# Patient Record
Sex: Female | Born: 1990 | Race: White | Hispanic: No | Marital: Single | Smoking: Never smoker
Health system: Southern US, Community
[De-identification: ages and names within clinical notes are randomized; demographics above are authoritative.]

## PROBLEM LIST (undated history)

## (undated) DIAGNOSIS — T7840XA Allergy, unspecified, initial encounter: Secondary | ICD-10-CM

## (undated) DIAGNOSIS — K219 Gastro-esophageal reflux disease without esophagitis: Secondary | ICD-10-CM

## (undated) DIAGNOSIS — N390 Urinary tract infection, site not specified: Secondary | ICD-10-CM

## (undated) DIAGNOSIS — B019 Varicella without complication: Secondary | ICD-10-CM

## (undated) DIAGNOSIS — F32A Depression, unspecified: Secondary | ICD-10-CM

## (undated) DIAGNOSIS — G43109 Migraine with aura, not intractable, without status migrainosus: Secondary | ICD-10-CM

## (undated) DIAGNOSIS — J45909 Unspecified asthma, uncomplicated: Secondary | ICD-10-CM

## (undated) DIAGNOSIS — D509 Iron deficiency anemia, unspecified: Secondary | ICD-10-CM

## (undated) DIAGNOSIS — G610 Guillain-Barre syndrome: Secondary | ICD-10-CM

## (undated) DIAGNOSIS — R519 Headache, unspecified: Secondary | ICD-10-CM

## (undated) DIAGNOSIS — F419 Anxiety disorder, unspecified: Secondary | ICD-10-CM

## (undated) DIAGNOSIS — F5089 Other specified eating disorder: Secondary | ICD-10-CM

## (undated) HISTORY — DX: Headache, unspecified: R51.9

## (undated) HISTORY — DX: Gastro-esophageal reflux disease without esophagitis: K21.9

## (undated) HISTORY — PX: CHOLECYSTECTOMY: SHX55

## (undated) HISTORY — DX: Allergy, unspecified, initial encounter: T78.40XA

## (undated) HISTORY — DX: Migraine with aura, not intractable, without status migrainosus: G43.109

## (undated) HISTORY — DX: Depression, unspecified: F32.A

## (undated) HISTORY — PX: APPENDECTOMY: SHX54

## (undated) HISTORY — DX: Anxiety disorder, unspecified: F41.9

## (undated) HISTORY — PX: TONSILLECTOMY: SUR1361

## (undated) HISTORY — PX: WISDOM TOOTH EXTRACTION: SHX21

## (undated) HISTORY — DX: Urinary tract infection, site not specified: N39.0

## (undated) HISTORY — PX: OTHER SURGICAL HISTORY: SHX169

## (undated) HISTORY — DX: Varicella without complication: B01.9

---

## 2013-02-04 DIAGNOSIS — L309 Dermatitis, unspecified: Secondary | ICD-10-CM | POA: Insufficient documentation

## 2017-09-28 DIAGNOSIS — G610 Guillain-Barre syndrome: Secondary | ICD-10-CM | POA: Insufficient documentation

## 2017-10-15 DIAGNOSIS — J45901 Unspecified asthma with (acute) exacerbation: Secondary | ICD-10-CM

## 2017-10-15 DIAGNOSIS — J45909 Unspecified asthma, uncomplicated: Secondary | ICD-10-CM | POA: Insufficient documentation

## 2017-10-15 HISTORY — DX: Unspecified asthma with (acute) exacerbation: J45.901

## 2017-12-27 DIAGNOSIS — Z87798 Personal history of other (corrected) congenital malformations: Secondary | ICD-10-CM | POA: Insufficient documentation

## 2017-12-27 HISTORY — DX: Personal history of other (corrected) congenital malformations: Z87.798

## 2018-02-26 DIAGNOSIS — F988 Other specified behavioral and emotional disorders with onset usually occurring in childhood and adolescence: Secondary | ICD-10-CM | POA: Insufficient documentation

## 2018-03-17 DIAGNOSIS — F5083 Pica in adults: Secondary | ICD-10-CM

## 2018-03-17 DIAGNOSIS — F5089 Other specified eating disorder: Secondary | ICD-10-CM | POA: Insufficient documentation

## 2018-03-17 HISTORY — DX: Pica in adults: F50.83

## 2021-07-14 ENCOUNTER — Encounter (HOSPITAL_COMMUNITY): Payer: Self-pay

## 2021-07-14 ENCOUNTER — Emergency Department (HOSPITAL_COMMUNITY)
Admission: EM | Admit: 2021-07-14 | Discharge: 2021-07-15 | Disposition: A | Discharge status: Home / Self Care | Payer: Medicaid Other | Attending: Emergency Medicine | Admitting: Emergency Medicine

## 2021-07-14 ENCOUNTER — Other Ambulatory Visit: Payer: Self-pay

## 2021-07-14 DIAGNOSIS — J45909 Unspecified asthma, uncomplicated: Secondary | ICD-10-CM | POA: Diagnosis not present

## 2021-07-14 DIAGNOSIS — R531 Weakness: Secondary | ICD-10-CM | POA: Diagnosis not present

## 2021-07-14 DIAGNOSIS — R202 Paresthesia of skin: Secondary | ICD-10-CM | POA: Insufficient documentation

## 2021-07-14 DIAGNOSIS — R519 Headache, unspecified: Secondary | ICD-10-CM | POA: Insufficient documentation

## 2021-07-14 DIAGNOSIS — R42 Dizziness and giddiness: Secondary | ICD-10-CM | POA: Insufficient documentation

## 2021-07-14 HISTORY — DX: Guillain-Barre syndrome: G61.0

## 2021-07-14 HISTORY — DX: Unspecified asthma, uncomplicated: J45.909

## 2021-07-14 NOTE — ED Triage Notes (Signed)
Patient arrived with complaints of bilateral leg tingling, nausea, headache and dizziness since 5pm. States she started a new birth control today and is unsure if it is related.

## 2021-07-15 ENCOUNTER — Emergency Department (HOSPITAL_COMMUNITY): Payer: Medicaid Other

## 2021-07-15 LAB — COMPREHENSIVE METABOLIC PANEL
ALT: 18 U/L (ref 0–44)
AST: 16 U/L (ref 15–41)
Albumin: 4 g/dL (ref 3.5–5.0)
Alkaline Phosphatase: 51 U/L (ref 38–126)
Anion gap: 9 (ref 5–15)
BUN: 15 mg/dL (ref 6–20)
CO2: 25 mmol/L (ref 22–32)
Calcium: 9.5 mg/dL (ref 8.9–10.3)
Chloride: 102 mmol/L (ref 98–111)
Creatinine, Ser: 0.92 mg/dL (ref 0.44–1.00)
GFR, Estimated: >60 mL/min (ref >60)
Glucose, Bld: 102 mg/dL — ABNORMAL HIGH (ref 70–99)
Potassium: 3.6 mmol/L (ref 3.5–5.1)
Sodium: 136 mmol/L (ref 135–145)
Total Bilirubin: 0.4 mg/dL (ref 0.3–1.2)
Total Protein: 7.2 g/dL (ref 6.5–8.1)

## 2021-07-15 LAB — CBC WITH DIFFERENTIAL/PLATELET
Abs Immature Granulocytes: 0.03 10*3/uL (ref 0.00–0.07)
Basophils Absolute: 0 10*3/uL (ref 0.0–0.1)
Basophils Relative: 0 %
Eosinophils Absolute: 0.4 10*3/uL (ref 0.0–0.5)
Eosinophils Relative: 5 %
HCT: 42.2 % (ref 36.0–46.0)
Hemoglobin: 13.9 g/dL (ref 12.0–15.0)
Immature Granulocytes: 0 %
Lymphocytes Relative: 29 %
Lymphs Abs: 2.5 10*3/uL (ref 0.7–4.0)
MCH: 27.9 pg (ref 26.0–34.0)
MCHC: 32.9 g/dL (ref 30.0–36.0)
MCV: 84.6 fL (ref 80.0–100.0)
Monocytes Absolute: 0.6 10*3/uL (ref 0.1–1.0)
Monocytes Relative: 6 %
Neutro Abs: 5.2 10*3/uL (ref 1.7–7.7)
Neutrophils Relative %: 60 %
Platelets: 223 10*3/uL (ref 150–400)
RBC: 4.99 MIL/uL (ref 3.87–5.11)
RDW: 13.7 % (ref 11.5–15.5)
WBC: 8.8 10*3/uL (ref 4.0–10.5)
nRBC: 0 % (ref 0.0–0.2)

## 2021-07-15 LAB — I-STAT BETA HCG BLOOD, ED (MC, WL, AP ONLY): I-stat hCG, quantitative: <5 m[IU]/mL (ref <5)

## 2021-07-15 LAB — TROPONIN I (HIGH SENSITIVITY): Troponin I (High Sensitivity): <2 ng/L (ref <18)

## 2021-07-15 MED ORDER — DIPHENHYDRAMINE HCL 50 MG/ML IJ SOLN
25.0000 mg | Freq: Once | INTRAMUSCULAR | Status: AC
  Administered 2021-07-15: 25 mg via INTRAVENOUS
  Filled 2021-07-15: qty 1

## 2021-07-15 MED ORDER — METOCLOPRAMIDE HCL 5 MG/ML IJ SOLN
10.0000 mg | Freq: Once | INTRAMUSCULAR | Status: AC
  Administered 2021-07-15: 10 mg via INTRAVENOUS
  Filled 2021-07-15: qty 2

## 2021-07-15 MED ORDER — KETOROLAC TROMETHAMINE 30 MG/ML IJ SOLN
30.0000 mg | Freq: Once | INTRAMUSCULAR | Status: AC
  Administered 2021-07-15: 30 mg via INTRAVENOUS
  Filled 2021-07-15: qty 1

## 2021-07-15 NOTE — ED Provider Notes (Signed)
[facility]-EMERGENCY DEPT Provider Note   CSN: [id number] Arrival date & time: 07/14/21  2313     History Chief Complaint  Patient presents with   Dizziness    Holly Nielsen is a 30 y.o. female.  Patient with history of asthma, iron deficiency anemia, Guillian-Barre syndrome here with multiple complaints.  Reports he was well until 5 PM today when she developed a gradual onset posterior headache associate with nausea, room spinning dizziness and bilateral numbness and tingling in her legs.  States everything happened at once.  Denies any fall or injury.  Denies thunderclap onset.  Denies any fevers, chills, nausea or vomiting.  States her legs feel heavy and they both are tingling and numb all the way from her hip to her ankle.  No weakness in her arms.  No difficulty speaking or difficulty swallowing.  No chest pain or shortness of breath.  Had some blurry vision earlier but that has since improved.  She started a new birth control patch today and is not sure if this is related.  This does not feel like the last time she had Guillain-Barr syndrome. She denies any bowel or bladder incontinence.  No neck or back pain.  No fevers or chills.  No recent illnesses.  States everything was fine prior to 5 PM tonight. Denies any history of cancer or IV drug abuse.  The history is provided by the patient.  Dizziness Associated symptoms: headaches and weakness   Associated symptoms: no chest pain, no nausea, no shortness of breath and no vomiting       Past Medical History:  Diagnosis Date   Asthma    Guillain-Barre (HCC)     There are no problems to display for this patient.   History reviewed. No pertinent surgical history.   OB History   No obstetric history on file.     History reviewed. No pertinent family history.     Home Medications Prior to Admission medications   Not on File    Allergies    Patient has no known allergies.  Review of  Systems   Review of Systems  Constitutional:  Negative for activity change, appetite change, fatigue and fever.  HENT:  Negative for congestion and rhinorrhea.   Eyes:  Negative for photophobia and visual disturbance.  Respiratory:  Negative for cough, chest tightness and shortness of breath.   Cardiovascular:  Negative for chest pain.  Gastrointestinal:  Negative for abdominal pain, nausea and vomiting.  Genitourinary:  Negative for dysuria, flank pain and hematuria.  Musculoskeletal:  Positive for arthralgias and myalgias.  Skin:  Negative for wound.  Neurological:  Positive for dizziness, weakness, light-headedness, numbness and headaches.   all other systems are negative except as noted in the HPI and PMH.   Physical Exam Updated Vital Signs BP (!) 142/109 (BP Location: Left Arm)   Pulse 84   Temp 98.5 F (36.9 C) (Oral)   Resp 19   Ht 5\' 5"  (1.651 m)   Wt 108.9 kg   SpO2 96%   BMI 39.94 kg/m   Physical Exam Vitals and nursing note reviewed.  Constitutional:      General: She is not in acute distress.    Appearance: Normal appearance. She is well-developed and normal weight. She is not ill-appearing.  HENT:     Head: Normocephalic and atraumatic.     Mouth/Throat:     Mouth: Mucous membranes are moist.     Pharynx: No oropharyngeal exudate.  Eyes:     Conjunctiva/sclera: Conjunctivae normal.     Pupils: Pupils are equal, round, and reactive to light.     Comments: No nystagmus.  No ataxia on finger-to-nose  Neck:     Comments: No meningismus. Cardiovascular:     Rate and Rhythm: Normal rate and regular rhythm.     Heart sounds: Normal heart sounds. No murmur heard. Pulmonary:     Effort: Pulmonary effort is normal. No respiratory distress.     Breath sounds: Normal breath sounds.  Chest:     Chest wall: No tenderness.  Abdominal:     Palpations: Abdomen is soft.     Tenderness: There is no abdominal tenderness. There is no guarding or rebound.   Musculoskeletal:        General: No tenderness. Normal range of motion.     Cervical back: Normal range of motion and neck supple.     Comments: 5/5 strength in bilateral lower extremities. Ankle plantar and dorsiflexion intact. Great toe extension intact bilaterally. +2 DP and PT pulses. +2 patellar reflexes bilaterally. Normal gait. Subjective decreased sensation to the legs bilaterally  Skin:    General: Skin is warm.  Neurological:     Mental Status: She is alert and oriented to person, place, and time.     Cranial Nerves: No cranial nerve deficit.     Motor: No abnormal muscle tone.     Coordination: Coordination normal.     Comments:  5/5 strength throughout. CN 2-12 intact.Equal grip strength.   Psychiatric:        Behavior: Behavior normal.    ED Results / Procedures / Treatments   Labs (all labs ordered are listed, but only abnormal results are displayed) Labs Reviewed  COMPREHENSIVE METABOLIC PANEL - Abnormal; Notable for the following components:      Result Value   Glucose, Bld 102 (*)    All other components within normal limits  CBC WITH DIFFERENTIAL/PLATELET  I-STAT BETA HCG BLOOD, ED ([facility], [facility], [facility] ONLY)  TROPONIN I (HIGH SENSITIVITY)  TROPONIN I (HIGH SENSITIVITY)    EKG EKG Interpretation  Date/Time:  Monday July 15 2021 00:50:14 EDT Ventricular Rate:  90 PR Interval:  134 QRS Duration: 86 QT Interval:  398 QTC Calculation: 486 R Axis:   66 Text Interpretation: Normal sinus rhythm Nonspecific ST and T wave abnormality Prolonged QT Abnormal ECG No previous ECGs available Confirmed by Glynn Octave [phone]) on 07/15/2021 12:59:02 AM  Radiology CT Head Wo Contrast  Result Date: 07/15/2021 CLINICAL DATA:  Acute neurologic deficit, bilateral lower extremity paresthesia, headache EXAM: CT HEAD WITHOUT CONTRAST TECHNIQUE: Contiguous axial images were obtained from the base of the skull through the vertex without intravenous contrast. COMPARISON:  None.  FINDINGS: Brain: Normal anatomic configuration. No abnormal intra or extra-axial mass lesion or fluid collection. No abnormal mass effect or midline shift. No evidence of acute intracranial hemorrhage or infarct. Ventricular size is normal. Cerebellum unremarkable. Vascular: Unremarkable Skull: Intact Sinuses/Orbits: Paranasal sinuses are clear. Orbits are unremarkable. Other: Mastoid air cells and middle ear cavities are clear. IMPRESSION: No acute intracranial abnormality.  Normal exam. Electronically Signed   By: Helyn Numbers MD   On: 07/15/2021 00:34    Procedures Procedures   Medications Ordered in ED Medications  metoCLOPramide (REGLAN) injection 10 mg (has no administration in time range)  diphenhydrAMINE (BENADRYL) injection 25 mg (has no administration in time range)    ED Course  I have reviewed the triage vital signs and  the nursing notes.  Pertinent labs & imaging results that were available during my care of the patient were reviewed by me and considered in my medical decision making (see chart for details).    MDM Rules/Calculators/A&P                         Gradual onset headache associated with nausea, dizziness, bilateral numbness and tingling in her legs since 5 PM.  She has equal strength throughout without appreciable neurodeficits.  Equal strength, sensation, reflexes and pulses.  CT head is negative.  Headache is improved with migraine cocktail.  Low suspicion for subarachnoid hemorrhage, meningitis, temporal arteritis.  Bilateral tingling in legs discussed with neurology.  Discussed with Dr. Otelia Limes of neurology.  He states MS is a possibility.  Guillain Barre syndrome seems unlikely with good strength, reflexes and pulses.  He recommends MRI of her thoracic spine.  Does not need cervical spine without arm involvement. Does not need lumbar spine without perineal symptoms.   Patient declines transfer to [facility] for MRI.  She also declines waiting in the ED  until 7 AM for MRI dislocation.  States she feels ready to go home.  She understands she will be leaving against medical advice because acute neurological pathology such as Guillain-Barr syndrome or multiple sclerosis or other spinal cord pathology has not been ruled out. She is visiting from [place] and has a neurologist there.  States that she just wants to go home and is feeling better.  She declines waiting for MRI and states she will return if worse.  She has appears to have capacity to make this decision. Final Clinical Impression(s) / ED Diagnoses Final diagnoses:  Acute nonintractable headache, unspecified headache type  Bilateral leg paresthesia    Rx / DC Orders ED Discharge Orders     None        Appollonia Klee, Jeannett Senior, MD 07/15/21 [phone]

## 2021-07-15 NOTE — Discharge Instructions (Addendum)
You are leaving AGAINST MEDICAL ADVICE.  The neurologist recommended MRI today which you decided against.  Return to the ED if you wish to be reevaluated.  Follow-up with your neurologist.

## 2021-07-15 NOTE — ED Notes (Signed)
Family at bedside. 

## 2021-09-16 ENCOUNTER — Emergency Department (HOSPITAL_COMMUNITY)
Admission: EM | Admit: 2021-09-16 | Discharge: 2021-09-16 | Disposition: A | Discharge status: Home / Self Care | Payer: Medicaid Other | Attending: Emergency Medicine | Admitting: Emergency Medicine

## 2021-09-16 ENCOUNTER — Encounter (HOSPITAL_COMMUNITY): Payer: Self-pay | Admitting: Emergency Medicine

## 2021-09-16 ENCOUNTER — Other Ambulatory Visit: Payer: Self-pay

## 2021-09-16 ENCOUNTER — Emergency Department (HOSPITAL_COMMUNITY): Payer: Medicaid Other

## 2021-09-16 DIAGNOSIS — E041 Nontoxic single thyroid nodule: Secondary | ICD-10-CM

## 2021-09-16 DIAGNOSIS — N912 Amenorrhea, unspecified: Secondary | ICD-10-CM

## 2021-09-16 DIAGNOSIS — Z7951 Long term (current) use of inhaled steroids: Secondary | ICD-10-CM | POA: Insufficient documentation

## 2021-09-16 DIAGNOSIS — Z28310 Unvaccinated for covid-19: Secondary | ICD-10-CM | POA: Insufficient documentation

## 2021-09-16 DIAGNOSIS — F419 Anxiety disorder, unspecified: Secondary | ICD-10-CM | POA: Insufficient documentation

## 2021-09-16 DIAGNOSIS — J4541 Moderate persistent asthma with (acute) exacerbation: Secondary | ICD-10-CM | POA: Diagnosis not present

## 2021-09-16 DIAGNOSIS — F32A Depression, unspecified: Secondary | ICD-10-CM | POA: Insufficient documentation

## 2021-09-16 DIAGNOSIS — R0602 Shortness of breath: Secondary | ICD-10-CM | POA: Diagnosis present

## 2021-09-16 DIAGNOSIS — Z862 Personal history of diseases of the blood and blood-forming organs and certain disorders involving the immune mechanism: Secondary | ICD-10-CM | POA: Insufficient documentation

## 2021-09-16 HISTORY — DX: Nontoxic single thyroid nodule: E04.1

## 2021-09-16 HISTORY — DX: Amenorrhea, unspecified: N91.2

## 2021-09-16 MED ORDER — ALBUTEROL SULFATE HFA 108 (90 BASE) MCG/ACT IN AERS
2.0000 | INHALATION_SPRAY | Freq: Once | RESPIRATORY_TRACT | Status: AC
  Administered 2021-09-16: 2 via RESPIRATORY_TRACT

## 2021-09-16 MED ORDER — ALBUTEROL SULFATE HFA 108 (90 BASE) MCG/ACT IN AERS
2.0000 | INHALATION_SPRAY | Freq: Once | RESPIRATORY_TRACT | Status: DC
  Filled 2021-09-16: qty 6.7

## 2021-09-16 MED ORDER — METHYLPREDNISOLONE SODIUM SUCC 125 MG IJ SOLR
125.0000 mg | Freq: Once | INTRAMUSCULAR | Status: AC
  Administered 2021-09-16: 125 mg via INTRAVENOUS
  Filled 2021-09-16: qty 2

## 2021-09-16 MED ORDER — PREDNISONE 20 MG PO TABS: 20.0000 mg | ORAL_TABLET | Freq: Two times a day (BID) | ORAL | 0 refills | Status: DC

## 2021-09-16 NOTE — ED Notes (Signed)
D/c paperwork reviewed with pt, including prescriptions. Pt requesting work note, which was provided to her. No further needs expressed, ambulatory to ED exit.

## 2021-09-16 NOTE — Progress Notes (Signed)
[id number]/following for any medication assistance needed.

## 2021-09-16 NOTE — Discharge Instructions (Addendum)
Follow-up with the doctor of your choice as needed for problems.  Use the albuterol inhaler 2 puffs every 3-4 hours as needed for cough or trouble breathing.

## 2021-09-16 NOTE — ED Triage Notes (Signed)
Pt BIBA from home-  Pt c/o ShOB starting last night. Pt used home inhalers and breathing tx with no improvement.   EMS gave duoneb x2 PTA.  100% RA on arrival to ED.

## 2021-09-16 NOTE — ED Notes (Signed)
Pt provided [place] sandwich and soda, per her request. Updated to plan of care. No further needs expressed at this time.

## 2021-09-16 NOTE — ED Provider Notes (Signed)
[facility]-EMERGENCY DEPT Provider Note   CSN: [id number] Arrival date & time: 09/16/21  2500     History Chief Complaint  Patient presents with   Shortness of Breath    Holly Nielsen is a 30 y.o. female.  HPI She presents for evaluation of shortness of breath despite using usual inhalers, and nebulizer, at home.  She states she used her last dose of nebulizer and inhaler, prior to coming here.  She has recently moved to [place], from [place], and does not have insurance that she can use in [place].  She plans on going to [place] for a court date, in 3 days.  She denies fever, chills, chest pain, weakness or dizziness.  She has not had COVID vaccines because of her history of Guillain-Barr syndrome.  She states that she has had COVID, 2 different times.  There are no other known active modifying factors.     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   OB History   No obstetric history on file.     No family history on file.  Social History   Tobacco Use   Smoking status: Never   Smokeless tobacco: Never    Home Medications Prior to Admission medications   Medication Sig Start Date End Date Taking? Authorizing Provider  predniSONE (DELTASONE) 20 MG tablet Take 1 tablet (20 mg total) by mouth 2 (two) times daily. 09/16/21  Yes Mancel Bale, MD  albuterol (PROVENTIL) (2.5 MG/3ML) 0.083%  nebulizer solution Take 3 mLs by nebulization every 6 (six) hours. 07/08/21   [provider]  albuterol (VENTOLIN HFA) 108 (90 Base) MCG/ACT inhaler Inhale 2 puffs into the lungs every 6 (six) hours as needed for wheezing. 02/07/19   [provider]  EPINEPHrine 0.3 mg/0.3 mL IJ SOAJ injection Inject into the muscle daily. 07/08/21   [provider]  fluticasone-salmeterol (ADVAIR) 250-50 MCG/ACT AEPB Inhale 1 puff into the lungs in the morning and at bedtime.    [provider]  naproxen sodium (ALEVE) 220 MG tablet Take 220 mg by mouth daily as needed.    [provider]  PARoxetine (PAXIL-CR) 12.5 MG 24 hr tablet Take 12.5 mg by mouth every morning. 07/08/21   [provider]  Burr Medico 150-35 MCG/24HR transdermal patch Place 1 patch onto the skin once a week. Sunday 07/08/21   [provider]    Allergies    Celery oil, Corn oil, Iron sucrose, Lac bovis, Nutricap [actical], Onion, Other, Pedi-pre tape spray [wound dressing adhesive], and Vanilla  Review of Systems   Review of Systems  All other systems reviewed and are negative.  Physical Exam Updated Vital Signs BP 122/77   Pulse 87   Temp 98.3 F (36.8 C) (Oral)   Resp 17   Ht 5' (1.524 m)   Wt 113.4 kg   LMP 09/02/2021 (Exact Date)   SpO2 96%  BMI 48.82 kg/m   Physical Exam Vitals and nursing note reviewed.  Constitutional:      General: She is not in acute distress.    Appearance: She is well-developed. She is not ill-appearing, toxic-appearing or diaphoretic.  HENT:     Head: Normocephalic and atraumatic.     Nose: No congestion or rhinorrhea.     Mouth/Throat:     Pharynx: No oropharyngeal exudate or posterior oropharyngeal erythema.  Eyes:     Conjunctiva/sclera: Conjunctivae normal.     Pupils: Pupils are equal, round, and reactive to light.  Neck:     Trachea: Phonation normal.  Cardiovascular:     Rate and Rhythm: Normal rate and regular rhythm.   Pulmonary:     Effort: Pulmonary effort is normal. No respiratory distress.     Breath sounds: No stridor. Wheezing present.  Chest:     Chest wall: No tenderness.  Abdominal:     General: There is no distension.     Palpations: Abdomen is soft.     Tenderness: There is no abdominal tenderness. There is no guarding.  Musculoskeletal:        General: Normal range of motion.     Cervical back: Normal range of motion and neck supple.  Skin:    General: Skin is warm and dry.  Neurological:     Mental Status: She is alert and oriented to person, place, and time.     Motor: No abnormal muscle tone.  Psychiatric:        Mood and Affect: Mood normal.        Behavior: Behavior normal.        Thought Content: Thought content normal.        Judgment: Judgment normal.    ED Results / Procedures / Treatments   Labs (all labs ordered are listed, but only abnormal results are displayed) Labs Reviewed - No data to display  EKG EKG Interpretation  Date/Time:  Monday September 16 2021 07:44:45 EDT Ventricular Rate:  79 PR Interval:  139 QRS Duration: 107 QT Interval:  433 QTC Calculation: 497 R Axis:   13 Text Interpretation: Sinus rhythm Low voltage, precordial leads Borderline T abnormalities, diffuse leads Prolonged QT interval since last tracing no significant change Confirmed by Mancel Bale [phone]) on 09/16/2021 9:47:01 AM  Radiology DG Chest 2 View  Result Date: 09/16/2021 CLINICAL DATA:  Shortness of breath EXAM: CHEST - 2 VIEW COMPARISON:  None. FINDINGS: The cardiomediastinal silhouette is normal. There is mild central peribronchial thickening. There is no focal consolidation or pulmonary edema. There is no pleural effusion or pneumothorax. There is no acute osseous abnormality. IMPRESSION: Central peribronchial thickening can be seen with viral infection or reactive airway disease. No focal consolidation or pleural effusion. Electronically Signed   By: Lesia Hausen M.D.   On:  09/16/2021 08:17    Procedures Procedures   Medications Ordered in ED Medications  albuterol (VENTOLIN HFA) 108 (90 Base) MCG/ACT inhaler 2 puff (has no administration in time range)  methylPREDNISolone sodium succinate (SOLU-MEDROL) 125 mg/2 mL injection 125 mg (125 mg Intravenous Given 09/16/21 1027)  albuterol (VENTOLIN HFA) 108 (90 Base) MCG/ACT inhaler 2 puff (2 puffs Inhalation Given 09/16/21 1546)    ED Course  I have reviewed the triage vital signs and the nursing notes.  Pertinent labs & imaging results that were available during my care of the patient were reviewed by me and considered in my medical decision making (see chart for details).  MDM Rules/Calculators/A&P                            Patient Vitals for the past 24 hrs:  BP Temp Temp src Pulse Resp SpO2 Height Weight  09/16/21 1400 122/77 -- -- 87 17 96 % -- --  09/16/21 1300 124/72 -- -- 83 17 95 % -- --  09/16/21 1200 (!) 132/91 -- -- 76 16 99 % -- --  09/16/21 1100 125/87 -- -- 88 (!) 21 96 % -- --  09/16/21 0900 128/87 -- -- 77 14 99 % -- --  09/16/21 0745 128/82 98.3 F (36.8 C) Oral 76 13 97 % -- --  09/16/21 0739 -- -- -- -- -- -- 5' (1.524 m) 113.4 kg  09/16/21 0734 -- -- -- -- -- 100 % -- --    3:45 PM Reevaluation with update and discussion. After initial assessment and treatment, an updated evaluation reveals no further complaints she is comfortable.  Vital signs normal.  Patient was evaluated by social work for assistance with medication.  Unfortunately she does not meet criteria for medication assistance because she has support in [place].  Patient is encouraged to go back there for assistance with her medications.  Findings discussed and questions answered. Mancel Bale   Medical Decision Making:  This patient is presenting for evaluation of shortness of breath, which does require a range of treatment options, and is a complaint that involves a moderate risk of morbidity and mortality. The  differential diagnoses include asthma, bronchitis, pneumonia, viral. I decided to review old records, and in summary millage female with ongoing respiratory distress, and history of anemia.  She is challenged by lack of resources to buy medications and no insurance that she can use in the state of [place].  I did not require additional historical information from anyone.  Radiologic Tests Ordered, included chest x-ray.  I independently Visualized: Radiograph images, which show no infiltrate or edema  Cardiac Monitor Tracing which shows normal sinus rhythm     Critical Interventions-clinical evaluation, medication treatment, radiography, observation and reassessment.  TOC consultation.  After These Interventions, the Patient was reevaluated and was found stable for discharge.  No evidence for acute unstable respiratory disorder.  Chronic stable angina, with challenges getting medications due to living in a new state.  She has not required hospitalization.  Chest x-ray does not show pneumonia.  CRITICAL CARE-no Performed by: Mancel Bale  Nursing Notes Reviewed/ Care Coordinated Applicable Imaging Reviewed Interpretation of Laboratory Data incorporated into ED treatment  The patient appears reasonably screened and/or stabilized for discharge and I doubt any other medical condition or other [facility] requiring further screening, evaluation, or treatment in the ED at this time prior to discharge.  Plan: Home Medications-continue usual; Home Treatments-rest, fluids; return here if the recommended treatment, does not improve the symptoms; Recommended follow up-PCP, as needed     Final Clinical Impression(s) / ED Diagnoses Final diagnoses:  Moderate persistent asthma with exacerbation    Rx / DC Orders ED Discharge Orders          Ordered    predniSONE (DELTASONE) 20 MG tablet  2 times daily        09/16/21 1541             Mancel Bale, MD 09/16/21 1547

## 2021-11-11 ENCOUNTER — Emergency Department (HOSPITAL_COMMUNITY): Payer: Medicaid Other

## 2021-11-11 ENCOUNTER — Emergency Department (HOSPITAL_COMMUNITY)
Admission: EM | Admit: 2021-11-11 | Discharge: 2021-11-11 | Disposition: A | Discharge status: Home / Self Care | Payer: Medicaid Other | Attending: Emergency Medicine | Admitting: Emergency Medicine

## 2021-11-11 ENCOUNTER — Other Ambulatory Visit: Payer: Self-pay

## 2021-11-11 ENCOUNTER — Encounter (HOSPITAL_COMMUNITY): Payer: Self-pay | Admitting: Emergency Medicine

## 2021-11-11 DIAGNOSIS — R0789 Other chest pain: Secondary | ICD-10-CM | POA: Diagnosis not present

## 2021-11-11 DIAGNOSIS — J45909 Unspecified asthma, uncomplicated: Secondary | ICD-10-CM | POA: Insufficient documentation

## 2021-11-11 DIAGNOSIS — Z20822 Contact with and (suspected) exposure to covid-19: Secondary | ICD-10-CM | POA: Insufficient documentation

## 2021-11-11 DIAGNOSIS — Z7951 Long term (current) use of inhaled steroids: Secondary | ICD-10-CM | POA: Diagnosis not present

## 2021-11-11 DIAGNOSIS — R059 Cough, unspecified: Secondary | ICD-10-CM | POA: Insufficient documentation

## 2021-11-11 DIAGNOSIS — M791 Myalgia, unspecified site: Secondary | ICD-10-CM | POA: Diagnosis not present

## 2021-11-11 DIAGNOSIS — J069 Acute upper respiratory infection, unspecified: Secondary | ICD-10-CM

## 2021-11-11 DIAGNOSIS — R509 Fever, unspecified: Secondary | ICD-10-CM | POA: Diagnosis present

## 2021-11-11 LAB — RESP PANEL BY RT-PCR (FLU A&B, COVID) ARPGX2
Influenza A by PCR: NEGATIVE
Influenza B by PCR: NEGATIVE
SARS Coronavirus 2 by RT PCR: NEGATIVE

## 2021-11-11 MED ORDER — ALBUTEROL SULFATE HFA 108 (90 BASE) MCG/ACT IN AERS: 2.0000 | INHALATION_SPRAY | Freq: Four times a day (QID) | RESPIRATORY_TRACT | 0 refills | Status: DC | PRN

## 2021-11-11 NOTE — ED Provider Notes (Signed)
Emergency Medicine Provider Triage Evaluation Note  Holly Nielsen , a 30 y.o. female  was evaluated in triage.  Pt complains of muscle aches, cough, fever, chest congestion of 1 day duration.  Review of Systems  Positive: Muscles, cough, fever, chest congestion Negative: Chest pain, shortness of breath  Physical Exam  BP 129/90   Pulse 93   Temp 98.3 F (36.8 C) (Oral)   Resp 18   SpO2 94%  Gen:   Awake, no distress   Resp:  Normal effort  MSK:   Moves extremities without difficulty  Other:    Medical Decision Making  Medically screening exam initiated at 4:44 PM.  Appropriate orders placed.  Holly Nielsen was informed that the remainder of the evaluation will be completed by another provider, this initial triage assessment does not replace that evaluation, and the importance of remaining in the ED until their evaluation is complete.     Marita Kansas, PA-C 11/11/21 1645    Cathren Laine, MD 11/11/21 1659

## 2021-11-11 NOTE — ED Triage Notes (Signed)
Patient presents with shortness of breath, sore throat, cough, body aches, headache, dizziness and chills. Symptoms began last night.

## 2021-11-11 NOTE — Discharge Instructions (Addendum)
Your chest x-ray was without pneumonia.  Your respiratory panel was negative COVID, and flu.  Patient developed shortness of breath or worsening of your symptoms please return to the emergency room.

## 2021-11-11 NOTE — ED Provider Notes (Signed)
[facility]-EMERGENCY DEPT Provider Note   CSN: [id number] Arrival date & time: 11/11/21  1614     History No chief complaint on file.   Holly Nielsen is a 30 y.o. female.  30 year old female presents today for evaluation of 1 day duration of fever, cough, muscle aches, and chest congestion.  She has been taking Tylenol with good relief of her fever.  She is not nauseous or throwing up.  Has been tolerating p.o. intake without difficulty.  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  She is without other complaints at this time.  She does have asthma has not been wheezing.  However she does not have her asthma medications with her as they are in [place] and her grandma is mailing those to her.  The history is provided by the patient. No language interpreter was used.      Past Medical History:  Diagnosis Date  . Asthma   . Guillain-Barre [facility])     Patient Active Problem List   Diagnosis Date Noted  . Morbid obesity with body mass index of 40.0-49.9 (HCC) 09/16/2021  . Amenorrhea 09/16/2021  . Anxiety 09/16/2021  . Cyst of thyroid 09/16/2021  . History of iron deficiency anemia 09/16/2021  . 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  . ADD (attention deficit disorder) 02/26/2018  . H/O removal of thyroglossal duct cyst 12/27/2017  . Unspecified asthma with (acute) exacerbation 10/15/2017  . Guillain-Barre syndrome (HCC) 09/28/2017    History reviewed. No pertinent surgical history.   OB History   No obstetric history on file.     History reviewed. No pertinent family history.  Social History   Tobacco Use  . Smoking status: Never  . Smokeless tobacco: Never    Home Medications Prior to Admission medications   Medication Sig Start Date End Date Taking? Authorizing Provider  albuterol (PROVENTIL) (2.5 MG/3ML) 0.083% nebulizer solution Take 3 mLs by nebulization every 6 (six) hours. 07/08/21   [provider]   albuterol (VENTOLIN HFA) 108 (90 Base) MCG/ACT inhaler Inhale 2 puffs into the lungs every 6 (six) hours as needed for wheezing. 02/07/19   [provider]  EPINEPHrine 0.3 mg/0.3 mL IJ SOAJ injection Inject into the muscle daily. 07/08/21   [provider]  fluticasone-salmeterol (ADVAIR) 250-50 MCG/ACT AEPB Inhale 1 puff into the lungs in the morning and at bedtime.    [provider]  naproxen sodium (ALEVE) 220 MG tablet Take 220 mg by mouth daily as needed.    [provider]  PARoxetine (PAXIL-CR) 12.5 MG 24 hr tablet Take 12.5 mg by mouth every morning. 07/08/21   [provider]  predniSONE (DELTASONE) 20 MG tablet Take 1 tablet (20 mg total) by mouth 2 (two) times daily. 09/16/21   Mancel Bale, MD  Burr Medico 150-35 MCG/24HR transdermal patch Place 1 patch onto the skin once a week. Sunday 07/08/21   [provider]    Allergies    Celery oil, Corn oil, Iron sucrose, Lac bovis, Nutricap [actical], Onion, Other, Pedi-pre tape spray [wound dressing adhesive], and Vanilla  Review of Systems   Review of Systems  Constitutional:  Negative for activity change, chills and fever.  HENT:  Positive for congestion and sinus pain.   Respiratory:  Positive for cough. Negative for chest tightness and shortness of breath.   Cardiovascular:  Negative for chest pain.  Gastrointestinal:  Negative for abdominal pain, nausea and vomiting.  Genitourinary:  Negative for  decreased urine volume.  Musculoskeletal:  Positive for myalgias.  Neurological:  Negative for weakness and light-headedness.  All other systems reviewed and are negative.  Physical Exam Updated Vital Signs BP 106/82   Pulse 94   Temp 98.3 F (36.8 C) (Oral)   Resp 18   SpO2 94%   Physical Exam Vitals and nursing note reviewed.  Constitutional:      General: She is not in acute distress.    Appearance: Normal appearance. She is not ill-appearing.  HENT:     Head:  Normocephalic and atraumatic.     Nose: Nose normal.  Eyes:     Conjunctiva/sclera: Conjunctivae normal.  Cardiovascular:     Rate and Rhythm: Normal rate and regular rhythm.  Pulmonary:     Effort: Pulmonary effort is normal. No respiratory distress.     Breath sounds: Normal breath sounds. No wheezing or rales.  Abdominal:     General: There is no distension.     Tenderness: There is no abdominal tenderness. There is no guarding.  Musculoskeletal:        General: No deformity.  Skin:    Findings: No rash.  Neurological:     Mental Status: She is alert.    ED Results / Procedures / Treatments   Labs (all labs ordered are listed, but only abnormal results are displayed) Labs Reviewed  RESP PANEL BY RT-PCR (FLU A&B, COVID) ARPGX2    EKG None  Radiology DG Chest Portable 1 View  Result Date: 11/11/2021 CLINICAL DATA:  Productive cough and fever. EXAM: PORTABLE CHEST 1 VIEW COMPARISON:  Chest x-ray 09/16/2021. FINDINGS: The heart size and mediastinal contours are within normal limits. Both lungs are clear. The visualized skeletal structures are unremarkable. IMPRESSION: No active disease. Electronically Signed   By: Darliss Cheney M.D.   On: 11/11/2021 17:00    Procedures Procedures   Medications Ordered in ED Medications - No data to display  ED Course  I have reviewed the triage vital signs and the nursing notes.  Pertinent labs & imaging results that were available during my care of the patient were reviewed by me and considered in my medical decision making (see chart for details).    MDM Rules/Calculators/A&P                           30 year old female presents today for evaluation of flulike symptoms.  Patient's flu, COVID test was negative.  Patient sounds are clear to auscultation bilaterally.  Patient is appropriate for discharge.  We will send in albuterol inhaler for patient.  Return precautions discussed.  Patient voices understanding and is in agreement  with plan.  Final Clinical Impression(s) / ED Diagnoses Final diagnoses:  None    Rx / DC Orders ED Discharge Orders     None        Marita Kansas, PA-C 11/11/21 1807    Bethann Berkshire, MD 11/15/21 [phone]

## 2021-12-04 ENCOUNTER — Other Ambulatory Visit: Payer: Self-pay

## 2021-12-04 ENCOUNTER — Emergency Department (HOSPITAL_COMMUNITY): Payer: Medicaid Other

## 2021-12-04 ENCOUNTER — Emergency Department (HOSPITAL_COMMUNITY)
Admission: EM | Admit: 2021-12-04 | Discharge: 2021-12-04 | Disposition: A | Discharge status: Home / Self Care | Payer: Medicaid Other | Attending: Emergency Medicine | Admitting: Emergency Medicine

## 2021-12-04 ENCOUNTER — Encounter (HOSPITAL_COMMUNITY): Payer: Self-pay | Admitting: Oncology

## 2021-12-04 DIAGNOSIS — B349 Viral infection, unspecified: Secondary | ICD-10-CM | POA: Diagnosis not present

## 2021-12-04 DIAGNOSIS — J029 Acute pharyngitis, unspecified: Secondary | ICD-10-CM | POA: Diagnosis present

## 2021-12-04 DIAGNOSIS — J45909 Unspecified asthma, uncomplicated: Secondary | ICD-10-CM | POA: Diagnosis not present

## 2021-12-04 DIAGNOSIS — Z7951 Long term (current) use of inhaled steroids: Secondary | ICD-10-CM | POA: Diagnosis not present

## 2021-12-04 DIAGNOSIS — Z20822 Contact with and (suspected) exposure to covid-19: Secondary | ICD-10-CM | POA: Diagnosis not present

## 2021-12-04 LAB — RESP PANEL BY RT-PCR (FLU A&B, COVID) ARPGX2
Influenza A by PCR: NEGATIVE
Influenza B by PCR: NEGATIVE
SARS Coronavirus 2 by RT PCR: NEGATIVE

## 2021-12-04 NOTE — ED Provider Notes (Signed)
[facility]-EMERGENCY DEPT Provider Note   CSN: [id number] Arrival date & time: 12/04/21  5093     History Chief Complaint  Patient presents with   Shortness of Breath    Holly Nielsen is a 30 y.o. female.  The history is provided by the patient and medical records. No language interpreter was used.  Shortness of Breath  30 year old female significant history of asthma, obesity, Guillian-Barr syndrome who presents complaining of cold symptoms.  For the last 3 days patient has had chills, congestion, sore throat, productive cough, nausea, having some loose stools, and overall not feeling well.  She has been taking over-the-counter medication for her symptoms with some improvement.  She was at work today but her provider asked for her to go home.  She does use inhaler as needed but not more than usual.  She denies worsening shortness of breath.  Past Medical History:  Diagnosis Date   Asthma    Guillain-Barre [facility])     Patient Active Problem List   Diagnosis Date Noted   Morbid obesity with body mass index of 40.0-49.9 (HCC) 09/16/2021   Amenorrhea 09/16/2021   Anxiety 09/16/2021   Cyst of thyroid 09/16/2021   History of iron deficiency anemia 09/16/2021   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   ADD (attention deficit disorder) 02/26/2018   H/O removal of thyroglossal duct cyst 12/27/2017   Unspecified asthma with (acute) exacerbation 10/15/2017   Guillain-Barre syndrome (HCC) 09/28/2017    History reviewed. No pertinent surgical history.   OB History   No obstetric history on file.     No family history on file.  Social History   Tobacco Use   Smoking status: Never   Smokeless tobacco: Never  Vaping Use   Vaping Use: Never used  Substance Use Topics   Alcohol use: Not Currently   Drug use: Not Currently    Home Medications Prior to Admission medications   Medication Sig Start Date End Date Taking? Authorizing Provider  albuterol (PROVENTIL)  (2.5 MG/3ML) 0.083% nebulizer solution Take 3 mLs by nebulization every 6 (six) hours. 07/08/21   [provider]  albuterol (VENTOLIN HFA) 108 (90 Base) MCG/ACT inhaler Inhale 2 puffs into the lungs every 6 (six) hours as needed for wheezing. 11/11/21   Marita Kansas, PA-C  EPINEPHrine 0.3 mg/0.3 mL IJ SOAJ injection Inject into the muscle daily. 07/08/21   [provider]  fluticasone-salmeterol (ADVAIR) 250-50 MCG/ACT AEPB Inhale 1 puff into the lungs in the morning and at bedtime.    [provider]  naproxen sodium (ALEVE) 220 MG tablet Take 220 mg by mouth daily as needed.    [provider]  PARoxetine (PAXIL-CR) 12.5 MG 24 hr tablet Take 12.5 mg by mouth every morning. 07/08/21   [provider]  predniSONE (DELTASONE) 20 MG tablet Take 1 tablet (20 mg total) by mouth 2 (two) times daily. 09/16/21   Mancel Bale, MD  Burr Medico 150-35 MCG/24HR transdermal patch Place 1 patch onto the skin once a week. Sunday 07/08/21   [provider]    Allergies    Celery oil, Corn oil, Iron sucrose, Lac bovis, Nutricap [actical], Onion, Other, Pedi-pre tape spray [wound dressing adhesive], and Vanilla  Review of Systems   Review of Systems  Respiratory:  Positive for shortness of breath.   All other systems reviewed and are negative.  Physical Exam Updated Vital Signs BP (!) 157/97 (BP Location: Left Arm)   Pulse 94   Temp 97.8 F (  36.6 C) (Oral)   Resp 18   Ht 5' (1.524 m)   Wt 108.9 kg   LMP 11/28/2021 (Exact Date)   SpO2 94%   BMI 46.87 kg/m   Physical Exam Vitals and nursing note reviewed.  Constitutional:      General: She is not in acute distress.    Appearance: She is well-developed. She is obese.  HENT:     Head: Atraumatic.     Mouth/Throat:     Mouth: Mucous membranes are moist.  Eyes:     Conjunctiva/sclera: Conjunctivae normal.  Cardiovascular:     Rate and Rhythm: Normal rate and regular rhythm.  Pulmonary:     Effort:  Pulmonary effort is normal.  Abdominal:     General: Abdomen is flat.     Palpations: Abdomen is soft.     Tenderness: There is no abdominal tenderness.  Musculoskeletal:     Cervical back: Normal range of motion and neck supple.  Skin:    Findings: No rash.  Neurological:     Mental Status: She is alert.  Psychiatric:        Mood and Affect: Mood normal.    ED Results / Procedures / Treatments   Labs (all labs ordered are listed, but only abnormal results are displayed) Labs Reviewed  RESP PANEL BY RT-PCR (FLU A&B, COVID) ARPGX2    EKG None  Radiology DG Chest 2 View  Result Date: 12/04/2021 CLINICAL DATA:  Cough, body aches, fever, chills EXAM: CHEST - 2 VIEW COMPARISON:  Chest radiograph 11/11/2021 FINDINGS: The cardiomediastinal silhouette is stable. There is no focal consolidation or pulmonary edema. There is no pleural effusion or pneumothorax There is no acute osseous abnormality. IMPRESSION: No radiographic evidence of acute cardiopulmonary process. Electronically Signed   By: Lesia Hausen M.D.   On: 12/04/2021 10:13    Procedures Procedures   Medications Ordered in ED Medications - No data to display  ED Course  I have reviewed the triage vital signs and the nursing notes.  Pertinent labs & imaging results that were available during my care of the patient were reviewed by me and considered in my medical decision making (see chart for details).    MDM Rules/Calculators/A&P                           BP (!) 157/97 (BP Location: Left Arm)   Pulse 94   Temp 97.8 F (36.6 C) (Oral)   Resp 18   Ht 5' (1.524 m)   Wt 108.9 kg   LMP 11/28/2021 (Exact Date)   SpO2 94%   BMI 46.87 kg/m   Final Clinical Impression(s) / ED Diagnoses Final diagnoses:  Viral illness    Rx / DC Orders ED Discharge Orders     None      11:27 AM Patient here with cold symptoms.  Patient test negative for COVID and flu chest x-ray without any acute abnormality.  Recommend  symptomatic managements of her flulike symptoms.  But otherwise patient stable for discharge.  Holly Nielsen was evaluated in Emergency Department on 12/04/2021 for the symptoms described in the history of present illness. She was evaluated in the context of the global COVID-19 pandemic, which necessitated consideration that the patient might be at risk for infection with the SARS-CoV-2 virus that causes COVID-19. Institutional protocols and algorithms that pertain to the evaluation of patients at risk for COVID-19 are in a state of rapid change based  on information released by regulatory bodies including the CDC and federal and state organizations. These policies and algorithms were followed during the patient's care in the ED.    Fayrene Helper, PA-C 12/04/21 1129    Vanetta Mulders, MD 12/05/21 1124

## 2021-12-04 NOTE — ED Triage Notes (Signed)
Pt reports shob, cough, generalized body aches, fever, chills x several days.  Pt states she had similar sx last month got better and, "It came back with a vengeance."  Pt speaking in full sentences.

## 2021-12-04 NOTE — ED Notes (Signed)
An After Visit Summary was printed and given to the patient. Discharge instructions given and no further questions at this time.  

## 2021-12-04 NOTE — Discharge Instructions (Addendum)
Your symptoms are likely due to a viral illness.  Continue taking over-the-counter medication as needed for management of your symptoms.  Return if you develop significant shortness of breath or if you have other concern.

## 2022-01-05 ENCOUNTER — Encounter (HOSPITAL_COMMUNITY): Payer: Self-pay

## 2022-01-05 ENCOUNTER — Emergency Department (HOSPITAL_COMMUNITY): Payer: Medicaid Other

## 2022-01-05 ENCOUNTER — Emergency Department (HOSPITAL_COMMUNITY)
Admission: EM | Admit: 2022-01-05 | Discharge: 2022-01-06 | Disposition: A | Discharge status: Home / Self Care | Payer: Medicaid Other | Attending: Emergency Medicine | Admitting: Emergency Medicine

## 2022-01-05 ENCOUNTER — Other Ambulatory Visit: Payer: Self-pay

## 2022-01-05 DIAGNOSIS — J45901 Unspecified asthma with (acute) exacerbation: Secondary | ICD-10-CM

## 2022-01-05 DIAGNOSIS — U071 COVID-19: Secondary | ICD-10-CM | POA: Insufficient documentation

## 2022-01-05 DIAGNOSIS — J4521 Mild intermittent asthma with (acute) exacerbation: Secondary | ICD-10-CM | POA: Diagnosis not present

## 2022-01-05 DIAGNOSIS — R519 Headache, unspecified: Secondary | ICD-10-CM | POA: Diagnosis present

## 2022-01-05 LAB — RESP PANEL BY RT-PCR (FLU A&B, COVID) ARPGX2
Influenza A by PCR: NEGATIVE
Influenza B by PCR: NEGATIVE
SARS Coronavirus 2 by RT PCR: POSITIVE — AB

## 2022-01-05 MED ORDER — IPRATROPIUM-ALBUTEROL 0.5-2.5 (3) MG/3ML IN SOLN
3.0000 mL | Freq: Once | RESPIRATORY_TRACT | Status: AC
  Administered 2022-01-05: 3 mL via RESPIRATORY_TRACT
  Filled 2022-01-05: qty 3

## 2022-01-05 MED ORDER — LACTATED RINGERS IV BOLUS
1000.0000 mL | Freq: Once | INTRAVENOUS | Status: AC
  Administered 2022-01-05: 1000 mL via INTRAVENOUS

## 2022-01-05 MED ORDER — METOCLOPRAMIDE HCL 5 MG/ML IJ SOLN
10.0000 mg | Freq: Once | INTRAMUSCULAR | Status: AC
  Administered 2022-01-05: 10 mg via INTRAVENOUS
  Filled 2022-01-05: qty 2

## 2022-01-05 MED ORDER — DIPHENHYDRAMINE HCL 50 MG/ML IJ SOLN
25.0000 mg | Freq: Once | INTRAMUSCULAR | Status: AC
  Administered 2022-01-05: 25 mg via INTRAVENOUS
  Filled 2022-01-05: qty 1

## 2022-01-05 MED ORDER — ACETAMINOPHEN 500 MG PO TABS
1000.0000 mg | ORAL_TABLET | Freq: Once | ORAL | Status: AC
  Administered 2022-01-05: 1000 mg via ORAL
  Filled 2022-01-05: qty 2

## 2022-01-05 NOTE — ED Provider Notes (Signed)
[facility]   CSN: [id number] Arrival date & time: 01/05/22  2206     History  Chief Complaint  Patient presents with   Headache   Chills   Nausea   Cough   Wheezing    Holly Nielsen is a 31 y.o. female.   Headache Associated symptoms: cough   Cough Associated symptoms: headaches and wheezing   Wheezing Associated symptoms: cough and headaches    31 year old female with a history of asthma who presents to the emergency department with a cough, headache, chills, nausea and wheezing that has been present for 1 day.  The patient states that her significant other was also sick with URI symptoms.  She does not have an inhaler at home currently.  She states that symptoms have been present for the past 1 day.  She denies any chest pain at this time.  She is tolerating oral intake.    Home Medications Prior to Admission medications   Medication Sig Start Date End Date Taking? Authorizing Provider  albuterol (PROVENTIL) (2.5 MG/3ML) 0.083% nebulizer solution Take 3 mLs by nebulization every 6 (six) hours as needed for shortness of breath. 07/08/21  Yes [provider]  albuterol (VENTOLIN HFA) 108 (90 Base) MCG/ACT inhaler Inhale 2 puffs into the lungs every 6 (six) hours as needed for wheezing. 11/11/21  Yes Karie Mainland, Amjad, PA-C  methylPREDNISolone (MEDROL DOSEPAK) 4 MG TBPK tablet Take as instructed on the package 01/06/22  Yes Ernie Avena, MD  naproxen sodium (ALEVE) 220 MG tablet Take 220 mg by mouth daily as needed (for pain).   Yes [provider]  PARoxetine (PAXIL-CR) 12.5 MG 24 hr tablet Take 12.5 mg by mouth every morning. 07/08/21  Yes [provider]  EPINEPHrine 0.3 mg/0.3 mL IJ SOAJ injection Inject into the muscle daily. Patient not taking: Reported on 01/06/2022 07/08/21   [provider]      Allergies    Celery oil, Corn oil, Iron sucrose, Lac bovis, Nutricap [actical], Onion, Other,  Pedi-pre tape spray [wound dressing adhesive], and Vanilla    Review of Systems   Review of Systems  Respiratory:  Positive for cough and wheezing.   Neurological:  Positive for headaches.   Physical Exam Updated Vital Signs BP (!) 125/99    Pulse 94    Temp 100 F (37.8 C) (Oral)    Resp (!) 22    Ht 5' (1.524 m)    Wt 108.9 kg    LMP 01/02/2022 (Approximate)    SpO2 95%    BMI 46.87 kg/m  Physical Exam Vitals and nursing note reviewed.  Constitutional:      General: She is not in acute distress.    Appearance: She is well-developed.  HENT:     Head: Normocephalic and atraumatic.  Eyes:     Conjunctiva/sclera: Conjunctivae normal.     Pupils: Pupils are equal, round, and reactive to light.  Cardiovascular:     Rate and Rhythm: Normal rate and regular rhythm.     Heart sounds: No murmur heard. Pulmonary:     Effort: Pulmonary effort is normal. No respiratory distress.     Breath sounds: Stridor present. Wheezing present.  Abdominal:     General: There is no distension.     Palpations: Abdomen is soft.     Tenderness: There is no abdominal tenderness. There is no guarding.  Musculoskeletal:        General: No swelling, deformity or signs of injury.  Cervical back: Neck supple.  Skin:    General: Skin is warm and dry.     Capillary Refill: Capillary refill takes less than 2 seconds.     Findings: No lesion or rash.  Neurological:     General: No focal deficit present.     Mental Status: She is alert. Mental status is at baseline.  Psychiatric:        Mood and Affect: Mood normal.    ED Results / Procedures / Treatments   Labs (all labs ordered are listed, but only abnormal results are displayed) Labs Reviewed  RESP PANEL BY RT-PCR (FLU A&B, COVID) ARPGX2 - Abnormal; Notable for the following components:      Result Value   SARS Coronavirus 2 by RT PCR POSITIVE (*)    All other components within normal limits  BLOOD GAS, VENOUS  COOXEMETRY PANEL    EKG EKG  Interpretation  Date/Time:  Sunday January 05 2022 22:28:00 EST Ventricular Rate:  112 PR Interval:  129 QRS Duration: 105 QT Interval:  358 QTC Calculation: 489 R Axis:   58 Text Interpretation: Sinus tachycardia Low voltage, precordial leads Borderline repolarization abnormality Borderline prolonged QT interval Confirmed by Ernie AvenaLawsing, Noelle Hoogland ([id number]) on 01/05/2022 11:00:26 PM  Radiology DG Chest Portable 1 View  Result Date: 01/05/2022 CLINICAL DATA:  Cough.  Headache, chills, nausea.  Wheezing. EXAM: PORTABLE CHEST 1 VIEW COMPARISON:  12/04/2021 FINDINGS: Patient's chin obscures the apices.The cardiomediastinal contours are normal. Mild peribronchial thickening. Pulmonary vasculature is normal. No consolidation, pleural effusion, or pneumothorax. No acute osseous abnormalities are seen. IMPRESSION: Mild peribronchial thickening suggesting bronchitis or asthma. Electronically Signed   By: Narda RutherfordMelanie  Sanford M.D.   On: 01/05/2022 22:31    Procedures Procedures    Medications Ordered in ED Medications  ipratropium-albuterol (DUONEB) 0.5-2.5 (3) MG/3ML nebulizer solution 3 mL (3 mLs Nebulization Given 01/05/22 2334)  metoCLOPramide (REGLAN) injection 10 mg (10 mg Intravenous Given 01/05/22 2341)  diphenhydrAMINE (BENADRYL) injection 25 mg (25 mg Intravenous Given 01/05/22 2342)  acetaminophen (TYLENOL) tablet 1,000 mg (1,000 mg Oral Given 01/05/22 2332)  lactated ringers bolus 1,000 mL (0 mLs Intravenous Stopped 01/06/22 0123)  albuterol (VENTOLIN HFA) 108 (90 Base) MCG/ACT inhaler 2 puff (2 puffs Inhalation Given 01/06/22 0036)    ED Course/ Medical Decision Making/ A&P Clinical Course as of 01/06/22 0225  Sun Jan 05, 2022  2259 EMS staff notified us upon patient's arrival that the car monoxide detector in their truck was going off.  There is concern that patient might have been exposed to carbon monoxide. [LJ]  2358 SARS Coronavirus 2 by RT PCR(!): POSITIVE [JL]    Clinical Course User Index [JL]  Ernie AvenaLawsing, Paige Vanderwoude, MD [LJ] Placido SouJoldersma, Logan, PA-C                           Medical Decision Making  31 year old female with a history of asthma who presents to the emergency department with a cough, headache, chills, nausea and wheezing that has been present for 1 day.  The patient states that her significant other was also sick with URI symptoms.  She does not have an inhaler at home currently.  She states that symptoms have been present for the past 1 day.  She denies any chest pain at this time.  She is tolerating oral intake.    On arrival, the patient was afebrile, temperature 100, mildly tachycardic P1 16, not tachypneic, saturating 98% on room air, hemodynamically  stable.  Cardiac telemetry revealed sinus tachycardia with subsequent improvement to normal sinus rhythm after volume resuscitation.  Patient had mild expiratory wheezing on exam. Suspect likely viral upper respiratory infection, influenza or COVID-19 triggering a mild asthma exacerbation.  The patient was treated with a DuoNeb inhaler in the emergency department.  A chest x-ray was performed revealed mild peribronchial thickening suggesting bronchitis or asthma per the radiology read.  I reviewed the chest x-ray and agree.  No focal consolidation to suggest pneumonia.  EMS provide additional history and stated that upon patient's arrival their truck car monoxide detector went off.  There was some concern that the patient had carbon oxide exposure.  Due to this, cooximetry and VBG was performed which ultimately resulted normal.  The patient did undergo PCR testing for COVID-19 which resulted positive which likely explains the patient's symptoms.  She was ambulated in the emergency department with no desaturations on pulse oximetry noted.  Given the patient's wheezing and history of asthma, she was reassessed and found to be with lungs that were clear to auscultation.  She was administered IV fluid bolus and had an improvement in her sinus  tachycardia.  She is tolerating oral intake.  Overall feel that the patient is stable for discharge at this time.  The patient was provided with an inhaler for discharge.  Her headache improved with a migraine cocktail.  Symptoms of her headache were described as tension type in the setting of COVID-19.  She has no neurologic deficits.  I do not think further testing is warranted for further evaluation at this time.  Overall feel the patient is stable for continued outpatient management of her COVID symptoms.  Will prescribe a short course of steroids given her history of asthma and concern for mild asthma exacerbation today.  Advised that the patient isolate at home for the next 5 days.    Final Clinical Impression(s) / ED Diagnoses Final diagnoses:  COVID-19  Mild asthma with exacerbation, unspecified whether persistent    Rx / DC Orders ED Discharge Orders          Ordered    methylPREDNISolone (MEDROL DOSEPAK) 4 MG TBPK tablet        01/06/22 0028              Ernie Avena, MD 01/06/22 [id number]

## 2022-01-05 NOTE — ED Notes (Signed)
Pt ambulatory from triage rm to bathroom w/o assistance and with steady gait

## 2022-01-05 NOTE — ED Triage Notes (Signed)
Pt BIB EMS. Pt reports with headache, chills, nausea, cough, and wheezing since this morning.

## 2022-01-06 LAB — BLOOD GAS, VENOUS
Acid-Base Excess: 2 mmol/L (ref 0.0–2.0)
Bicarbonate: 27.4 mmol/L (ref 20.0–28.0)
O2 Saturation: 62.8 %
Patient temperature: 98.6
pCO2, Ven: 48.1 mmHg (ref 44.0–60.0)
pH, Ven: 7.374 (ref 7.250–7.430)
pO2, Ven: 34.9 mmHg (ref 32.0–45.0)

## 2022-01-06 LAB — COOXEMETRY PANEL
Carboxyhemoglobin: 1.3 % (ref 0.5–1.5)
Methemoglobin: 1.1 % (ref 0.0–1.5)
O2 Saturation: 79.7 %
Total hemoglobin: 14.4 g/dL (ref 12.0–16.0)

## 2022-01-06 MED ORDER — METHYLPREDNISOLONE 4 MG PO TBPK: ORAL_TABLET | ORAL | 0 refills | Status: DC

## 2022-01-06 MED ORDER — ALBUTEROL SULFATE HFA 108 (90 BASE) MCG/ACT IN AERS
2.0000 | INHALATION_SPRAY | Freq: Once | RESPIRATORY_TRACT | Status: AC
Start: 2022-01-06 — End: 2022-01-06
  Administered 2022-01-06: 2 via RESPIRATORY_TRACT
  Filled 2022-01-06: qty 6.7

## 2022-01-06 NOTE — ED Notes (Signed)
Pt ambulated roughly 110ft with pulse ox. Pt maintained O2 saturation of 97%. Pt did complain of SOB but not any worse than when being sedentary.

## 2022-01-06 NOTE — Discharge Instructions (Addendum)
You were evaluated in the Emergency Department and after careful evaluation, we did not find any emergent condition requiring admission or further testing in the hospital.  Your exam/testing today was overall reassuring.  You tested positive which is also likely triggering a mild asthma exacerbation.  Recommend you isolate at home for the next 5 days.  Your boyfriend should also be tested as he likely also has COVID.  Please return to the Emergency Department if you experience any worsening of your condition.  Thank you for allowing [place] to be a part of your care.

## 2022-03-13 ENCOUNTER — Emergency Department (HOSPITAL_COMMUNITY): Payer: Medicaid Other

## 2022-03-13 ENCOUNTER — Other Ambulatory Visit: Payer: Self-pay

## 2022-03-13 ENCOUNTER — Observation Stay (HOSPITAL_COMMUNITY)
Admission: EM | Admit: 2022-03-13 | Discharge: 2022-03-14 | Disposition: A | Discharge status: Home / Self Care | Payer: Medicaid Other | Attending: Student | Admitting: Student

## 2022-03-13 ENCOUNTER — Encounter (HOSPITAL_COMMUNITY): Payer: Self-pay | Admitting: Emergency Medicine

## 2022-03-13 DIAGNOSIS — R0602 Shortness of breath: Secondary | ICD-10-CM | POA: Diagnosis present

## 2022-03-13 DIAGNOSIS — J4551 Severe persistent asthma with (acute) exacerbation: Secondary | ICD-10-CM | POA: Diagnosis present

## 2022-03-13 DIAGNOSIS — J45901 Unspecified asthma with (acute) exacerbation: Secondary | ICD-10-CM | POA: Diagnosis not present

## 2022-03-13 DIAGNOSIS — J4550 Severe persistent asthma, uncomplicated: Secondary | ICD-10-CM

## 2022-03-13 DIAGNOSIS — Z20822 Contact with and (suspected) exposure to covid-19: Secondary | ICD-10-CM | POA: Diagnosis not present

## 2022-03-13 DIAGNOSIS — J4541 Moderate persistent asthma with (acute) exacerbation: Secondary | ICD-10-CM | POA: Diagnosis not present

## 2022-03-13 DIAGNOSIS — F32A Depression, unspecified: Secondary | ICD-10-CM

## 2022-03-13 HISTORY — DX: Severe persistent asthma, uncomplicated: J45.50

## 2022-03-13 LAB — COMPREHENSIVE METABOLIC PANEL
ALT: 18 U/L (ref 0–44)
AST: 15 U/L (ref 15–41)
Albumin: 3.9 g/dL (ref 3.5–5.0)
Alkaline Phosphatase: 50 U/L (ref 38–126)
Anion gap: 10 (ref 5–15)
BUN: 14 mg/dL (ref 6–20)
CO2: 25 mmol/L (ref 22–32)
Calcium: 8.9 mg/dL (ref 8.9–10.3)
Chloride: 102 mmol/L (ref 98–111)
Creatinine, Ser: 0.85 mg/dL (ref 0.44–1.00)
GFR, Estimated: >60 mL/min (ref >60)
Glucose, Bld: 90 mg/dL (ref 70–99)
Potassium: 3.9 mmol/L (ref 3.5–5.1)
Sodium: 137 mmol/L (ref 135–145)
Total Bilirubin: 0.2 mg/dL — ABNORMAL LOW (ref 0.3–1.2)
Total Protein: 7.1 g/dL (ref 6.5–8.1)

## 2022-03-13 LAB — CBC
HCT: 41.2 % (ref 36.0–46.0)
Hemoglobin: 13.4 g/dL (ref 12.0–15.0)
MCH: 27.5 pg (ref 26.0–34.0)
MCHC: 32.5 g/dL (ref 30.0–36.0)
MCV: 84.6 fL (ref 80.0–100.0)
Platelets: 224 10*3/uL (ref 150–400)
RBC: 4.87 MIL/uL (ref 3.87–5.11)
RDW: 13.4 % (ref 11.5–15.5)
WBC: 8.3 10*3/uL (ref 4.0–10.5)
nRBC: 0 % (ref 0.0–0.2)

## 2022-03-13 LAB — BASIC METABOLIC PANEL
Anion gap: 9 (ref 5–15)
BUN: 13 mg/dL (ref 6–20)
CO2: 24 mmol/L (ref 22–32)
Calcium: 8.8 mg/dL — ABNORMAL LOW (ref 8.9–10.3)
Chloride: 104 mmol/L (ref 98–111)
Creatinine, Ser: 0.96 mg/dL (ref 0.44–1.00)
GFR, Estimated: >60 mL/min (ref >60)
Glucose, Bld: 141 mg/dL — ABNORMAL HIGH (ref 70–99)
Potassium: 3.5 mmol/L (ref 3.5–5.1)
Sodium: 137 mmol/L (ref 135–145)

## 2022-03-13 LAB — RESP PANEL BY RT-PCR (FLU A&B, COVID) ARPGX2
Influenza A by PCR: NEGATIVE
Influenza B by PCR: NEGATIVE
SARS Coronavirus 2 by RT PCR: NEGATIVE

## 2022-03-13 LAB — BLOOD GAS, VENOUS
Acid-Base Excess: 4.2 mmol/L — ABNORMAL HIGH (ref 0.0–2.0)
Bicarbonate: 30.3 mmol/L — ABNORMAL HIGH (ref 20.0–28.0)
O2 Saturation: 79.7 %
Patient temperature: 37
pCO2, Ven: 50 mmHg (ref 44–60)
pH, Ven: 7.39 (ref 7.25–7.43)
pO2, Ven: 45 mmHg (ref 32–45)

## 2022-03-13 LAB — CBC WITH DIFFERENTIAL/PLATELET
Abs Immature Granulocytes: 0.03 10*3/uL (ref 0.00–0.07)
Basophils Absolute: 0 10*3/uL (ref 0.0–0.1)
Basophils Relative: 1 %
Eosinophils Absolute: 0.4 10*3/uL (ref 0.0–0.5)
Eosinophils Relative: 5 %
HCT: 39.5 % (ref 36.0–46.0)
Hemoglobin: 12.8 g/dL (ref 12.0–15.0)
Immature Granulocytes: 0 %
Lymphocytes Relative: 27 %
Lymphs Abs: 2.4 10*3/uL (ref 0.7–4.0)
MCH: 27.2 pg (ref 26.0–34.0)
MCHC: 32.4 g/dL (ref 30.0–36.0)
MCV: 84 fL (ref 80.0–100.0)
Monocytes Absolute: 0.7 10*3/uL (ref 0.1–1.0)
Monocytes Relative: 8 %
Neutro Abs: 5.3 10*3/uL (ref 1.7–7.7)
Neutrophils Relative %: 59 %
Platelets: 231 10*3/uL (ref 150–400)
RBC: 4.7 MIL/uL (ref 3.87–5.11)
RDW: 13.4 % (ref 11.5–15.5)
WBC: 8.8 10*3/uL (ref 4.0–10.5)
nRBC: 0 % (ref 0.0–0.2)

## 2022-03-13 LAB — HCG, QUANTITATIVE, PREGNANCY: hCG, Beta Chain, Quant, S: <1 m[IU]/mL (ref <5)

## 2022-03-13 LAB — TROPONIN I (HIGH SENSITIVITY): Troponin I (High Sensitivity): <2 ng/L (ref <18)

## 2022-03-13 MED ORDER — IPRATROPIUM-ALBUTEROL 0.5-2.5 (3) MG/3ML IN SOLN
6.0000 mL | Freq: Once | RESPIRATORY_TRACT | Status: AC
  Administered 2022-03-13: 6 mL via RESPIRATORY_TRACT
  Filled 2022-03-13: qty 3

## 2022-03-13 MED ORDER — IPRATROPIUM BROMIDE 0.02 % IN SOLN
0.5000 mg | RESPIRATORY_TRACT | Status: DC
  Administered 2022-03-13 – 2022-03-14 (×2): 0.5 mg via RESPIRATORY_TRACT
  Filled 2022-03-13 (×2): qty 2.5

## 2022-03-13 MED ORDER — IPRATROPIUM BROMIDE 0.02 % IN SOLN
0.5000 mg | Freq: Once | RESPIRATORY_TRACT | Status: AC
Start: 2022-03-13 — End: 2022-03-13
  Administered 2022-03-13: 0.5 mg via RESPIRATORY_TRACT
  Filled 2022-03-13: qty 2.5

## 2022-03-13 MED ORDER — ACETAMINOPHEN 650 MG RE SUPP
650.0000 mg | Freq: Four times a day (QID) | RECTAL | Status: DC | PRN
Start: 2022-03-13 — End: 2022-03-14

## 2022-03-13 MED ORDER — ALBUTEROL SULFATE (2.5 MG/3ML) 0.083% IN NEBU
2.5000 mg | INHALATION_SOLUTION | RESPIRATORY_TRACT | Status: DC
  Administered 2022-03-13 – 2022-03-14 (×2): 2.5 mg via RESPIRATORY_TRACT
  Filled 2022-03-13 (×2): qty 3

## 2022-03-13 MED ORDER — PAROXETINE HCL ER 12.5 MG PO TB24
12.5000 mg | ORAL_TABLET | Freq: Every morning | ORAL | Status: DC
  Administered 2022-03-14: 12.5 mg via ORAL
  Filled 2022-03-13: qty 1

## 2022-03-13 MED ORDER — ALBUTEROL SULFATE (2.5 MG/3ML) 0.083% IN NEBU
7.5000 mg/h | INHALATION_SOLUTION | Freq: Once | RESPIRATORY_TRACT | Status: AC
  Administered 2022-03-13: 7.5 mg/h via RESPIRATORY_TRACT
  Filled 2022-03-13: qty 3

## 2022-03-13 MED ORDER — ENOXAPARIN SODIUM 60 MG/0.6ML IJ SOSY
55.0000 mg | PREFILLED_SYRINGE | INTRAMUSCULAR | Status: DC
  Administered 2022-03-13: 55 mg via SUBCUTANEOUS
  Filled 2022-03-13: qty 0.6
  Filled 2022-03-13: qty 0.55

## 2022-03-13 MED ORDER — LACTATED RINGERS IV BOLUS
1000.0000 mL | Freq: Once | INTRAVENOUS | Status: AC
  Administered 2022-03-13: 1000 mL via INTRAVENOUS

## 2022-03-13 MED ORDER — METHYLPREDNISOLONE SODIUM SUCC 125 MG IJ SOLR
125.0000 mg | Freq: Once | INTRAMUSCULAR | Status: AC
  Administered 2022-03-13: 125 mg via INTRAVENOUS
  Filled 2022-03-13: qty 2

## 2022-03-13 MED ORDER — BUDESONIDE 0.25 MG/2ML IN SUSP
0.2500 mg | Freq: Two times a day (BID) | RESPIRATORY_TRACT | Status: DC
  Administered 2022-03-14: 0.25 mg via RESPIRATORY_TRACT
  Filled 2022-03-13: qty 2

## 2022-03-13 MED ORDER — METHYLPREDNISOLONE SODIUM SUCC 40 MG IJ SOLR
40.0000 mg | Freq: Two times a day (BID) | INTRAMUSCULAR | Status: DC
  Administered 2022-03-14: 40 mg via INTRAVENOUS
  Filled 2022-03-13: qty 1

## 2022-03-13 MED ORDER — MAGNESIUM SULFATE IN D5W 1-5 GM/100ML-% IV SOLN
1.0000 g | Freq: Once | INTRAVENOUS | Status: AC
  Administered 2022-03-13: 1 g via INTRAVENOUS
  Filled 2022-03-13: qty 100

## 2022-03-13 MED ORDER — ACETAMINOPHEN 325 MG PO TABS: 650.0000 mg | ORAL_TABLET | Freq: Four times a day (QID) | ORAL | Status: DC | PRN

## 2022-03-13 MED ORDER — ALBUTEROL SULFATE (2.5 MG/3ML) 0.083% IN NEBU: 2.5000 mg | INHALATION_SOLUTION | RESPIRATORY_TRACT | Status: DC | PRN

## 2022-03-13 NOTE — ED Notes (Signed)
ED TO INPATIENT HANDOFF REPORT  ED Nurse Name and Phone #: Louie Casa Name/Age/Gender Holly Nielsen 31 y.o. female Room/Bed: WA19/WA19  Code Status   Code Status: Not on file  Home/SNF/Other Home Patient oriented to: self, place, time, and situation Is this baseline? Yes   Triage Complete: Triage complete  Chief Complaint Asthma exacerbation [J45.901]  Triage Note Reports increased SOB since last night, has tried breathing treatments at home w/o relief. Had a fever of 100.4 last night, cough, increased WOB, was unable to lay flat last night. Dyspnea at rest, worse w/ exertion.    Allergies Allergies  Allergen Reactions   Celery Oil Diarrhea   Corn Oil Diarrhea    rash   Iron Sucrose     Other reaction(s): Dizziness, Other (See Comments), Other - See Comments Pt became flushed, hot and diaphoretic after completion of infusion.  Pt became flushed, hot and diaphoretic after completion of infusion.     Lac Bovis Diarrhea   Nutricap [Actical] Diarrhea   Onion Diarrhea   Other Nausea And Vomiting    Green bell peppers   Pedi-Pre Tape Spray [Wound Dressing Adhesive] Hives   Vanilla Diarrhea    Level of Care/Admitting Diagnosis ED Disposition     ED Disposition  Admit   Condition  --   Comment  Hospital Area: [facility] [[id number]]  Level of Care: Telemetry [5]  Admit to tele based on following criteria: Monitor for Ischemic changes  May place patient in observation at [facility] or [facility] if equivalent level of care is available:: No  Covid Evaluation: Confirmed COVID Negative  Diagnosis: Asthma exacerbation [161096]  Admitting Physician: Eduard Clos [id number]  Attending Physician: Eduard Clos [phone]          B Medical/Surgery History Past Medical History:  Diagnosis Date   Asthma    Guillain-Barre (HCC)    History reviewed. No pertinent surgical history.   A IV Location/Drains/Wounds Patient  Lines/Drains/Airways Status     Active Line/Drains/Airways     Name Placement date Placement time Site Days   Peripheral IV 03/13/22 20 G Right Antecubital 03/13/22  1838  Antecubital  less than 1            Intake/Output Last 24 hours No intake or output data in the 24 hours ending 03/13/22 2112  Labs/Imaging Results for orders placed or performed during the hospital encounter of 03/13/22 (from the past 48 hour(s))  Comprehensive metabolic panel     Status: Abnormal   Collection Time: 03/13/22  6:40 PM  Result Value Ref Range   Sodium 137 135 - 145 mmol/L   Potassium 3.9 3.5 - 5.1 mmol/L   Chloride 102 98 - 111 mmol/L   CO2 25 22 - 32 mmol/L   Glucose, Bld 90 70 - 99 mg/dL    Comment: Glucose reference range applies only to samples taken after fasting for at least 8 hours.   BUN 14 6 - 20 mg/dL   Creatinine, Ser 1.91 0.44 - 1.00 mg/dL   Calcium 8.9 8.9 - 47.8 mg/dL   Total Protein 7.1 6.5 - 8.1 g/dL   Albumin 3.9 3.5 - 5.0 g/dL   AST 15 15 - 41 U/L   ALT 18 0 - 44 U/L   Alkaline Phosphatase 50 38 - 126 U/L   Total Bilirubin 0.2 (L) 0.3 - 1.2 mg/dL   GFR, Estimated >29 >56 mL/min    Comment: (NOTE) Calculated using the CKD-EPI Creatinine  Equation (2021)    Anion gap 10 5 - 15    Comment: Performed at [facility], [address] [place], [place] [address]  CBC with Differential     Status: None   Collection Time: 03/13/22  6:40 PM  Result Value Ref Range   WBC 8.8 4.0 - 10.5 K/uL   RBC 4.70 3.87 - 5.11 MIL/uL   Hemoglobin 12.8 12.0 - 15.0 g/dL   HCT 60.4 54.0 - 98.1 %   MCV 84.0 80.0 - 100.0 fL   MCH 27.2 26.0 - 34.0 pg   MCHC 32.4 30.0 - 36.0 g/dL   RDW 19.1 47.8 - 29.5 %   Platelets 231 150 - 400 K/uL   nRBC 0.0 0.0 - 0.2 %   Neutrophils Relative % 59 %   Neutro Abs 5.3 1.7 - 7.7 K/uL   Lymphocytes Relative 27 %   Lymphs Abs 2.4 0.7 - 4.0 K/uL   Monocytes Relative 8 %   Monocytes Absolute 0.7 0.1 - 1.0 K/uL   Eosinophils Relative 5 %    Eosinophils Absolute 0.4 0.0 - 0.5 K/uL   Basophils Relative 1 %   Basophils Absolute 0.0 0.0 - 0.1 K/uL   Immature Granulocytes 0 %   Abs Immature Granulocytes 0.03 0.00 - 0.07 K/uL    Comment: Performed at [facility], [address], [place], [place] [address]  Troponin I (High Sensitivity)     Status: None   Collection Time: 03/13/22  6:40 PM  Result Value Ref Range   Troponin I (High Sensitivity) <2 <18 ng/L    Comment: (NOTE) Elevated high sensitivity troponin I (hsTnI) values and significant  changes across serial measurements may suggest ACS but many other  chronic and acute conditions are known to elevate hsTnI results.  Refer to the "Links" section for chest pain algorithms and additional  guidance. Performed at [facility], [address] [place], [place] [address]   Resp Panel by RT-PCR (Flu A&B, Covid) Nasopharyngeal Swab     Status: None   Collection Time: 03/13/22  6:40 PM   Specimen: Nasopharyngeal Swab; Nasopharyngeal(NP) swabs in vial transport medium  Result Value Ref Range   SARS Coronavirus 2 by RT PCR NEGATIVE NEGATIVE    Comment: (NOTE) SARS-CoV-2 target nucleic acids are NOT DETECTED.  The SARS-CoV-2 RNA is generally detectable in upper respiratory specimens during the acute phase of infection. The lowest concentration of SARS-CoV-2 viral copies this assay can detect is 138 copies/mL. A negative result does not preclude SARS-Cov-2 infection and should not be used as the sole basis for treatment or other patient management decisions. A negative result may occur with  improper specimen collection/handling, submission of specimen other than nasopharyngeal swab, presence of viral mutation(s) within the areas targeted by this assay, and inadequate number of viral copies(<138 copies/mL). A negative result must be combined with clinical observations, patient history, and epidemiological information. The expected result  is Negative.  Fact Sheet for Patients:  BloggerCourse.com  Fact Sheet for Healthcare Providers:  SeriousBroker.it  This test is no t yet approved or cleared by the [place] FDA and  has been authorized for detection and/or diagnosis of SARS-CoV-2 by FDA under an Emergency Use Authorization (EUA). This EUA will remain  in effect (meaning this test can be used) for the duration of the COVID-19 declaration under Section 564(b)(1) of the Act, 21 U.S.C.section 360bbb-3(b)(1), unless the authorization is terminated  or revoked sooner.  Influenza A by PCR NEGATIVE NEGATIVE   Influenza B by PCR NEGATIVE NEGATIVE    Comment: (NOTE) The Xpert Xpress SARS-CoV-2/FLU/RSV plus assay is intended as an aid in the diagnosis of influenza from Nasopharyngeal swab specimens and should not be used as a sole basis for treatment. Nasal washings and aspirates are unacceptable for Xpert Xpress SARS-CoV-2/FLU/RSV testing.  Fact Sheet for Patients: BloggerCourse.com  Fact Sheet for Healthcare Providers: SeriousBroker.it  This test is not yet approved or cleared by the [place] FDA and has been authorized for detection and/or diagnosis of SARS-CoV-2 by FDA under an Emergency Use Authorization (EUA). This EUA will remain in effect (meaning this test can be used) for the duration of the COVID-19 declaration under Section 564(b)(1) of the Act, 21 U.S.C. section 360bbb-3(b)(1), unless the authorization is terminated or revoked.  Performed at [facility], [address], [place], [place] [address]   Blood gas, venous (at [facility] and [facility], not at [facility])     Status: Abnormal   Collection Time: 03/13/22  6:40 PM  Result Value Ref Range   pH, Ven 7.39 7.25 - 7.43   pCO2, Ven 50 44 - 60 mmHg   pO2, Ven 45 32 - 45 mmHg   Bicarbonate 30.3 (H) 20.0 - 28.0 mmol/L   Acid-Base Excess  4.2 (H) 0.0 - 2.0 mmol/L   O2 Saturation 79.7 %   Patient temperature 37.0     Comment: Performed at [facility], [address] [place], [place] [address]   DG Chest 2 View  Result Date: 03/13/2022 CLINICAL DATA:  Shortness of breath. EXAM: CHEST - 2 VIEW COMPARISON:  Chest x-ray 01/05/2022 FINDINGS: The heart size and mediastinal contours are within normal limits. Both lungs are clear. The visualized skeletal structures are unremarkable. IMPRESSION: No active cardiopulmonary disease. Electronically Signed   By: Darliss Cheney M.D.   On: 03/13/2022 18:05    Pending Labs Unresulted Labs (From admission, onward)    None       Vitals/Pain Today's Vitals   03/13/22 1930 03/13/22 2006 03/13/22 2034 03/13/22 2035  BP: (!) 138/91  (!) 149/76   Pulse: 71 85 92   Resp: 19 18 (!) 21   Temp:      TempSrc:      SpO2: 96% 98% 100%   Weight:      Height:      PainSc:    0-No pain    Isolation Precautions No active isolations  Medications Medications  ipratropium-albuterol (DUONEB) 0.5-2.5 (3) MG/3ML nebulizer solution 6 mL (6 mLs Nebulization Given 03/13/22 1839)  methylPREDNISolone sodium succinate (SOLU-MEDROL) 125 mg/2 mL injection 125 mg (125 mg Intravenous Given 03/13/22 1840)  magnesium sulfate IVPB 1 g 100 mL (0 g Intravenous Stopped 03/13/22 1945)  lactated ringers bolus 1,000 mL (1,000 mLs Intravenous New Bag/Given 03/13/22 1945)  albuterol (PROVENTIL) (2.5 MG/3ML) 0.083% nebulizer solution (7.5 mg/hr Nebulization Given 03/13/22 2004)  ipratropium (ATROVENT) nebulizer solution 0.5 mg (0.5 mg Nebulization Given 03/13/22 2004)    Mobility walks Low fall risk   Focused Assessments Pulmonary Assessment Handoff:  Lung sounds: Bilateral Breath Sounds: Inspiratory wheezes, Expiratory wheezes L Breath Sounds: Inspiratory wheezes, Expiratory wheezes R Breath Sounds: Inspiratory wheezes, Expiratory wheezes O2 Device: Room Air      R Recommendations: See  Admitting Provider Note  Report given to:   Additional Notes: -

## 2022-03-13 NOTE — ED Provider Notes (Signed)
[facility]-EMERGENCY DEPT Provider Note  CSN: [id number] Arrival date & time: 03/13/22 1658  Chief Complaint(s) Shortness of Breath  HPI Holly Nielsen is a 31 y.o. female with PMH asthma, GBS who presents the emergency department for evaluation of shortness of breath.  Patient states that she has had some mild worsening shortness of breath over the last 72 hours but over the last 24 hours it is acutely worsened.  She states that she used her nebulizer treatments at home but is having persistent shortness of breath bringing her to the emergency department.  She also endorses associated chest pain primarily with coughing but denies diaphoresis, nausea, vomiting, headache, fever or other systemic symptoms.   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 Authorizing Provider  albuterol (PROVENTIL) (2.5 MG/3ML) 0.083% nebulizer solution Take 3 mLs by nebulization every 6 (six) hours as needed for shortness of breath. 07/08/21   [provider]  albuterol (VENTOLIN HFA) 108 (90 Base) MCG/ACT inhaler Inhale 2 puffs into the lungs every 6 (six) hours as needed for wheezing. 11/11/21   Marita Kansas, PA-C  EPINEPHrine 0.3 mg/0.3 mL IJ SOAJ injection Inject into the muscle  daily. Patient not taking: Reported on 01/06/2022 07/08/21   [provider]  methylPREDNISolone (MEDROL DOSEPAK) 4 MG TBPK tablet Take as instructed on the package 01/06/22   Ernie Avena, MD  naproxen sodium (ALEVE) 220 MG tablet Take 220 mg by mouth daily as needed (for pain).    [provider]  PARoxetine (PAXIL-CR) 12.5 MG 24 hr tablet Take 12.5 mg by mouth every morning. 07/08/21   [provider]                                                                                                                                    Past Surgical History History reviewed. No pertinent surgical history. Family History No family history on file.  Social History Social History   Tobacco Use   Smoking status: Never   Smokeless tobacco: Never  Vaping Use   Vaping Use: Never used  Substance Use Topics   Alcohol use: Not Currently   Drug use: Not Currently   Allergies Celery oil, Corn oil, Iron sucrose,  Lac bovis, Nutricap [actical], Onion, Other, Pedi-pre tape spray [wound dressing adhesive], and Vanilla  Review of Systems Review of Systems  Respiratory:  Positive for cough, chest tightness, shortness of breath and wheezing.   Cardiovascular:  Positive for chest pain.   Physical Exam Vital Signs  I have reviewed the triage vital signs BP (!) 134/104 (BP Location: Left Arm)   Pulse 83   Temp 98 F (36.7 C) (Oral)   Resp 20   Ht 5' (1.524 m)   Wt 109 kg   SpO2 100%   BMI 46.93 kg/m   Physical Exam Vitals and nursing note reviewed.  Constitutional:      General: She is not in acute distress.    Appearance: She is well-developed.  HENT:     Head: Normocephalic and atraumatic.  Eyes:     Conjunctiva/sclera: Conjunctivae normal.  Cardiovascular:     Rate and Rhythm: Normal rate and regular rhythm.     Heart sounds: No murmur heard. Pulmonary:     Effort: Tachypnea, accessory muscle usage and respiratory distress present.     Breath sounds:  Wheezing present.  Abdominal:     Palpations: Abdomen is soft.     Tenderness: There is no abdominal tenderness.  Musculoskeletal:        General: No swelling.     Cervical back: Neck supple.  Skin:    General: Skin is warm and dry.     Capillary Refill: Capillary refill takes less than 2 seconds.  Neurological:     Mental Status: She is alert.  Psychiatric:        Mood and Affect: Mood normal.    ED Results and Treatments Labs (all labs ordered are listed, but only abnormal results are displayed) Labs Reviewed  COMPREHENSIVE METABOLIC PANEL - Abnormal; Notable for the following components:      Result Value   Total Bilirubin 0.2 (*)    All other components within normal limits  BLOOD GAS, VENOUS - Abnormal; Notable for the following components:   Bicarbonate 30.3 (*)    Acid-Base Excess 4.2 (*)    All other components within normal limits  RESP PANEL BY RT-PCR (FLU A&B, COVID) ARPGX2  CBC WITH DIFFERENTIAL/PLATELET  TROPONIN I (HIGH SENSITIVITY)                                                                                                                          Radiology DG Chest 2 View  Result Date: 03/13/2022 CLINICAL DATA:  Shortness of breath. EXAM: CHEST - 2 VIEW COMPARISON:  Chest x-ray 01/05/2022 FINDINGS: The heart size and mediastinal contours are within normal limits. Both lungs are clear. The visualized skeletal structures are unremarkable. IMPRESSION: No active cardiopulmonary disease. Electronically Signed   By: Darliss Cheney M.D.   On: 03/13/2022 18:05    Pertinent labs & imaging results that were available during my care of the patient were reviewed by me and considered in my medical decision  making (see MDM for details).  Medications Ordered in ED Medications  magnesium sulfate IVPB 1 g 100 mL (1 g Intravenous New Bag/Given 03/13/22 1840)  lactated ringers bolus 1,000 mL (has no administration in time range)  albuterol (PROVENTIL,VENTOLIN) solution  continuous neb (has no administration in time range)  ipratropium (ATROVENT) nebulizer solution 0.5 mg (has no administration in time range)  ipratropium-albuterol (DUONEB) 0.5-2.5 (3) MG/3ML nebulizer solution 6 mL (6 mLs Nebulization Given 03/13/22 1839)  methylPREDNISolone sodium succinate (SOLU-MEDROL) 125 mg/2 mL injection 125 mg (125 mg Intravenous Given 03/13/22 1840)                                                                                                                                     Procedures .Critical Care Performed by: Glendora Score, MD Authorized by: Glendora Score, MD   Critical care provider statement:    Critical care time (minutes):  30   Critical care was necessary to treat or prevent imminent or life-threatening deterioration of the following conditions:  Respiratory failure   Critical care was time spent personally by me on the following activities:  Development of treatment plan with patient or surrogate, discussions with consultants, evaluation of patient's response to treatment, examination of patient, ordering and review of laboratory studies, ordering and review of radiographic studies, ordering and performing treatments and interventions, pulse oximetry, re-evaluation of patient's condition and review of old charts  (including critical care time)  Medical Decision Making / ED Course   This patient presents to the ED for concern of dyspnea, chest pain, this involves an extensive number of treatment options, and is a complaint that carries with it a high risk of complications and morbidity.  The differential diagnosis includes asthma exacerbation, pneumonia, COVID-19, influenza, PE, ACS  MDM: Patient seen emergency department for evaluation of shortness of breath and chest pain.  Physical exam reveals tachypnea, accessory muscle use, significant end expiratory wheezing.  Chest x-ray unremarkable.  Laboratory evaluation unremarkable including negative  high-sensitivity troponin.  COVID and flu negative.  Patient satting 98% on room air on arrival but there is certainly concern for asthma exacerbation here.  Patient given 2 DuoNebs back-to-back and had mild improvement of her wheezing but she did have persistent wheezing on reevaluation and that she was given continuous hour-long albuterol treatment and 125 methylprednisolone as well as 1 mg of magnesium and on second reevaluation her wheezing had improved, but on walk of life she still had persistent tachypnea and increased work of breathing.  We have shared decision making and due to patient's persistent work of breathing on exertion, she will be admitted for observation and q 4 albuterol treatments.  Patient then admitted.   Additional history obtained:  -External records from outside source obtained and reviewed including: Chart review including previous notes, labs, imaging, consultation notes   Lab Tests: -I ordered, reviewed, and interpreted labs.   The pertinent results include:  Labs Reviewed  COMPREHENSIVE METABOLIC PANEL - Abnormal; Notable for the following components:      Result Value   Total Bilirubin 0.2 (*)    All other components within normal limits  BLOOD GAS, VENOUS - Abnormal; Notable for the following components:   Bicarbonate 30.3 (*)    Acid-Base Excess 4.2 (*)    All other components within normal limits  RESP PANEL BY RT-PCR (FLU A&B, COVID) ARPGX2  CBC WITH DIFFERENTIAL/PLATELET  TROPONIN I (HIGH SENSITIVITY)       Imaging Studies ordered: I ordered imaging studies including CXR I independently visualized and interpreted imaging. I agree with the radiologist interpretation   Medicines ordered and prescription drug management: Meds ordered this encounter  Medications   ipratropium-albuterol (DUONEB) 0.5-2.5 (3) MG/3ML nebulizer solution 6 mL   methylPREDNISolone sodium succinate (SOLU-MEDROL) 125 mg/2 mL injection 125 mg    IV methylprednisolone will  be converted to either a q12h or q24h frequency with the same total daily dose (TDD).  Ordered Dose: 1 to 125 mg TDD; convert to: TDD q24h.  Ordered Dose: 126 to 250 mg TDD; convert to: TDD div q12h.  Ordered Dose: >250 mg TDD; DAW.   magnesium sulfate IVPB 1 g 100 mL   lactated ringers bolus 1,000 mL   albuterol (PROVENTIL,VENTOLIN) solution continuous neb   ipratropium (ATROVENT) nebulizer solution 0.5 mg    -I have reviewed the patients home medicines and have made adjustments as needed  Critical interventions Multiple DuoNebs, steroids  Cardiac Monitoring: The patient was maintained on a cardiac monitor.  I personally viewed and interpreted the cardiac monitored which showed an underlying rhythm of: NSR  Social Determinants of Health:  Factors impacting patients care include: none   Reevaluation: After the interventions noted above, I reevaluated the patient and found that they have :improved  Co morbidities that complicate the patient evaluation  Past Medical History:  Diagnosis Date   Asthma    Guillain-Barre (HCC)       Dispostion: I considered admission for this patient, due to persistent work of breathing she will require admission     Final Clinical Impression(s) / ED Diagnoses Final diagnoses:  None     @PCDICTATION @    Rashell Shambaugh, Wyn Forster, MD 03/13/22 2249

## 2022-03-13 NOTE — H&P (Signed)
?History and Physical  ? ? ?Holly Nielsen [id number] DOB: Jul 17, 1991 DOA: 03/13/2022 ? ?PCP: Pcp, No  ?Patient coming from: Home. ? ?Chief Complaint: Shortness of breath. ? ?HPI: Holly Nielsen is a 31 y.o. 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  Patient states she usually uses Advair Diskus and DuoNebs.  She did run out of her DuoNebs after her last treatment prior to coming to the ER.  Also has been in some left anterior chest wall pain nonradiating.  Chest wall pain which patient states is typical of asthma exacerbation. ? ?Patient just recently moved from [place] to [place]. ? ?ED Course: In the ER patient was diffusely wheezing chest x-ray does not show anything acute.  Troponins were negative.  COVID test negative.  Since patient is still wheezing despite steroids and nebulizer admitted for further observation. ? ?Review of Systems: As per HPI, rest all negative. ? ? ?Past Medical History:  ?Diagnosis Date  ? Asthma   ? Guillain-Barre (HCC)   ? ? ?Past Surgical History:  ?Procedure Laterality Date  ? APPENDECTOMY    ? CHOLECYSTECTOMY    ? ? ? reports that she has never smoked. She has never used smokeless tobacco. She reports that she does not drink alcohol and does not use drugs. ? ?Allergies  ?Allergen Reactions  ? Celery Oil Diarrhea  ? Corn Oil Diarrhea  ?  rash  ? Iron Sucrose   ?  Other reaction(s): Dizziness, Other (See Comments), Other - See Comments ?Pt became flushed, hot and diaphoretic after completion of infusion.  ?Pt became flushed, hot and diaphoretic after completion of infusion.  ?  ? Lac Bovis Diarrhea  ? Nutricap [Actical] Diarrhea  ? Onion Diarrhea  ? Other Nausea And Vomiting  ?  Green bell peppers  ? Pedi-Pre Tape Spray [Wound Dressing Adhesive] Hives  ? Vanilla Diarrhea  ? ? ?Family History  ?Problem Relation Age of Onset  ? Asthma Father   ? Stroke Paternal  Grandfather   ? Diabetes Mellitus I Paternal Grandmother   ? ? ?Prior to Admission medications   ?Medication Sig Start Date End Date Taking? Authorizing Provider  ?albuterol (PROVENTIL) (2.5 MG/3ML) 0.083% nebulizer solution Take 3 mLs by nebulization every 6 (six) hours as needed for shortness of breath. 07/08/21   [provider]  ?albuterol (VENTOLIN HFA) 108 (90 Base) MCG/ACT inhaler Inhale 2 puffs into the lungs every 6 (six) hours as needed for wheezing. 11/11/21   Marita Kansas, PA-C  ?EPINEPHrine 0.3 mg/0.3 mL IJ SOAJ injection Inject into the muscle daily. ?Patient not taking: Reported on 01/06/2022 07/08/21   [provider]  ?methylPREDNISolone (MEDROL DOSEPAK) 4 MG TBPK tablet Take as instructed on the package 01/06/22   Ernie Avena, MD  ?naproxen sodium (ALEVE) 220 MG tablet Take 220 mg by mouth daily as needed (for pain).    [provider]  ?PARoxetine (PAXIL-CR) 12.5 MG 24 hr tablet Take 12.5 mg by mouth every morning. 07/08/21   [provider]  ? ? ?Physical Exam: ?Constitutional: Moderately built and nourished. ?Vitals:  ? 03/13/22 1858 03/13/22 1930 03/13/22 2006 03/13/22 2034  ?BP: (!) 134/104 (!) 138/91  (!) 149/76  ?Pulse: 83 71 85 92  ?Resp: 20 19 18  (!) 21  ?Temp:      ?TempSrc:      ?SpO2: 100% 96% 98% 100%  ?Weight:      ?  Height:      ? ?Eyes: Anicteric no pallor. ?ENMT: No discharge from the ears eyes nose and mouth. ?Neck: No mass felt.  No neck rigidity. ?Respiratory: Bilateral expiratory wheeze and no crepitations. ?Cardiovascular: S1-S2 heard. ?Abdomen: Soft nontender bowel sound present. ?Musculoskeletal: No edema. ?Skin: No rash. ?Neurologic: Alert awake oriented time place and person.  Moves all extremities. ?Psychiatric: Appears normal.  Normal affect. ? ? ?Labs on Admission: I have personally reviewed following labs and imaging studies ? ?CBC: ?Recent Labs  ?Lab 03/13/22 ?1840  ?WBC 8.8  ?NEUTROABS 5.3  ?HGB 12.8  ?HCT 39.5  ?MCV 84.0  ?PLT 231   ? ?Basic Metabolic Panel: ?Recent Labs  ?Lab 03/13/22 ?1840  ?NA 137  ?K 3.9  ?CL 102  ?CO2 25  ?GLUCOSE 90  ?BUN 14  ?CREATININE 0.85  ?CALCIUM 8.9  ? ?GFR: ?Estimated Creatinine Clearance: 109.3 mL/min (by C-G formula based on SCr of 0.85 mg/dL). ?Liver Function Tests: ?Recent Labs  ?Lab 03/13/22 ?1840  ?AST 15  ?ALT 18  ?ALKPHOS 50  ?BILITOT 0.2*  ?PROT 7.1  ?ALBUMIN 3.9  ? ?No results for input(s): LIPASE, AMYLASE in the last 168 hours. ?No results for input(s): AMMONIA in the last 168 hours. ?Coagulation Profile: ?No results for input(s): INR, PROTIME in the last 168 hours. ?Cardiac Enzymes: ?No results for input(s): CKTOTAL, CKMB, CKMBINDEX, TROPONINI in the last 168 hours. ?BNP (last 3 results) ?No results for input(s): PROBNP in the last 8760 hours. ?HbA1C: ?No results for input(s): HGBA1C in the last 72 hours. ?CBG: ?No results for input(s): GLUCAP in the last 168 hours. ?Lipid Profile: ?No results for input(s): CHOL, HDL, LDLCALC, TRIG, CHOLHDL, LDLDIRECT in the last 72 hours. ?Thyroid Function Tests: ?No results for input(s): TSH, T4TOTAL, FREET4, T3FREE, THYROIDAB in the last 72 hours. ?Anemia Panel: ?No results for input(s): VITAMINB12, FOLATE, FERRITIN, TIBC, IRON, RETICCTPCT in the last 72 hours. ?Urine analysis: ?No results found for: COLORURINE, APPEARANCEUR, LABSPEC, PHURINE, GLUCOSEU, HGBUR, BILIRUBINUR, KETONESUR, PROTEINUR, UROBILINOGEN, NITRITE, LEUKOCYTESUR ?Sepsis Labs: ?@[email or web address] (procalcitonin:4,lacticidven:4) ?) ?Recent Results (from the past 240 hour(s))  ?Resp Panel by RT-PCR (Flu A&B, Covid) Nasopharyngeal Swab     Status: None  ? Collection Time: 03/13/22  6:40 PM  ? Specimen: Nasopharyngeal Swab; Nasopharyngeal(NP) swabs in vial transport medium  ?Result Value Ref Range Status  ? SARS Coronavirus 2 by RT PCR NEGATIVE NEGATIVE Final  ?  Comment: (NOTE) ?SARS-CoV-2 target nucleic acids are NOT DETECTED. ? ?The SARS-CoV-2 RNA is generally detectable in upper respiratory ?specimens  during the acute phase of infection. The lowest ?concentration of SARS-CoV-2 viral copies this assay can detect is ?138 copies/mL. A negative result does not preclude SARS-Cov-2 ?infection and should not be used as the sole basis for treatment or ?other patient management decisions. A negative result may occur with  ?improper specimen collection/handling, submission of specimen other ?than nasopharyngeal swab, presence of viral mutation(s) within the ?areas targeted by this assay, and inadequate number of viral ?copies(<138 copies/mL). A negative result must be combined with ?clinical observations, patient history, and epidemiological ?information. The expected result is Negative. ? ?Fact Sheet for Patients:  ?03/15/22 ? ?Fact Sheet for Healthcare Providers:  ?BloggerCourse.com ? ?This test is no t yet approved or cleared by the [place] FDA and  ?has been authorized for detection and/or diagnosis of SARS-CoV-2 by ?FDA under an Emergency Use Authorization (EUA). This EUA will remain  ?in effect (meaning this test can be used) for the duration of the ?COVID-19 declaration under  Section 564(b)(1) of the Act, 21 ?U.S.C.section 360bbb-3(b)(1), unless the authorization is terminated  ?or revoked sooner.  ? ? ?  ? Influenza A by PCR NEGATIVE NEGATIVE Final  ? Influenza B by PCR NEGATIVE NEGATIVE Final  ?  Comment: (NOTE) ?The Xpert Xpress SARS-CoV-2/FLU/RSV plus assay is intended as an aid ?in the diagnosis of influenza from Nasopharyngeal swab specimens and ?should not be used as a sole basis for treatment. Nasal washings and ?aspirates are unacceptable for Xpert Xpress SARS-CoV-2/FLU/RSV ?testing. ? ?Fact Sheet for Patients: ?BloggerCourse.comhttps://www.fda.gov/media/152166/download ? ?Fact Sheet for Healthcare Providers: ?SeriousBroker.ithttps://www.fda.gov/media/152162/download ? ?This test is not yet approved or cleared by the [place] FDA and ?has been authorized for detection  and/or diagnosis of SARS-CoV-2 by ?FDA under an Emergency Use Authorization (EUA). This EUA will remain ?in effect (meaning this test can be used) for the duration of the ?COVID-19 declaration under Section 564(b)(1)

## 2022-03-13 NOTE — ED Notes (Signed)
Pt ambulatory to restroom w/out assistance.  

## 2022-03-13 NOTE — ED Notes (Signed)
RT at bedside to given neb txs ?

## 2022-03-13 NOTE — ED Triage Notes (Signed)
Reports increased SOB since last night, has tried breathing treatments at home w/o relief. Had a fever of 100.4 last night, cough, increased WOB, was unable to lay flat last night. Dyspnea at rest, worse w/ exertion.  ?

## 2022-03-14 ENCOUNTER — Other Ambulatory Visit (HOSPITAL_COMMUNITY): Payer: Self-pay

## 2022-03-14 ENCOUNTER — Other Ambulatory Visit: Payer: Self-pay

## 2022-03-14 DIAGNOSIS — Z20822 Contact with and (suspected) exposure to covid-19: Secondary | ICD-10-CM | POA: Diagnosis not present

## 2022-03-14 DIAGNOSIS — F419 Anxiety disorder, unspecified: Secondary | ICD-10-CM

## 2022-03-14 DIAGNOSIS — J45901 Unspecified asthma with (acute) exacerbation: Secondary | ICD-10-CM | POA: Diagnosis not present

## 2022-03-14 DIAGNOSIS — F32A Depression, unspecified: Secondary | ICD-10-CM

## 2022-03-14 DIAGNOSIS — J4541 Moderate persistent asthma with (acute) exacerbation: Secondary | ICD-10-CM | POA: Diagnosis not present

## 2022-03-14 LAB — HIV ANTIBODY (ROUTINE TESTING W REFLEX): HIV Screen 4th Generation wRfx: NONREACTIVE

## 2022-03-14 LAB — D-DIMER, QUANTITATIVE: D-Dimer, Quant: 0.4 ug/mL-FEU (ref 0.00–0.50)

## 2022-03-14 MED ORDER — IPRATROPIUM-ALBUTEROL 0.5-2.5 (3) MG/3ML IN SOLN
3.0000 mL | RESPIRATORY_TRACT | Status: DC
  Administered 2022-03-14 (×2): 3 mL via RESPIRATORY_TRACT
  Filled 2022-03-14: qty 3

## 2022-03-14 MED ORDER — PREDNISONE 50 MG PO TABS
50.0000 mg | ORAL_TABLET | Freq: Every day | ORAL | 0 refills | Status: DC
  Filled 2022-03-14 (×2): qty 4, 4d supply, fill #0

## 2022-03-14 MED ORDER — ALBUTEROL SULFATE (2.5 MG/3ML) 0.083% IN NEBU
3.0000 mL | INHALATION_SOLUTION | Freq: Four times a day (QID) | RESPIRATORY_TRACT | 1 refills | Status: DC | PRN
  Filled 2022-03-14 (×2): qty 90, 8d supply, fill #0
  Filled 2022-03-24: qty 90, 8d supply, fill #1

## 2022-03-14 NOTE — Discharge Summary (Signed)
? ?Physician Discharge Summary  ?Holly Nielsen [id number] DOB: 1991-05-15 DOA: 03/13/2022 ? ?PCP: Pcp, No ? ?Admit date: 03/13/2022 ?Discharge date: 03/14/2022 ?Admitted From: Home ?Disposition: Home ?Recommendations for Outpatient Follow-up:  ?Follow ups as below. ?Please obtain CBC/BMP/Mag at follow up ?Please follow up on the following pending results: None ? ?Home Health: Not indicated ?Equipment/Devices: Not indicated ? ?Discharge Condition: Stable ?CODE STATUS: Full code ? Follow-up Information   ? ? primary care physician. Schedule an appointment as soon as possible for a visit.   ? ?  ?  ? ?  ?  ? ?  ? ? 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  She reportedly ran out of her albuterol nebulizer.  She also reports mold exposure at work.  She was not hypoxic.  CXR, CMP, CBC with differential, troponin, EKG, VBG and D-dimer without significant finding.  She was started on IV Solu-Medrol and nebulizers with improvement in her symptoms. ? ?On the day of discharge, symptoms improved.  Maintained saturation greater than 94% with ambulation on room air.  Discharged on p.o. prednisone and home Advair.  Renewed her prescription for albuterol nebulizer.  Ambulatory referral to pulmonology ordered.  TOC assisted with PCP.   ? ?See individual problem list below for more on hospital course. ? ?Problems addressed during this hospitalization ?Problem  ?Asthma Exacerbation  ?Morbid Obesity With Body Mass Index of 40.0-49.9 (Hcc)  ?Anxiety and Depression  ?  ?Assessment and Plan: ?* Asthma exacerbation ?Presented with SOB, wheeze and chest tightness.  Improved. ?-Discharged on p.o. prednisone, home Advair and as needed albuterol ?-Ambulatory referral to pulmonology ?-TOC assisted with PCP. ? ?Anxiety and depression ?Stable. ?-Continue home Paxil ? ?Morbid  obesity with body mass index of 40.0-49.9 (HCC) ?Body mass index is 49.51 kg/m?. ?-Encourage lifestyle change to lose weight. ? ? ? ?Vital signs ?Vitals:  ? 03/14/22 0320 03/14/22 0401 03/14/22 0750 03/14/22 0802  ?BP:  128/63 118/65   ?Pulse:  (!) 105 96   ?Temp:  97.6 ?F (36.4 ?C) 98.1 ?F (36.7 ?C)   ?Resp:   19   ?Height:      ?Weight:      ?SpO2: 97% 95% 94% 94%  ?TempSrc:  Oral Oral   ?BMI (Calculated):      ?  ? ?Discharge exam ? ?GENERAL: No apparent distress.  Nontoxic. ?HEENT: MMM.  Vision and hearing grossly intact.  ?NECK: Supple.  No apparent JVD.  ?RESP:  No IWOB.  Fair aeration bilaterally. ?CVS:  RRR. Heart sounds normal.  ?ABD/GI/GU: BS+. Abd soft, NTND.  ?MSK/EXT:  Moves extremities. No apparent deformity. No edema.  ?SKIN: no apparent skin lesion or wound ?NEURO: Awake and alert. Oriented appropriately.  No apparent focal neuro deficit. ?PSYCH: Calm. Normal affect.  ? ?Discharge Instructions ?Discharge Instructions   ? ? Ambulatory referral to Pulmonology   Complete by: As directed ?  ? Reason for referral: Asthma/COPD  ? Call MD for:  difficulty breathing, headache or visual disturbances   Complete by: As directed ?  ? Diet general   Complete by: As directed ?  ? Discharge instructions   Complete by: As directed ?  ? It has been a pleasure taking care of you! ? ?You were hospitalized due to asthma exacerbation.  Your symptoms improved with treatment.  We are discharging you on more prednisone and breathing treatments.  We  sent referral to pulmonology.  Someone will get in touch with you over the next 1 to 2 weeks to set up this follow-up.  We also recommend you establish care with primary care doctor.  Please review your new medication list and the directions on your medications before you take them. ? ? ?Take care,  ? Increase activity slowly   Complete by: As directed ?  ? ?  ? ?Allergies as of 03/14/2022   ? ?   Reactions  ? Celery Oil Diarrhea  ? Corn Oil Diarrhea  ? rash  ? Iron Sucrose   ?  Other reaction(s): Dizziness, Other (See Comments), Other - See Comments ?Pt became flushed, hot and diaphoretic after completion of infusion.  ?Pt became flushed, hot and diaphoretic after completion of infusion.   ? Lac Bovis Diarrhea  ? Nutricap [actical] Diarrhea  ? Onion Diarrhea  ? Other Nausea And Vomiting  ? Green bell peppers  ? Pedi-pre Tape Spray [wound Dressing Adhesive] Hives  ? Vanilla Diarrhea  ? ?  ? ?  ?Medication List  ?  ? ?STOP taking these medications   ? ?methylPREDNISolone 4 MG Tbpk tablet ?Commonly known as: MEDROL DOSEPAK ?  ?naproxen sodium 220 MG tablet ?Commonly known as: ALEVE ?  ? ?  ? ?TAKE these medications   ? ?acetaminophen 500 MG tablet ?Commonly known as: TYLENOL ?Take 1,000 mg by mouth every 6 (six) hours as needed for moderate pain. ?  ?albuterol 108 (90 Base) MCG/ACT inhaler ?Commonly known as: VENTOLIN HFA ?Inhale 2 puffs into the lungs every 6 (six) hours as needed for wheezing. ?What changed: Another medication with the same name was changed. Make sure you understand how and when to take each. ?  ?albuterol (2.5 MG/3ML) 0.083% nebulizer solution ?Commonly known as: PROVENTIL ?Inhale 3 mLs by nebulization every 6 (six) hours as needed for shortness of breath or wheezing. ?What changed: reasons to take this ?  ?EPINEPHrine 0.3 mg/0.3 mL Soaj injection ?Commonly known as: EPI-PEN ?Inject into the muscle daily. ?  ?fluticasone-salmeterol 250-50 MCG/ACT Aepb ?Commonly known as: ADVAIR ?Inhale 1 puff into the lungs in the morning and at bedtime. ?  ?PARoxetine 12.5 MG 24 hr tablet ?Commonly known as: PAXIL-CR ?Take 12.5 mg by mouth every morning. ?  ?predniSONE 50 MG tablet ?Commonly known as: DELTASONE ?Take 1 tablet (50 mg total) by mouth daily with breakfast. ?Start taking on: March 15, 2022 ?  ? ?  ? ? ?Consultations: ?None ? ?Procedures/Studies: ?None ? ? ?DG Chest 2 View ? ?Result Date: 03/13/2022 ?CLINICAL DATA:  Shortness of breath. EXAM: CHEST - 2 VIEW COMPARISON:  Chest  x-ray 01/05/2022 FINDINGS: The heart size and mediastinal contours are within normal limits. Both lungs are clear. The visualized skeletal structures are unremarkable. IMPRESSION: No active cardiopulmonary disease. Electronically Signed   By: Ronney Asters M.D.   On: 03/13/2022 18:05   ? ? ? ? ?The results of significant diagnostics from this hospitalization (including imaging, microbiology, ancillary and laboratory) are listed below for reference.   ? ? ?Microbiology: ?Recent Results (from the past 240 hour(s))  ?Resp Panel by RT-PCR (Flu A&B, Covid) Nasopharyngeal Swab     Status: None  ? Collection Time: 03/13/22  6:40 PM  ? Specimen: Nasopharyngeal Swab; Nasopharyngeal(NP) swabs in vial transport medium  ?Result Value Ref Range Status  ? SARS Coronavirus 2 by RT PCR NEGATIVE NEGATIVE Final  ?  Comment: (NOTE) ?SARS-CoV-2 target nucleic acids are NOT DETECTED. ? ?The SARS-CoV-2  RNA is generally detectable in upper respiratory ?specimens during the acute phase of infection. The lowest ?concentration of SARS-CoV-2 viral copies this assay can detect is ?138 copies/mL. A negative result does not preclude SARS-Cov-2 ?infection and should not be used as the sole basis for treatment or ?other patient management decisions. A negative result may occur with  ?improper specimen collection/handling, submission of specimen other ?than nasopharyngeal swab, presence of viral mutation(s) within the ?areas targeted by this assay, and inadequate number of viral ?copies(<138 copies/mL). A negative result must be combined with ?clinical observations, patient history, and epidemiological ?information. The expected result is Negative. ? ?Fact Sheet for Patients:  ?EntrepreneurPulse.com.au ? ?Fact Sheet for Healthcare Providers:  ?IncredibleEmployment.be ? ?This test is no t yet approved or cleared by the [place] FDA and  ?has been authorized for detection and/or diagnosis of SARS-CoV-2 by ?FDA  under an Emergency Use Authorization (EUA). This EUA will remain  ?in effect (meaning this test can be used) for the duration of the ?COVID-19 declaration under Section 564(b)(1) of the Act, 21 ?U.S.C.section

## 2022-03-14 NOTE — Assessment & Plan Note (Signed)
Body mass index is 49.51 kg/m?. ?-Encourage lifestyle change to lose weight. ?

## 2022-03-14 NOTE — TOC Transition Note (Signed)
Transition of Care (TOC) - CM/SW Discharge Note ? ? ?Patient Details  ?Name: Holly Nielsen ?MRN: [id number] ?Date of Birth: Dec 15, 1991 ? ?Transition of Care [facility]) CM/SW Contact:  ?Dessa Phi, RN ?Phone Number: ?03/14/2022, 9:52 AM ? ? ?Clinical Narrative: Provided w/pcp list d/c home. No further needs.   ? ? ? ?Final next level of care: Home/Self Care ?Barriers to Discharge: No Barriers Identified ? ? ?Patient Goals and CMS Choice ?  ?  ?  ? ?Discharge Placement ?  ?           ?  ?  ?  ?  ? ?Discharge Plan and Services ?  ?  ?           ?  ?  ?  ?  ?  ?  ?  ?  ?  ?  ? ?Social Determinants of Health (SDOH) Interventions ?  ? ? ?Readmission Risk Interventions ?No flowsheet data found. ? ? ? ? ?

## 2022-03-14 NOTE — Plan of Care (Signed)
  Problem: Education: Goal: Knowledge of General Education information will improve Description: Including pain rating scale, medication(s)/side effects and non-pharmacologic comfort measures Outcome: Adequate for Discharge   Problem: Clinical Measurements: Goal: Respiratory complications will improve Outcome: Adequate for Discharge   Problem: Activity: Goal: Risk for activity intolerance will decrease Outcome: Adequate for Discharge   

## 2022-03-14 NOTE — Progress Notes (Signed)
SATURATION QUALIFICATIONS: (This note is used to comply with regulatory documentation for home oxygen) ? ?Patient Saturations on Room Air at Rest = 99% ? ?Patient Saturations on Room Air while Ambulating = 94% ? ?Patient Saturations on 0 Liters of oxygen while Ambulating = 94% ? ?Please briefly explain why patient needs home oxygen: Pt does not meet the requirement for home O2. Saturation maintained > 94% on room air.  ?

## 2022-03-14 NOTE — Assessment & Plan Note (Signed)
Presented with SOB, wheeze and chest tightness.  Improved. ?-Discharged on p.o. prednisone, home Advair and as needed albuterol ?-Ambulatory referral to pulmonology ?-TOC assisted with PCP. ?

## 2022-03-14 NOTE — Hospital Course (Signed)
31 year old F with PMH of asthma, Holly Nielsen? syndrome, anxiety and depression presenting with acute shortness of breath, wheeze and chest tightness and admitted for asthma exacerbation.  She reportedly ran out of her albuterol nebulizer.  She also reports mold exposure at work.  She was not hypoxic.  CXR, CMP, CBC with differential, troponin, EKG, VBG and D-dimer without significant finding.  She was started on IV Solu-Medrol and nebulizers with improvement in her symptoms. ? ?On the day of discharge, symptoms improved.  Maintained saturation greater than 94% with ambulation on room air.  Discharged on p.o. prednisone and home Advair.  Renewed her prescription for albuterol nebulizer.  Ambulatory referral to pulmonology ordered.  TOC assisted with PCP.  ?

## 2022-03-14 NOTE — Assessment & Plan Note (Signed)
Stable. Continue home Paxil  

## 2022-03-24 ENCOUNTER — Other Ambulatory Visit: Payer: Self-pay

## 2022-04-12 ENCOUNTER — Other Ambulatory Visit: Payer: Self-pay

## 2022-04-12 ENCOUNTER — Encounter (HOSPITAL_COMMUNITY): Payer: Self-pay | Admitting: Emergency Medicine

## 2022-04-12 ENCOUNTER — Emergency Department (HOSPITAL_COMMUNITY)
Admission: EM | Admit: 2022-04-12 | Discharge: 2022-04-12 | Disposition: A | Discharge status: Home / Self Care | Payer: Medicaid Other | Attending: Emergency Medicine | Admitting: Emergency Medicine

## 2022-04-12 ENCOUNTER — Emergency Department (HOSPITAL_COMMUNITY): Payer: Medicaid Other

## 2022-04-12 DIAGNOSIS — J4541 Moderate persistent asthma with (acute) exacerbation: Secondary | ICD-10-CM | POA: Insufficient documentation

## 2022-04-12 DIAGNOSIS — R0602 Shortness of breath: Secondary | ICD-10-CM | POA: Diagnosis present

## 2022-04-12 DIAGNOSIS — J45901 Unspecified asthma with (acute) exacerbation: Secondary | ICD-10-CM

## 2022-04-12 LAB — CBC
HCT: 40.6 % (ref 36.0–46.0)
Hemoglobin: 13.9 g/dL (ref 12.0–15.0)
MCH: 28.7 pg (ref 26.0–34.0)
MCHC: 34.2 g/dL (ref 30.0–36.0)
MCV: 83.7 fL (ref 80.0–100.0)
Platelets: 231 10*3/uL (ref 150–400)
RBC: 4.85 MIL/uL (ref 3.87–5.11)
RDW: 13.7 % (ref 11.5–15.5)
WBC: 9.5 10*3/uL (ref 4.0–10.5)
nRBC: 0 % (ref 0.0–0.2)

## 2022-04-12 LAB — BASIC METABOLIC PANEL
Anion gap: 6 (ref 5–15)
BUN: 12 mg/dL (ref 6–20)
CO2: 26 mmol/L (ref 22–32)
Calcium: 9.4 mg/dL (ref 8.9–10.3)
Chloride: 105 mmol/L (ref 98–111)
Creatinine, Ser: 0.86 mg/dL (ref 0.44–1.00)
GFR, Estimated: >60 mL/min (ref >60)
Glucose, Bld: 108 mg/dL — ABNORMAL HIGH (ref 70–99)
Potassium: 4.2 mmol/L (ref 3.5–5.1)
Sodium: 137 mmol/L (ref 135–145)

## 2022-04-12 LAB — I-STAT BETA HCG BLOOD, ED (MC, WL, AP ONLY): I-stat hCG, quantitative: <5 m[IU]/mL (ref <5)

## 2022-04-12 MED ORDER — METOCLOPRAMIDE HCL 5 MG/ML IJ SOLN
10.0000 mg | Freq: Once | INTRAMUSCULAR | Status: AC
  Administered 2022-04-12: 10 mg via INTRAVENOUS
  Filled 2022-04-12: qty 2

## 2022-04-12 MED ORDER — MAGNESIUM SULFATE 2 GM/50ML IV SOLN
2.0000 g | Freq: Once | INTRAVENOUS | Status: AC
  Administered 2022-04-12: 2 g via INTRAVENOUS
  Filled 2022-04-12: qty 50

## 2022-04-12 MED ORDER — FLUTICASONE PROPIONATE HFA 44 MCG/ACT IN AERO
2.0000 | INHALATION_SPRAY | Freq: Once | RESPIRATORY_TRACT | Status: AC
Start: 2022-04-12 — End: 2022-04-12
  Administered 2022-04-12: 2 via RESPIRATORY_TRACT
  Filled 2022-04-12: qty 10.6

## 2022-04-12 MED ORDER — IPRATROPIUM-ALBUTEROL 0.5-2.5 (3) MG/3ML IN SOLN
3.0000 mL | Freq: Once | RESPIRATORY_TRACT | Status: AC
Start: 2022-04-12 — End: 2022-04-12
  Administered 2022-04-12: 3 mL via RESPIRATORY_TRACT
  Filled 2022-04-12: qty 3

## 2022-04-12 MED ORDER — IPRATROPIUM BROMIDE 0.02 % IN SOLN
0.5000 mg | Freq: Once | RESPIRATORY_TRACT | Status: AC
Start: 2022-04-12 — End: 2022-04-12
  Administered 2022-04-12: 0.5 mg via RESPIRATORY_TRACT
  Filled 2022-04-12: qty 2.5

## 2022-04-12 MED ORDER — DIPHENHYDRAMINE HCL 25 MG PO CAPS
25.0000 mg | ORAL_CAPSULE | Freq: Once | ORAL | Status: DC
Start: 2022-04-12 — End: 2022-04-12

## 2022-04-12 MED ORDER — DIPHENHYDRAMINE HCL 12.5 MG/5ML PO ELIX
12.5000 mg | ORAL_SOLUTION | Freq: Once | ORAL | Status: AC
  Administered 2022-04-12: 12.5 mg via ORAL
  Filled 2022-04-12: qty 5

## 2022-04-12 NOTE — ED Triage Notes (Signed)
Per EMS pt complains of shob. Asthma hx.  Out of meds for a while. Diffuse wheezing noted by EMS.  Has had 10 of albuterol, 0.5 atrovent neb.  125 solu medrol.  160/100, HR 100, O2 95% on room air.   ?

## 2022-04-12 NOTE — ED Provider Notes (Addendum)
?[facility] ?Provider Note ? ? ?CSN: [id number] ?Arrival date & time: 04/12/22  1524 ? ?  ? ?History ? ?Chief Complaint  ?Patient presents with  ? Shortness of Breath  ? ? ?Holly Nielsen is a 31 y.o. female. ? ? ?Shortness of Breath ? ?Patient is a 31 year old female with past medical history significant for severe asthma.  She states she has been out of all her albuterol inhaler since yesterday evening.  She states she is also ran out of her nebulizer solution.  She states that she was hospitalized 3/16 for 1 day and states that she still felt short of breath and was wheezing when she was discharged home.  She denies any chest pain she states this feels exactly how her asthma normally feels.  She states that she does not have a steroid inhaler anymore she used to use Advair discus however this is run out as well. ? ? ?She received Solu-Medrol, 10 mg of albuterol, 0.5 of Atrovent nebulizer and had some improvement by EMS however continues to feel quite short of breath. ? ?No recent surgeries, long travel, hemoptysis, estrogen containing OCP, cancer history.  No unilateral leg swelling.  No history of PE or VTE.  ?  ? ?Home Medications ?Prior to Admission medications   ?Medication Sig Start Date End Date Taking? Authorizing Provider  ?acetaminophen (TYLENOL) 500 MG tablet Take 1,000 mg by mouth every 6 (six) hours as needed for moderate pain.    [provider]  ?albuterol (PROVENTIL) (2.5 MG/3ML) 0.083% nebulizer solution Inhale 3 mLs by nebulization every 6 (six) hours as needed for shortness of breath or wheezing. 03/14/22   Mercy Riding, MD  ?albuterol (VENTOLIN HFA) 108 (90 Base) MCG/ACT inhaler Inhale 2 puffs into the lungs every 6 (six) hours as needed for wheezing. 11/11/21   Evlyn Courier, PA-C  ?EPINEPHrine 0.3 mg/0.3 mL IJ SOAJ injection Inject into the muscle daily. 07/08/21   [provider]  ?fluticasone-salmeterol (ADVAIR) 250-50 MCG/ACT AEPB Inhale 1  puff into the lungs in the morning and at bedtime. 12/31/18   [provider]  ?PARoxetine (PAXIL-CR) 12.5 MG 24 hr tablet Take 12.5 mg by mouth every morning. 07/08/21   [provider]  ?predniSONE (DELTASONE) 50 MG tablet Take 1 tablet (50 mg total) by mouth daily with breakfast. 03/15/22   Mercy Riding, MD  ?   ? ?Allergies    ?Celery oil, Corn oil, Iron sucrose, Lac bovis, Nutricap [actical], Onion, Other, Pedi-pre tape spray [wound dressing adhesive], and Vanilla   ? ?Review of Systems   ?Review of Systems  ?Respiratory:  Positive for shortness of breath.   ? ?Physical Exam ?Updated Vital Signs ?BP (!) 158/95   Pulse (!) 108   Temp 98.2 ?F (36.8 ?C) (Oral)   Resp 18   LMP 03/26/2022 (Exact Date)   SpO2 97%  ?Physical Exam ?Vitals and nursing note reviewed.  ?Constitutional:   ?   General: She is in acute distress.  ?   Appearance: She is obese. She is not ill-appearing, toxic-appearing or diaphoretic.  ?HENT:  ?   Head: Normocephalic and atraumatic.  ?   Nose: Nose normal.  ?Eyes:  ?   General: No scleral icterus. ?Cardiovascular:  ?   Rate and Rhythm: Normal rate and regular rhythm.  ?   Pulses: Normal pulses.  ?   Heart sounds: Normal heart sounds.  ?Pulmonary:  ?   Effort: No respiratory distress.  ?  Breath sounds: Wheezing present.  ?   Comments: Tachypnea, increased work of breathing, prolonged expiratory phase ?Diffuse wheezing ?Abdominal:  ?   Palpations: Abdomen is soft.  ?   Tenderness: There is no abdominal tenderness.  ?   Comments: No abdominal tenderness guarding or rebound.  ?Musculoskeletal:  ?   Cervical back: Normal range of motion.  ?   Right lower leg: No edema.  ?   Left lower leg: No edema.  ?   Comments: No lower extremity edema.  No calf tenderness  ?Skin: ?   General: Skin is warm and dry.  ?   Capillary Refill: Capillary refill takes less than 2 seconds.  ?Neurological:  ?   Mental Status: She is alert. Mental status is at baseline.  ?Psychiatric:     ?   Mood and  Affect: Mood normal.     ?   Behavior: Behavior normal.  ? ? ?ED Results / Procedures / Treatments   ?Labs ?(all labs ordered are listed, but only abnormal results are displayed) ?Labs Reviewed  ?BASIC METABOLIC PANEL - Abnormal; Notable for the following components:  ?    Result Value  ? Glucose, Bld 108 (*)   ? All other components within normal limits  ?CBC  ?I-STAT BETA HCG BLOOD, ED ([facility], [facility], [facility] ONLY)  ? ? ?EKG ?None ? ?Radiology ?DG Chest Portable 1 View ? ?Result Date: 04/12/2022 ?CLINICAL DATA:  Pt reported increased SOB and coughing today. Pt stated she has been out of her breathing treatment home meds for about a week. Hx of asthma. EXAM: PORTABLE CHEST 1 VIEW COMPARISON:  03/13/2022. FINDINGS: Cardiac silhouette normal in size. Normal mediastinal and hilar contours. Clear lungs.  No pleural effusion or pneumothorax. Skeletal structures are grossly intact. IMPRESSION: No active disease. Electronically Signed   By: Lajean Manes M.D.   On: 04/12/2022 16:26   ? ?Procedures ?[facility] Care ?Performed by: Tedd Sias, PA ?Authorized by: Tedd Sias, PA  ? ?Critical care provider statement:  ?  Critical care time (minutes):  35 ?  Critical care time was exclusive of:  Separately billable procedures and treating other patients and teaching time ?  Critical care was necessary to treat or prevent imminent or life-threatening deterioration of the following conditions: resp distress requiring numerous treatments and reassessments. ?  Critical care was time spent personally by me on the following activities:  Development of treatment plan with patient or surrogate, review of old charts, re-evaluation of patient's condition, pulse oximetry, ordering and review of radiographic studies, ordering and review of laboratory studies, ordering and performing treatments and interventions, obtaining history from patient or surrogate, examination of patient and evaluation of patient's response to treatment ?  Care  discussed with: admitting provider    ? ? ?Medications Ordered in ED ?Medications  ?fluticasone (FLOVENT HFA) 44 MCG/ACT inhaler 2 puff (2 puffs Inhalation Given 04/12/22 1802)  ?ipratropium-albuterol (DUONEB) 0.5-2.5 (3) MG/3ML nebulizer solution 3 mL (3 mLs Nebulization Given 04/12/22 1644)  ?ipratropium (ATROVENT) nebulizer solution 0.5 mg (0.5 mg Nebulization Given 04/12/22 1643)  ?magnesium sulfate IVPB 2 g 50 mL (0 g Intravenous Stopped 04/12/22 1750)  ?metoCLOPramide (REGLAN) injection 10 mg (10 mg Intravenous Given 04/12/22 1646)  ?diphenhydrAMINE (BENADRYL) 12.5 MG/5ML elixir 12.5 mg (12.5 mg Oral Given 04/12/22 1651)  ? ? ?ED Course/ Medical Decision Making/ A&P ?  ?                        ?  Medical Decision Making ?Amount and/or Complexity of Data Reviewed ?Labs: ordered. ?Radiology: ordered. ? ?Risk ?Prescription drug management. ? ? ?This patient presents to the ED for concern of SOB/wheezing, this involves a number of treatment options, and is a complaint that carries with it a high risk of complications and morbidity.  The differential diagnosis includes  ?The causes for shortness of breath include but are not limited to ?Cardiac (AHF, pericardial effusion and tamponade, arrhythmias, ischemia, etc) ?Respiratory (COPD, asthma, pneumonia, pneumothorax, primary pulmonary hypertension, PE/VQ mismatch) ?Hematological (anemia) ?Neuromuscular (ALS, Guillain-Barr?, etc)  ? ? ?Co morbidities: ?Discussed in HPI ? ? ?Brief History: ? ?Patient is a 31 year old female with past medical history significant for severe asthma.  She states she has been out of all her albuterol inhaler since yesterday evening.  She states she is also ran out of her nebulizer solution.  She states that she was hospitalized 3/16 for 1 day and states that she still felt short of breath and was wheezing when she was discharged home.  She denies any chest pain she states this feels exactly how her asthma normally feels.  She states that she does  not have a steroid inhaler anymore she used to use Advair discus however this is run out as well. ? ? ?She received Solu-Medrol, 10 mg of albuterol, 0.5 of Atrovent nebulizer and had some improvement by EMS howeve

## 2022-04-12 NOTE — Discharge Instructions (Signed)
Please take the prednisone taper as prescribed.  Please keep your appointment with your pulmonologist.  Use the Flovent inhaler 2 puffs once daily ?Continue using the antihistamine-Zyrtec or Allegra-daily. ? ?Avoid smoking, try to avoid pollen is much as you can. ?

## 2022-04-25 ENCOUNTER — Ambulatory Visit: Payer: Medicaid Other | Admitting: Pulmonary Disease

## 2022-04-25 ENCOUNTER — Encounter: Payer: Self-pay | Admitting: Pulmonary Disease

## 2022-04-25 ENCOUNTER — Other Ambulatory Visit (HOSPITAL_COMMUNITY): Payer: Self-pay

## 2022-04-25 ENCOUNTER — Other Ambulatory Visit: Payer: Self-pay

## 2022-04-25 ENCOUNTER — Telehealth: Payer: Self-pay | Admitting: Pharmacist

## 2022-04-25 VITALS — BP 140/88 | HR 87 | Ht 60.0 in | Wt 277.0 lb

## 2022-04-25 DIAGNOSIS — J4551 Severe persistent asthma with (acute) exacerbation: Secondary | ICD-10-CM | POA: Diagnosis not present

## 2022-04-25 MED ORDER — PREDNISONE 10 MG PO TABS
ORAL_TABLET | ORAL | 0 refills | Status: AC
  Filled 2022-04-25 (×2): qty 20, 8d supply, fill #0

## 2022-04-25 MED ORDER — FLUTICASONE-SALMETEROL 500-50 MCG/ACT IN AEPB
1.0000 | INHALATION_SPRAY | Freq: Two times a day (BID) | RESPIRATORY_TRACT | 11 refills | Status: DC
  Filled 2022-04-25 (×2): qty 60, 30d supply, fill #0

## 2022-04-25 MED ORDER — MONTELUKAST SODIUM 10 MG PO TABS
10.0000 mg | ORAL_TABLET | Freq: Every day | ORAL | 11 refills | Status: DC
  Filled 2022-04-25 (×2): qty 30, 30d supply, fill #0
  Filled 2022-09-28: qty 30, 30d supply, fill #1

## 2022-04-25 MED ORDER — ALBUTEROL SULFATE HFA 108 (90 BASE) MCG/ACT IN AERS
2.0000 | INHALATION_SPRAY | Freq: Four times a day (QID) | RESPIRATORY_TRACT | 0 refills | Status: DC | PRN
  Filled 2022-04-25: qty 6.7, 25d supply, fill #0
  Filled 2022-04-25: qty 6.7, 30d supply, fill #0

## 2022-04-25 MED ORDER — ALBUTEROL SULFATE (2.5 MG/3ML) 0.083% IN NEBU
3.0000 mL | INHALATION_SOLUTION | Freq: Four times a day (QID) | RESPIRATORY_TRACT | 5 refills | Status: DC | PRN
  Filled 2022-04-25: qty 180, 15d supply, fill #0
  Filled 2022-04-25: qty 360, 30d supply, fill #0
  Filled 2022-08-29: qty 180, 15d supply, fill #1
  Filled 2022-09-28: qty 180, 15d supply, fill #2
  Filled 2022-10-27: qty 180, 15d supply, fill #3

## 2022-04-25 NOTE — Progress Notes (Signed)
? ? ?Subjective:  ? ?PATIENT ID: Holly Nielsen GENDER: female DOB: [birth year], MRN: [id number] ? ? ?HPI ? ?Chief Complaint  ?Patient presents with  ? Consult  ?  Asthma   ? ? ?Reason for Visit: New consult for asthma ? ?Ms. 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 ? ?Since September 2022 she has had 5 ED visits for viral illnesses/respiratory complaints including COVID 19 in January 2023.  She was briefly hospitalized from 3/16 to 03/14/2022 for asthma exacerbation requiring nebulizers IV steroids.  She was discharged on prednisone and Advair pulmonary referral placed.  She was seen again in the ED on 04/12/2022 after running out of nebulizer solution and Advair and having symptoms of shortness of breath and wheezing ? ?She has moved from [facility] to [facility]. Previously on Xolair, last taken in 2021. At baseline she has shortness of breath, cough and wheezing. Symptoms triggered by allergens and illness. Able to walk and push through her symptoms. 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 Uses albuterol several times a day. She walks her dog daily 2 miles. ? ?Social History: ?Never smoker ? ?Past Medical History:  ?Diagnosis Date  ? Asthma   ? Guillain-Barre (HCC)   ?  ? ?Family History  ?Problem Relation Age of Onset  ? Asthma Father   ? Stroke Paternal Grandfather   ? Diabetes Mellitus I Paternal Grandmother   ?  ? ?Social History  ? ?Occupational History  ? Not on file  ?Tobacco Use  ? Smoking status: Never  ? Smokeless tobacco: Never  ?Vaping Use  ? Vaping Use: Never used  ?Substance and Sexual Activity  ? Alcohol use: Never  ? Drug use: Never  ? Sexual activity: Yes  ? ? ?Allergies  ?Allergen Reactions  ? Celery Oil Diarrhea  ? Corn Oil Diarrhea  ?  rash  ? Iron Sucrose   ?  Other reaction(s): Dizziness, Other (See Comments), Other - See Comments ?Pt became flushed, hot and diaphoretic after  completion of infusion.  ?Pt became flushed, hot and diaphoretic after completion of infusion.  ?  ? Lac Bovis Diarrhea  ? Nutricap [Actical] Diarrhea  ? Onion Diarrhea  ? Other Nausea And Vomiting  ?  Green bell peppers  ? Pedi-Pre Tape Spray [Wound Dressing Adhesive] Hives  ? Vanilla Diarrhea  ?  ? ?Outpatient Medications Prior to Visit  ?Medication Sig Dispense Refill  ? acetaminophen (TYLENOL) 500 MG tablet Take 1,000 mg by mouth every 6 (six) hours as needed for moderate pain.    ? EPINEPHrine 0.3 mg/0.3 mL IJ SOAJ injection Inject into the muscle daily.    ? PARoxetine (PAXIL-CR) 12.5 MG 24 hr tablet Take 12.5 mg by mouth every morning.    ? albuterol (PROVENTIL) (2.5 MG/3ML) 0.083% nebulizer solution Inhale 3 mLs by nebulization every 6 (six) hours as needed for shortness of breath or wheezing. 90 mL 1  ? albuterol (VENTOLIN HFA) 108 (90 Base) MCG/ACT inhaler Inhale 2 puffs into the lungs every 6 (six) hours as needed for wheezing. 6.7 g 0  ? fluticasone-salmeterol (ADVAIR) 250-50 MCG/ACT AEPB Inhale 1 puff into the lungs in the morning and at bedtime.    ? predniSONE (DELTASONE) 50 MG tablet Take 1 tablet (50 mg total) by mouth daily with breakfast. 4 tablet 0  ? ?No facility-administered medications prior to visit.  ? ? ?Review of Systems  ?Constitutional:  Negative for  chills, diaphoresis, fever, malaise/fatigue and weight loss.  ?HENT:  Negative for congestion.   ?Respiratory:  Positive for cough, shortness of breath and wheezing. Negative for hemoptysis and sputum production.   ?Cardiovascular:  Negative for chest pain, palpitations and leg swelling.  ? ? ?Objective:  ? ?Vitals:  ? 04/25/22 1005  ?BP: 140/88  ?Pulse: 87  ?SpO2: 98%  ?Weight: 277 lb (125.6 kg)  ?Height: 5' (1.524 m)  ? ?SpO2: 98 % ?O2 Device: None (Room air) ? ?Physical Exam: ?General: Well-appearing, no acute distress ?HENT: [place], AT ?Eyes: EOMI, no scleral icterus ?Respiratory: Diminished breath sounds bilaterally.  No crackles, wheezing  or rales ?Cardiovascular: RRR, -M/R/G, no JVD ?Extremities:-Edema,-tenderness ?Neuro: AAO x4, CNII-XII grossly intact ?Psych: Normal mood, normal affect ? ?Data Reviewed: ? ?Imaging: ?CXR 04/12/2022-no acute infiltrate ? ?PFT: ?None on file ? ?Labs: ?CBC ?   ?Component Value Date/Time  ? WBC 9.5 04/12/2022 1650  ? RBC 4.85 04/12/2022 1650  ? HGB 13.9 04/12/2022 1650  ? HCT 40.6 04/12/2022 1650  ? PLT 231 04/12/2022 1650  ? MCV 83.7 04/12/2022 1650  ? MCH 28.7 04/12/2022 1650  ? MCHC 34.2 04/12/2022 1650  ? RDW 13.7 04/12/2022 1650  ? LYMPHSABS 2.4 03/13/2022 1840  ? MONOABS 0.7 03/13/2022 1840  ? EOSABS 0.4 03/13/2022 1840  ? BASOSABS 0.0 03/13/2022 1840  ? ?Absolute eos 03/13/22 - 400 ? ?   ?Assessment & Plan:  ? 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 Uncontrolled symptoms on current regimen with frequent ED visits in the last year. Previously well-controlled on Xolair which was last taken two years ago. ? ?Discussed biologic agents including Xolair (anti-IgE), Nucala (anti-IL-5), Fasenra (anti-IL-5 receptor alpha) and Dupixent (anti-IL-4 receptor subunit alpha). I believe patient would benefit from biologic agent given uncontrolled symptoms, multiple exacerbations and eosinophilia. We discussed the risks and benefits of these type of medications including anaphylaxis. Patient expressed understanding and would like to pursue treatment if eligible. ? ?Discussed clinical course and management of asthma including bronchodilator regimen and action plan for exacerbation. ? ?Severe persistent asthma with exacerbation ?--Prednisone taper for active symptoms ?--ENROLL in Xolair ?--START Advair 500-50 mcg ONE puff TWICE a day ?--START singulair 10 mg daily ?--REFILLED albuterol inhaler and nebulizers ?--ORDER labs: IgE ? 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 ? ?Health Maintenance ?Immunization History  ?Administered Date(s) Administered  ? Influenza Split 09/25/2011  ? ? ?Orders Placed This Encounter  ?Procedures  ? IgE  ?  Standing Status:   Future  ?  Standing Expiration Date:   04/26/2023  ? ?Meds ordered this encounter  ?Medications  ? fluticasone-salmeterol (ADVAIR DISKUS) 500-50 MCG/ACT AEPB  ?  Sig: Inhale 1 puff into the lungs in the morning and at bedtime.  ?  Dispense:  60 each  ?  Refill:  11  ? albuterol (PROVENTIL) (2.5 MG/3ML) 0.083% nebulizer solution  ?  Sig: Take 3 mLs by nebulization every 6 (six) hours as needed for shortness of breath or wheezing.  ?  Dispense:  360 mL  ?  Refill:  5  ?  Please deliver meds to bedside before discharge  ? albuterol (VENTOLIN HFA) 108 (90 Base) MCG/ACT inhaler  ?  Sig: Inhale 2 puffs into the lungs every 6 (six) hours as needed for wheezing.  ?  Dispense:  6.7  g  ?  Refill:  0  ? montelukast (SINGULAIR) 10 MG tablet  ?  Sig: Take 1 tablet (10 mg total) by mouth at bedtime.  ?  Dispense:  30 tablet  ?  Refill:  11  ? predniSONE (DELTASONE) 10 MG tablet  ?  Sig: Take 4 tablets (40 mg total) by mouth daily with breakfast for 2 days, THEN 3 tablets (30 mg total) daily with breakfast for 2 days, THEN 2 tablets (20 mg total) daily with breakfast for 2 days, THEN 1 tablet (10 mg total) daily with breakfast for 2 days.  ?  Dispense:  20 tablet  ?  Refill:  0  ? ? ?Return in about 3 months (around 07/25/2022). ? ?I have spent a total time of 60-minutes on the day of the appointment reviewing prior documentation, coordinating care and discussing medical diagnosis and plan with the patient/family. Imaging, labs and tests included in this note have been reviewed and interpreted independently by me. ? ?Charleston Hankin Mechele Collin, MD ?[facility] ?04/25/2022 10:32 AM  ?Office Number [phone] ? ? ?

## 2022-04-25 NOTE — Progress Notes (Signed)
Xolair BIV started in separate telephone encounter for pharmacy follow-up. Once approved, she will need to be restarted in clinic with 2 hour monitoring period and Epipen on hand ? ?Knox Saliva, PharmD, MPH, BCPS, CPP ?Clinical Pharmacist (Rheumatology and Pulmonology) ?

## 2022-04-25 NOTE — Telephone Encounter (Addendum)
Xolair dose pending IgE lab that was drawn at [facility] today. ?Weight 125.6kg ? ?Patient has [place] - unsure if [facility] would be able to fill for patient if approved ? ?Chesley Mires, PharmD, MPH, BCPS, CPP ?Clinical Pharmacist (Rheumatology and Pulmonology) ? ?----- Message from Chi Mechele Collin, MD sent at 04/25/2022 10:32 AM EDT ----- ?Please start enrollment for Xolair. Patient previously on medication when she lived in [place] and last received ~2 years ago. Paperwork given to patient ?

## 2022-04-25 NOTE — Patient Instructions (Signed)
?  Severe persistent asthma with exacerbation ?--Prednisone taper for active symptoms ?--ENROLL in Xolair ?--START Advair 500-50 mcg ONE puff TWICE a day ?--START singulair 10 mg daily ?--REFILLED albuterol inhaler and nebulizers ?--ORDER labs: IgE ? 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 ?Follow-up with me in 3 months (July) ?

## 2022-04-28 ENCOUNTER — Other Ambulatory Visit (HOSPITAL_COMMUNITY): Payer: Self-pay

## 2022-04-28 ENCOUNTER — Other Ambulatory Visit: Payer: Self-pay

## 2022-04-28 LAB — IGE: IgE (Immunoglobulin E), Serum: 97 kU/L (ref <=114)

## 2022-04-28 NOTE — Telephone Encounter (Signed)
ATC patient to determine if she is planning to switch to [facility] Medicaid.  We are still awaiting IgE level to result so we can determine appropriate dosing. ? ?Chesley Mires, PharmD, MPH, BCPS, CPP ?Clinical Pharmacist (Rheumatology and Pulmonology) ?

## 2022-04-30 ENCOUNTER — Other Ambulatory Visit (HOSPITAL_COMMUNITY): Payer: Self-pay

## 2022-04-30 NOTE — Telephone Encounter (Signed)
OK for Xolair dose is 300mg  every 4 weeks ?

## 2022-04-30 NOTE — Telephone Encounter (Addendum)
Based on most recent IgE of 97 (04/25/22) and wt 125.6kg ---> Xolair dose is 300mg  every 4 weeks ? ?Based on previous IgE of 184 on 12/30/2018 and weight of 113.9 kg ---> Xolair dose was 225mg  every 2 weeks ? ?02/28/2019, PharmD, MPH, BCPS, CPP ?Clinical Pharmacist (Rheumatology and Pulmonology)=  ?

## 2022-05-01 ENCOUNTER — Other Ambulatory Visit (HOSPITAL_COMMUNITY): Payer: Self-pay

## 2022-05-01 ENCOUNTER — Telehealth: Payer: Self-pay

## 2022-05-01 NOTE — Telephone Encounter (Signed)
Received a fax regarding Prior Authorization from  [place] MEDICAID  for Holly Nielsen. Authorization has been DENIED because: ? ?"The plan covers the prescribed medication for members who have tried and failed despite being compliant with a combination controller therapy including at least a medium dose of inhaled corticosteroid and one of the following: long acting beta agonist (LABA), long acting muscarinic antagonist, theophylline, or leukotriene modifier. ? ?The plan covers the prescribed medication for members who have had a positive skin test to a perennial aeroallergen and the provider attests that source of allergenic asthma trigger has been removed or addressed. ? ?The plan covers the prescribed medication for members whose baseline IgE is between 30 to 700 IU/mL in adults OR 30-1300 IU/mL for pediatric patients between 78 and 69 years of age." ? ?Chart notes that were submitted stated compliance with Advair 250, however Singulair appears to have been added during that encounter. Will need to include previous skin test results and IgE labs when submitting appeal. ? ?Appeals Phone# [phone] ?Fax# [phone] ?

## 2022-05-01 NOTE — Telephone Encounter (Signed)
Submitted a FAXED Prior Authorization request to  [place] MEDICAID  for XOLAIR via CoverMyMeds. Will update once we receive a response. ? ? ?Key: [id number]  ?

## 2022-05-01 NOTE — Telephone Encounter (Signed)
Opened in error

## 2022-05-15 NOTE — Telephone Encounter (Signed)
Submitted an URGENT appeal to [employer] for [employer].  Phone: [phone] Fax: [phone]  Chesley Mires, PharmD, MPH, BCPS, CPP Clinical Pharmacist (Rheumatology and Pulmonology)

## 2022-05-19 ENCOUNTER — Other Ambulatory Visit (HOSPITAL_COMMUNITY): Payer: Self-pay

## 2022-05-19 NOTE — Telephone Encounter (Signed)
Received fax from [employer] stating that original [place] denial has been overturned and pt is now APPROVED from 05/16/2022 to 05/16/2023. Approval letter sent to scan center.  Per test claim results pt is locked in to another pharmacy, however it is unclear as to which specific pharmacy this is. [facility] is listed in the breakdown of the billing info, so it may be likely that pt will need to use [facility] to fill medication.   Auth# [id number] Phone# [phone]

## 2022-05-20 ENCOUNTER — Other Ambulatory Visit: Payer: Self-pay

## 2022-05-20 MED ORDER — EPINEPHRINE 0.3 MG/0.3ML IJ SOAJ
0.3000 mg | INTRAMUSCULAR | 5 refills | Status: DC | PRN
  Filled 2022-05-20: qty 1, fill #0
  Filled 2022-05-23: qty 1, 30d supply, fill #0

## 2022-05-20 NOTE — Telephone Encounter (Signed)
Spoke with patient regarding Xolair approval. She states that she was not previosly self-administering - was receiving at MD office. I inquired if she is comfortable with self-administering injectable medication. 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 Will proceed with self-admin of Xolair 300mg  SQ every 4 weeks.  Patient will need Epipen to restart Xolair. She will need to restart in clinic with 2 hour monitoring period. Rx for Epipen sent to [facility] for her to pick up. Provided her with my direct office number to  reach back out once Epipen on hand so we can schedule Xolair new start visit  [facility], PharmD, MPH, BCPS, CPP Clinical Pharmacist (Rheumatology and Pulmonology)

## 2022-05-23 ENCOUNTER — Other Ambulatory Visit: Payer: Self-pay

## 2022-05-28 NOTE — Telephone Encounter (Signed)
ATC patient to determine if she picked up Epipen. Per dispense report, rx has not been filled. Left VM for patient  Chesley Mires, PharmD, MPH, BCPS, CPP Clinical Pharmacist (Rheumatology and Pulmonology)

## 2022-06-17 ENCOUNTER — Other Ambulatory Visit: Payer: Self-pay

## 2022-06-17 MED ORDER — ALBUTEROL SULFATE HFA 108 (90 BASE) MCG/ACT IN AERS
INHALATION_SPRAY | RESPIRATORY_TRACT | 0 refills | Status: DC
  Filled 2022-06-17: qty 18, 25d supply, fill #0
  Filled 2022-06-17: qty 8.5, 25d supply, fill #0

## 2022-06-17 MED ORDER — PROMETHAZINE-DM 6.25-15 MG/5ML PO SYRP
ORAL_SOLUTION | ORAL | 0 refills | Status: DC
  Filled 2022-06-17: qty 118, 8d supply, fill #0

## 2022-06-17 MED ORDER — PREDNISONE 20 MG PO TABS
ORAL_TABLET | ORAL | 0 refills | Status: DC
  Filled 2022-06-17: qty 12, 6d supply, fill #0

## 2022-06-17 MED ORDER — ALBUTEROL SULFATE (2.5 MG/3ML) 0.083% IN NEBU
INHALATION_SOLUTION | RESPIRATORY_TRACT | 0 refills | Status: DC
  Filled 2022-06-17: qty 90, 8d supply, fill #0

## 2022-06-17 MED ORDER — BENZONATATE 200 MG PO CAPS
ORAL_CAPSULE | ORAL | 0 refills | Status: DC
  Filled 2022-06-17: qty 20, 7d supply, fill #0

## 2022-06-17 NOTE — Telephone Encounter (Signed)
Spoke with patient. She states she hasn't picked up Epipen because copay was $100 through [place]. She states she has a new plan through [employer] active as of 05/29/22. We will need to run new prior authorization this plan  RxBIN [id number] RxPCN [id number] [employer] ID: [id number]  Chesley Mires, PharmD, MPH, BCPS, CPP Clinical Pharmacist (Rheumatology and Pulmonology)

## 2022-06-24 ENCOUNTER — Other Ambulatory Visit (HOSPITAL_COMMUNITY): Payer: Self-pay

## 2022-06-24 ENCOUNTER — Other Ambulatory Visit: Payer: Self-pay

## 2022-06-24 MED ORDER — EPINEPHRINE 0.3 MG/0.3ML IJ SOAJ
0.3000 mg | INTRAMUSCULAR | 5 refills | Status: DC | PRN
  Filled 2022-06-24: qty 2, 30d supply, fill #0
  Filled 2022-07-11: qty 2, 2d supply, fill #0
  Filled 2022-11-03: qty 2, 30d supply, fill #1

## 2022-06-26 ENCOUNTER — Other Ambulatory Visit (HOSPITAL_COMMUNITY): Payer: Self-pay

## 2022-06-26 NOTE — Telephone Encounter (Addendum)
Epipen ready to dispense at pharmacy. Will await patient to pick up prior to schedule Xolair new start visit  ATC patient to notify that Epipen can be picked up. Left VM advising that she can call me back once she picks up medication.  Chesley Mires, PharmD, MPH, BCPS, CPP Clinical Pharmacist (Rheumatology and Pulmonology)

## 2022-07-02 ENCOUNTER — Other Ambulatory Visit: Payer: Self-pay

## 2022-07-03 ENCOUNTER — Emergency Department (HOSPITAL_COMMUNITY)
Admission: EM | Admit: 2022-07-03 | Discharge: 2022-07-03 | Disposition: A | Discharge status: Home / Self Care | Payer: 59 | Attending: Emergency Medicine | Admitting: Emergency Medicine

## 2022-07-03 ENCOUNTER — Emergency Department (HOSPITAL_COMMUNITY): Payer: 59

## 2022-07-03 ENCOUNTER — Other Ambulatory Visit: Payer: Self-pay

## 2022-07-03 ENCOUNTER — Encounter (HOSPITAL_COMMUNITY): Payer: Self-pay

## 2022-07-03 DIAGNOSIS — R2689 Other abnormalities of gait and mobility: Secondary | ICD-10-CM | POA: Diagnosis not present

## 2022-07-03 DIAGNOSIS — M25562 Pain in left knee: Secondary | ICD-10-CM | POA: Diagnosis present

## 2022-07-03 MED ORDER — LIDOCAINE 5 % EX PTCH
2.0000 | MEDICATED_PATCH | CUTANEOUS | Status: DC
Start: 2022-07-03 — End: 2022-07-03
  Administered 2022-07-03: 2 via TRANSDERMAL
  Filled 2022-07-03: qty 2

## 2022-07-03 NOTE — ED Triage Notes (Signed)
Pt presents to ED from home, pt states she slipped, did not fall, but heard and felt a pop behind her left knee. Swelling noted to thigh, patella, and underneath knee cap. Pedal pulse intact. Pt endorses taking OTC tylenol today, unable to bear weight.

## 2022-07-03 NOTE — Discharge Instructions (Signed)
You were seen in the ER today for your knee pain.  Please call the orthopedist listed below, Dr. Everardo Pacific for close outpatient follow-up.  He did not have any broken bones or dislocations on your x-rays today.  You likely have muscle strains of the left leg and possible meniscal or ligamentous injury within the left knee.  You may use provided brace and crutches as needed until you follow-up with your orthopedist and return to the ER with any severe symptoms.    To help with your pain you may take Tylenol and / or NSAID medication (such as ibuprofen or naproxen) to help with your pain.  You may also utilize topical pain relief such as Biofreeze, IcyHot, or topical lidocaine patches.  I also recommend that you apply heat to the area, such as a hot shower or heating pad, and follow heat application with massage of the muscles that are most tight.

## 2022-07-03 NOTE — ED Provider Notes (Signed)
[facility]-EMERGENCY DEPT Provider Note   CSN: [id number] Arrival date & time: 07/03/22  0051     History  No chief complaint on file.   Holly Nielsen is a 31 y.o. female who presents with concern for pain in the left knee.  She states that when she was at work yesterday she slipped on some water on the floor and did "something like the splits".  She did not fall the way but heard and felt a pop behind her left knee with subsequent swelling to the distal thigh, anterior knee, and middle of her knee.  She states that she has been walking though barely able to bear weight on the left leg with exquisite pain despite Tylenol, ibuprofen, Biofreeze, ice, and elevation.  Denies any numbness, ting, weakness in the leg at this time. I personally read this patient's medical records.  She has history of Guillain-Barr syndrome iron deficiency anemia.   HPI     Home Medications Prior to Admission medications   Medication Sig Start Date End Date Taking? Authorizing Provider  acetaminophen (TYLENOL) 500 MG tablet Take 1,000 mg by mouth every 6 (six) hours as needed for moderate pain.    [provider]  albuterol (PROVENTIL) (2.5 MG/3ML) 0.083% nebulizer solution Take 3 mLs by nebulization every 6 (six) hours as needed for shortness of breath or wheezing. 04/25/22   Luciano Cutter, MD  albuterol (PROVENTIL) (2.5 MG/3ML) 0.083% nebulizer solution Take 3 mL (2.5 mg total) by nebulization every 6 (six) hours if needed for wheezing. 06/17/22     albuterol (VENTOLIN HFA) 108 (90 Base) MCG/ACT inhaler Inhale 2 puffs into the lungs every 6 (six) hours as needed for wheezing. 04/25/22   Luciano Cutter, MD  albuterol (PROAIR HFA) 108 (90 Base) MCG/ACT inhaler Inhale 2 puffs every 6 (six) hours if needed for wheezing. 06/17/22     benzonatate (TESSALON) 200 MG capsule Take 1 capsule (200 mg total) by mouth 3 (three) times a day if needed for cough for up to 7 days. Do not crush  or chew. 06/17/22     EPINEPHrine 0.3 mg/0.3 mL IJ SOAJ injection Inject 0.3 mg into the muscle as needed for anaphylaxis. 06/24/22   Luciano Cutter, MD  fluticasone-salmeterol (ADVAIR DISKUS) 500-50 MCG/ACT AEPB Inhale 1 puff into the lungs in the morning and at bedtime. 04/25/22   Luciano Cutter, MD  montelukast (SINGULAIR) 10 MG tablet Take 1 tablet (10 mg total) by mouth at bedtime. 04/25/22   Luciano Cutter, MD  PARoxetine (PAXIL-CR) 12.5 MG 24 hr tablet Take 12.5 mg by mouth every morning. 07/08/21   [provider]  predniSONE (DELTASONE) 20 MG tablet Take 3 tablets (60 mg total) by mouth 1 (one) time each day for 2 days, THEN 2 tablets (40 mg total) 1 (one) time each day for 2 days, THEN 1 tablet (20 mg total) 1 (one) time each day for 2 days. 06/17/22     promethazine-dextromethorphan (PROMETHAZINE-DM) 6.25-15 MG/5ML syrup Take 5 mL by mouth every 8 (eight) hours if needed for cough. 06/17/22         Allergies    Actical, Celery oil, Corn oil, Iron sucrose, Lac bovis, Nutricap [actical], Onion, Other, Pedi-pre tape spray [wound dressing adhesive], and Vanilla    Review of Systems   Review of Systems  Musculoskeletal:  Positive for joint swelling.    Physical Exam Updated Vital Signs BP (!) 144/110   Pulse 92   Temp  98.1 F (36.7 C) (Oral)   Resp 17   Ht 5' (1.524 m)   Wt 108.9 kg   LMP 06/27/2022   SpO2 97%   BMI 46.87 kg/m  Physical Exam Vitals and nursing note reviewed.  Constitutional:      Appearance: She is obese. She is not ill-appearing or toxic-appearing.  HENT:     Head: Normocephalic and atraumatic.  Eyes:     General: No scleral icterus.       Right eye: No discharge.        Left eye: No discharge.     Conjunctiva/sclera: Conjunctivae normal.  Cardiovascular:     Pulses:          Dorsalis pedis pulses are 1+ on the right side and 1+ on the left side.  Pulmonary:     Effort: Pulmonary effort is normal.  Musculoskeletal:     Left knee:  Swelling present. No effusion or erythema. Decreased range of motion. Tenderness present over the medial joint line. No MCL, LCL, ACL or PCL tenderness.     Instability Tests: Anterior drawer test negative. Posterior drawer test negative.     Right lower leg: Normal.     Left lower leg: Normal.     Right ankle: Normal.     Right Achilles Tendon: Normal.     Left ankle: Normal.     Left Achilles Tendon: Normal.     Right foot: Normal.     Left foot: Normal.     Comments: Unable to assess McMurray tests due to patient's pain and body habitus  Skin:    General: Skin is warm and dry.     Capillary Refill: Capillary refill takes less than 2 seconds.  Neurological:     General: No focal deficit present.     Mental Status: She is alert.     Gait: Gait abnormal.     Comments: Antalgic gait   Psychiatric:        Mood and Affect: Mood normal.     ED Results / Procedures / Treatments   Labs (all labs ordered are listed, but only abnormal results are displayed) Labs Reviewed - No data to display  EKG None  Radiology DG Knee Complete 4 Views Left  Result Date: 07/03/2022 CLINICAL DATA:  Recent fall with knee pain, initial encounter EXAM: LEFT KNEE - COMPLETE 4+ VIEW COMPARISON:  None Available. FINDINGS: No evidence of fracture, dislocation, or joint effusion. No evidence of arthropathy or other focal bone abnormality. Soft tissues are unremarkable. IMPRESSION: No acute abnormality noted. Electronically Signed   By: Alcide Clever M.D.   On: 07/03/2022 03:25    Procedures Procedures   Medications Ordered in ED Medications  lidocaine (LIDODERM) 5 % 2 patch (2 patches Transdermal Patch Applied 07/03/22 0418)    ED Course/ Medical Decision Making/ A&P                           Medical Decision Making 31 year old female who presents with concern for left knee pain after slipping on a wet floor today.  Hypertensive on intake, vitals otherwise normal.  Patient neurovascular intact in  bilateral lower extremities with swelling over the distal left thigh and exquisite tenderness palpation over the medial joint line of the left knee.  No bruising or deformity.  Normal alignment of the knee.  Differential includes not limited to acute fracture/dislocation/ligamentous injury.    Amount and/or Complexity of Data Reviewed Radiology:  ordered.    Details: No acute osseous abnormality on plain film of the left knee, Visualized by this provider.  Risk Prescription drug management.   Suspect muscle strain +/- ligamentous or meniscal injury as etiology of her pain. NO further workup warranted in the ED at this time.   Recommend follow up in the outpatient with orthopedics; will provide brace and crutches today.   Tyshay voiced understanding of her medical evaluation and treatment plan. Each of their questions answered to their expressed satisfaction.  Return precautions were given.  Patient is well-appearing, stable, and was discharged in good condition.  This chart was dictated using voice recognition software, Dragon. Despite the best efforts of this provider to proofread and correct errors, errors may still occur which can change documentation meaning.   Final Clinical Impression(s) / ED Diagnoses Final diagnoses:  Acute pain of left knee    Rx / DC Orders ED Discharge Orders     None         Paris Lore, PA-C 07/03/22 0740    Sloan Leiter, DO 07/07/22 [phone]

## 2022-07-08 ENCOUNTER — Other Ambulatory Visit: Payer: Self-pay

## 2022-07-08 MED ORDER — MELOXICAM 15 MG PO TABS
15.0000 mg | ORAL_TABLET | Freq: Every day | ORAL | 0 refills | Status: DC
  Filled 2022-07-08: qty 30, 30d supply, fill #0

## 2022-07-11 ENCOUNTER — Telehealth: Payer: Self-pay | Admitting: Pulmonary Disease

## 2022-07-11 ENCOUNTER — Other Ambulatory Visit: Payer: Self-pay

## 2022-07-11 MED ORDER — ALBUTEROL SULFATE HFA 108 (90 BASE) MCG/ACT IN AERS
INHALATION_SPRAY | RESPIRATORY_TRACT | 3 refills | Status: DC
  Filled 2022-07-11: qty 8.5, 25d supply, fill #0
  Filled 2022-08-01: qty 8.5, 25d supply, fill #1

## 2022-07-11 MED ORDER — ALBUTEROL SULFATE (2.5 MG/3ML) 0.083% IN NEBU
INHALATION_SOLUTION | RESPIRATORY_TRACT | 3 refills | Status: DC
  Filled 2022-07-11: qty 90, 7d supply, fill #0
  Filled 2022-08-01: qty 90, 7d supply, fill #1
  Filled 2022-11-25: qty 90, 7d supply, fill #2
  Filled 2022-12-08: qty 90, 7d supply, fill #3

## 2022-07-11 NOTE — Telephone Encounter (Signed)
Called and spoke with patient who is requesting refill of her rescue inhaler and albuterol solution. Refills have been sent to preferred pharmacy. Nothing further needed at this time.

## 2022-07-15 NOTE — Telephone Encounter (Signed)
Patient states she picked up Epipen from pharmacy. She is on worker's comp right now due to knee injury but is willing to come in to complete Xolair dose. She is aware of 2 hour monitoring period and she is aware that she will be required to have Epipen on hand and in date.  Scheduled Xolair re-start appt for Thursday, 07/17/22.  Chesley Mires, PharmD, MPH, BCPS, CPP Clinical Pharmacist (Rheumatology and Pulmonology)

## 2022-07-16 NOTE — Progress Notes (Deleted)
HPI Patient presents today to [facility] to see pharmacy team for Xolair new start.  Past medical history includes *** Her last Xolair treatment was almost 2 years ago per her recollection  She is a CMA and fees comfortalbe with injections.  Epipen on hand and in date: {yes/no:20286}  Number of hospitalizations in past year: Number of ***COPD/asthma exacerbations in past year:   Respiratory Medications Current regimen: Advair Diskus 500/50 mcg (1 puff twice daily), montelukast 10mg  daily  Patient reports {Adherence challenges yes no:3044014::"adherence challenges","no known adherence challenges"}  OBJECTIVE Allergies  Allergen Reactions   Actical Diarrhea   Celery Oil Diarrhea   Corn Oil Diarrhea    rash   Iron Sucrose     Other reaction(s): Dizziness, Other (See Comments), Other - See Comments Pt became flushed, hot and diaphoretic after completion of infusion.  Pt became flushed, hot and diaphoretic after completion of infusion.     Milk (Cow) Diarrhea   Nutricap [Actical] Diarrhea   Onion Diarrhea   Other Nausea And Vomiting    Green bell peppers   Pedi-Pre Tape Spray [Wound Dressing Adhesive] Hives   Vanilla Diarrhea    Outpatient Encounter Medications as of 07/17/2022  Medication Sig   acetaminophen (TYLENOL) 500 MG tablet Take 1,000 mg by mouth every 6 (six) hours as needed for moderate pain.   albuterol (PROAIR HFA) 108 (90 Base) MCG/ACT inhaler Inhale 2 puffs every 6 (six) hours if needed for wheezing.   albuterol (PROVENTIL) (2.5 MG/3ML) 0.083% nebulizer solution Take 3 mLs by nebulization every 6 (six) hours as needed for shortness of breath or wheezing.   albuterol (PROVENTIL) (2.5 MG/3ML) 0.083% nebulizer solution Take 3 mL (2.5 mg total) by nebulization every 6 (six) hours if needed for wheezing.   albuterol (VENTOLIN HFA) 108 (90 Base) MCG/ACT inhaler Inhale 2 puffs into the lungs every 6 (six) hours as needed for wheezing.   benzonatate (TESSALON)  200 MG capsule Take 1 capsule (200 mg total) by mouth 3 (three) times a day if needed for cough for up to 7 days. Do not crush or chew.   EPINEPHrine 0.3 mg/0.3 mL IJ SOAJ injection Inject 0.3 mg into the muscle as needed for anaphylaxis.   fluticasone-salmeterol (ADVAIR DISKUS) 500-50 MCG/ACT AEPB Inhale 1 puff into the lungs in the morning and at bedtime.   meloxicam (MOBIC) 15 MG tablet Take 1 tablet by mouth once a day   montelukast (SINGULAIR) 10 MG tablet Take 1 tablet (10 mg total) by mouth at bedtime.   PARoxetine (PAXIL-CR) 12.5 MG 24 hr tablet Take 12.5 mg by mouth every morning.   predniSONE (DELTASONE) 20 MG tablet Take 3 tablets (60 mg total) by mouth 1 (one) time each day for 2 days, THEN 2 tablets (40 mg total) 1 (one) time each day for 2 days, THEN 1 tablet (20 mg total) 1 (one) time each day for 2 days.   promethazine-dextromethorphan (PROMETHAZINE-DM) 6.25-15 MG/5ML syrup Take 5 mL by mouth every 8 (eight) hours if needed for cough.   No facility-administered encounter medications on file as of 07/17/2022.     Immunization History  Administered Date(s) Administered   Influenza Split 09/25/2011     PFTs     No data to display           Eosinophils Most recent blood eosinophil count was *** cells/microL taken on ***.   IgE: *** on *** Pre-treatment weight:    Assessment   Biologics training for omalizumab (Xolair)  Goals of therapy: Mechanism: IgG monoclonal antibody (recombinant DNA derived) which inhibits IgE binding to the high-affinity IgE receptor on mast cells and basophils.  Reviewed that Xolair is add-on medication and patient must continue maintenance inhaler regimen. Response to therapy: may take 3 to 6 months to determine efficacy. Discussed that patients generally feel improvement sooner than 3 months.  Side effects: anaphylaxis (0.1%) (black boxed warning), injection site reaction (45%), arthralgia (2.9% to 8%), headache (3% to 15%)  Dose: ***  every *** based on pre-treatment IgE of *** and weight of ***kg. Patient advised that each dose will be *** purple and *** blue pre-filled syringes.  Administration/Storage:  Reviewed administration sites of thigh or abdomen (at least 2-3 inches away from abdomen). Reviewed the upper arm is only appropriate if caregiver is administering injection. Patient advised of injection site reaction management. Discussed that injections may take 5 to 10 seconds to administer (solution is slightly viscous). Do not inject into moles, scars, bruises, tender areas, or broken skin. Do not shake pre-filled syringe as this could lead to product foaming or precipitation. Do not use if solution is discolored or contains particulate matter or if window on prefilled pen is yellow (indicates pen has been used).  Reviewed storage of medication in refrigerator. Reviewed that Xolair can be stored at room temperature in unopened carton for up to 4 hours only.  Access: Approval of Xolair through: {specialtycoverage:25706} Patient enrolled into copay card program to help with copay assistance.  Patient self-administered Xolair 150mg /mL x 2 syringes in *** and *** using sample Xolair 150mg /mL pre-filled syringe x 2 syringes NDC: *** Lot: *** Expiration: ***  Patient monitored for 2 hours minutes for adverse reaction.  Patient tolerated ***. Injection site checked and no ***.  Medication Reconciliation  A drug regimen assessment was performed, including review of allergies, interactions, disease-state management, dosing and immunization history. Medications were reviewed with the patient, including name, instructions, indication, goals of therapy, potential side effects, importance of adherence, and safe use.  Drug interaction(s): ***  Immunizations  Patient is indicated for the influenzae, pneumonia, and shingles vaccinations. Patient has received *** COVID19 vaccines.  PLAN Second Xolair dose will be  administered in clinic with 30 minute monitoring period. Scheduled for ***. Patient advised that Epipen must be on hand at each visit. Third Xolair will be administered in clinic with 30 minute monitoring period - appointment will be scheduled at second-dose visit. Rx sent to: [facility]: [phone] .  Patient provided with pharmacy phone number and advised to call later this week to schedule shipment to home. Patient provided with copay card information to provide to pharmacy if quoted copay exceeds $5 per month. Continue maintenance asthma regimen of: Advair Diskus 500/50 mcg (1 puff twice daily), montelukast 10mg  daily  All questions encouraged and answered.  Instructed patient to reach out with any further questions or concerns.  Thank you for allowing pharmacy to participate in this patient's care.  This appointment required 150 minutes of patient care (this includes precharting, chart review, review of results, face-to-face care, etc.).   , PharmD, MPH, BCPS, CPP Clinical Pharmacist (Rheumatology and Pulmonology)

## 2022-07-17 ENCOUNTER — Other Ambulatory Visit: Payer: 59 | Admitting: Pharmacist

## 2022-07-17 NOTE — Telephone Encounter (Signed)
Patient no-show to Xolair restart appt. Left VM requesting return call to r/s  Chesley Mires, PharmD, MPH, BCPS, CPP Clinical Pharmacist (Rheumatology and Pulmonology)

## 2022-07-25 ENCOUNTER — Ambulatory Visit: Payer: Medicaid Other | Admitting: Pulmonary Disease

## 2022-07-30 NOTE — Telephone Encounter (Signed)
ATC patient to schedule Xolair re-start visit. Unable to reach. Left VM requesting return call.  Chesley Mires, PharmD, MPH, BCPS, CPP Clinical Pharmacist (Rheumatology and Pulmonology)

## 2022-08-01 ENCOUNTER — Other Ambulatory Visit: Payer: Self-pay

## 2022-08-22 ENCOUNTER — Ambulatory Visit: Payer: 59 | Admitting: Pulmonary Disease

## 2022-08-25 NOTE — Telephone Encounter (Signed)
Patient has not reached back out to clinic to r/s Xolair re-start appointment. Will close encounter. Routing to Dr. Everardo All as Katheren Shams, PharmD, MPH, BCPS, CPP Clinical Pharmacist (Rheumatology and Pulmonology)

## 2022-08-26 ENCOUNTER — Encounter: Payer: Self-pay | Admitting: Pulmonary Disease

## 2022-08-29 ENCOUNTER — Other Ambulatory Visit: Payer: Self-pay

## 2022-08-29 ENCOUNTER — Encounter (HOSPITAL_COMMUNITY): Payer: Self-pay

## 2022-08-29 ENCOUNTER — Emergency Department (HOSPITAL_COMMUNITY)
Admission: EM | Admit: 2022-08-29 | Discharge: 2022-08-29 | Disposition: A | Discharge status: Home / Self Care | Payer: 59 | Attending: Emergency Medicine | Admitting: Emergency Medicine

## 2022-08-29 ENCOUNTER — Emergency Department (HOSPITAL_COMMUNITY): Payer: 59

## 2022-08-29 DIAGNOSIS — J45909 Unspecified asthma, uncomplicated: Secondary | ICD-10-CM | POA: Diagnosis not present

## 2022-08-29 DIAGNOSIS — R197 Diarrhea, unspecified: Secondary | ICD-10-CM | POA: Diagnosis not present

## 2022-08-29 DIAGNOSIS — Z7951 Long term (current) use of inhaled steroids: Secondary | ICD-10-CM | POA: Diagnosis not present

## 2022-08-29 DIAGNOSIS — J069 Acute upper respiratory infection, unspecified: Secondary | ICD-10-CM | POA: Insufficient documentation

## 2022-08-29 DIAGNOSIS — R0602 Shortness of breath: Secondary | ICD-10-CM | POA: Diagnosis present

## 2022-08-29 DIAGNOSIS — Z20822 Contact with and (suspected) exposure to covid-19: Secondary | ICD-10-CM | POA: Diagnosis not present

## 2022-08-29 HISTORY — DX: Other specified eating disorder: F50.89

## 2022-08-29 HISTORY — DX: Iron deficiency anemia, unspecified: D50.9

## 2022-08-29 LAB — BASIC METABOLIC PANEL
Anion gap: 7 (ref 5–15)
BUN: 11 mg/dL (ref 6–20)
CO2: 26 mmol/L (ref 22–32)
Calcium: 8.9 mg/dL (ref 8.9–10.3)
Chloride: 106 mmol/L (ref 98–111)
Creatinine, Ser: 0.86 mg/dL (ref 0.44–1.00)
GFR, Estimated: >60 mL/min (ref >60)
Glucose, Bld: 115 mg/dL — ABNORMAL HIGH (ref 70–99)
Potassium: 3.7 mmol/L (ref 3.5–5.1)
Sodium: 139 mmol/L (ref 135–145)

## 2022-08-29 LAB — CBC WITH DIFFERENTIAL/PLATELET
Abs Immature Granulocytes: 0.01 10*3/uL (ref 0.00–0.07)
Basophils Absolute: 0 10*3/uL (ref 0.0–0.1)
Basophils Relative: 1 %
Eosinophils Absolute: 0.2 10*3/uL (ref 0.0–0.5)
Eosinophils Relative: 3 %
HCT: 38.7 % (ref 36.0–46.0)
Hemoglobin: 12.6 g/dL (ref 12.0–15.0)
Immature Granulocytes: 0 %
Lymphocytes Relative: 25 %
Lymphs Abs: 1.9 10*3/uL (ref 0.7–4.0)
MCH: 27.2 pg (ref 26.0–34.0)
MCHC: 32.6 g/dL (ref 30.0–36.0)
MCV: 83.6 fL (ref 80.0–100.0)
Monocytes Absolute: 0.4 10*3/uL (ref 0.1–1.0)
Monocytes Relative: 5 %
Neutro Abs: 4.9 10*3/uL (ref 1.7–7.7)
Neutrophils Relative %: 66 %
Platelets: 222 10*3/uL (ref 150–400)
RBC: 4.63 MIL/uL (ref 3.87–5.11)
RDW: 13.6 % (ref 11.5–15.5)
WBC: 7.3 10*3/uL (ref 4.0–10.5)
nRBC: 0 % (ref 0.0–0.2)

## 2022-08-29 LAB — RESP PANEL BY RT-PCR (FLU A&B, COVID) ARPGX2
Influenza A by PCR: NEGATIVE
Influenza B by PCR: NEGATIVE
SARS Coronavirus 2 by RT PCR: NEGATIVE

## 2022-08-29 MED ORDER — KETOROLAC TROMETHAMINE 30 MG/ML IJ SOLN
30.0000 mg | Freq: Once | INTRAMUSCULAR | Status: AC
  Administered 2022-08-29: 30 mg via INTRAMUSCULAR
  Filled 2022-08-29: qty 1

## 2022-08-29 MED ORDER — ONDANSETRON 4 MG PO TBDP
4.0000 mg | ORAL_TABLET | Freq: Once | ORAL | Status: AC
  Administered 2022-08-29: 4 mg via ORAL
  Filled 2022-08-29: qty 1

## 2022-08-29 MED ORDER — ONDANSETRON 4 MG PO TBDP
4.0000 mg | ORAL_TABLET | Freq: Three times a day (TID) | ORAL | 0 refills | Status: DC | PRN
Start: 2022-08-29 — End: 2023-08-04

## 2022-08-29 MED ORDER — ALBUTEROL SULFATE HFA 108 (90 BASE) MCG/ACT IN AERS
2.0000 | INHALATION_SPRAY | RESPIRATORY_TRACT | Status: DC | PRN
  Administered 2022-08-29: 2 via RESPIRATORY_TRACT
  Filled 2022-08-29: qty 6.7

## 2022-08-29 MED ORDER — AEROCHAMBER Z-STAT PLUS/MEDIUM MISC
1.0000 | Freq: Once | Status: AC
Start: 2022-08-29 — End: 2022-08-29
  Administered 2022-08-29: 1
  Filled 2022-08-29: qty 1

## 2022-08-29 MED ORDER — PREDNISONE 20 MG PO TABS
60.0000 mg | ORAL_TABLET | Freq: Once | ORAL | Status: AC
  Administered 2022-08-29: 60 mg via ORAL
  Filled 2022-08-29: qty 3

## 2022-08-29 MED ORDER — PREDNISONE 50 MG PO TABS: 50.0000 mg | ORAL_TABLET | Freq: Every day | ORAL | 0 refills | Status: DC

## 2022-08-29 NOTE — ED Triage Notes (Signed)
Patient c/o SOB, asthma, cough,, body aches, fatigue, diarrhea x 2 days.  Patient states a co-worker tested positive for RSV.

## 2022-08-29 NOTE — ED Provider Notes (Signed)
[facility]-EMERGENCY DEPT Provider Note   CSN: [id number] Arrival date & time: 08/29/22  1716     History  Chief Complaint  Patient presents with   Shortness of Breath   Asthma   Generalized Body Aches   Diarrhea   Headache   Fatigue   Cough    Holly Nielsen is a 31 y.o. female.  Pt is a 31 yo female with a pmhx significant for asthma, anemia, and guillain-barre syn in the past.  Pt has had uri sx for 2 days.  She has had wheezing, some nausea and diarrhea.  Pt said her boss was diagnosed with RSV today.  She denies any fever.  She is out of her inhaler.       Home Medications Prior to Admission medications   Medication Sig Start Date End Date Taking? Authorizing Provider  ondansetron (ZOFRAN-ODT) 4 MG disintegrating tablet Take 1 tablet (4 mg total) by mouth every 8 (eight) hours as needed for nausea or vomiting. 08/29/22  Yes Jacalyn Lefevre, MD  predniSONE (DELTASONE) 50 MG tablet Take 1 tablet (50 mg total) by mouth daily with breakfast. 08/29/22  Yes Jacalyn Lefevre, MD  acetaminophen (TYLENOL) 500 MG tablet Take 1,000 mg by mouth every 6 (six) hours as needed for moderate pain.    [provider]  albuterol (PROAIR HFA) 108 (90 Base) MCG/ACT inhaler Inhale 2 puffs every 6 (six) hours if needed for wheezing. 07/11/22   Luciano Cutter, MD  albuterol (PROVENTIL) (2.5 MG/3ML) 0.083% nebulizer solution Take 3 mLs by nebulization every 6 (six) hours as needed for shortness of breath or wheezing. 04/25/22   Luciano Cutter, MD  albuterol (PROVENTIL) (2.5 MG/3ML) 0.083% nebulizer solution Take 3 mL (2.5 mg total) by nebulization every 6 (six) hours if needed for wheezing. 07/11/22   Luciano Cutter, MD  albuterol (VENTOLIN HFA) 108 (90 Base) MCG/ACT inhaler Inhale 2 puffs into the lungs every 6 (six) hours as needed for wheezing. 04/25/22   Luciano Cutter, MD  benzonatate (TESSALON) 200 MG capsule Take 1 capsule (200 mg total) by mouth 3 (three)  times a day if needed for cough for up to 7 days. Do not crush or chew. 06/17/22     EPINEPHrine 0.3 mg/0.3 mL IJ SOAJ injection Inject 0.3 mg into the muscle as needed for anaphylaxis. 06/24/22   Luciano Cutter, MD  fluticasone-salmeterol (ADVAIR DISKUS) 500-50 MCG/ACT AEPB Inhale 1 puff into the lungs in the morning and at bedtime. 04/25/22   Luciano Cutter, MD  meloxicam (MOBIC) 15 MG tablet Take 1 tablet by mouth once a day 07/08/22     montelukast (SINGULAIR) 10 MG tablet Take 1 tablet (10 mg total) by mouth at bedtime. 04/25/22   Luciano Cutter, MD  PARoxetine (PAXIL-CR) 12.5 MG 24 hr tablet Take 12.5 mg by mouth every morning. 07/08/21   [provider]  promethazine-dextromethorphan (PROMETHAZINE-DM) 6.25-15 MG/5ML syrup Take 5 mL by mouth every 8 (eight) hours if needed for cough. 06/17/22         Allergies    Actical, Celery oil, Corn oil, Iron sucrose, Milk (cow), Nutricap [actical], Onion, Other, Pedi-pre tape spray [wound dressing adhesive], and Vanilla    Review of Systems   Review of Systems  Respiratory:  Positive for cough and wheezing.   Gastrointestinal:  Positive for diarrhea and nausea.  All other systems reviewed and are negative.   Physical Exam Updated Vital Signs BP (!) 128/90  Pulse 79   Temp 97.7 F (36.5 C) (Oral)   Resp 16   Ht 5' (1.524 m)   Wt 117.9 kg   LMP 08/16/2022 (Approximate)   SpO2 96%   BMI 50.78 kg/m  Physical Exam Vitals and nursing note reviewed.  Constitutional:      Appearance: She is well-developed. She is obese.  HENT:     Head: Normocephalic and atraumatic.     Mouth/Throat:     Mouth: Mucous membranes are moist.     Pharynx: Oropharynx is clear.  Eyes:     Extraocular Movements: Extraocular movements intact.     Pupils: Pupils are equal, round, and reactive to light.  Cardiovascular:     Rate and Rhythm: Normal rate and regular rhythm.  Pulmonary:     Effort: Pulmonary effort is normal.     Breath sounds:  Normal breath sounds.  Abdominal:     General: Bowel sounds are normal.     Palpations: Abdomen is soft.  Musculoskeletal:        General: Normal range of motion.     Cervical back: Normal range of motion and neck supple.  Skin:    General: Skin is warm.     Capillary Refill: Capillary refill takes less than 2 seconds.  Neurological:     General: No focal deficit present.     Mental Status: She is alert and oriented to person, place, and time.  Psychiatric:        Mood and Affect: Mood normal.        Behavior: Behavior normal.     ED Results / Procedures / Treatments   Labs (all labs ordered are listed, but only abnormal results are displayed) Labs Reviewed  BASIC METABOLIC PANEL - Abnormal; Notable for the following components:      Result Value   Glucose, Bld 115 (*)    All other components within normal limits  RESP PANEL BY RT-PCR (FLU A&B, COVID) ARPGX2  CBC WITH DIFFERENTIAL/PLATELET    EKG None  Radiology DG Chest 2 View  Result Date: 08/29/2022 CLINICAL DATA:  Dyspnea. Asthma. Cough, body aches, fatigue and diarrhea for 2 days. EXAM: CHEST - 2 VIEW COMPARISON:  04/12/2022 FINDINGS: The heart size and mediastinal contours are within normal limits. No pleural effusion or edema. Linear opacity within the left lower lung compatible with atelectasis versus scar. The visualized skeletal structures are unremarkable. IMPRESSION: Left lower lung discoid atelectasis versus scar. Electronically Signed   By: Signa Kell M.D.   On: 08/29/2022 18:18    Procedures Procedures    Medications Ordered in ED Medications  albuterol (VENTOLIN HFA) 108 (90 Base) MCG/ACT inhaler 2 puff (2 puffs Inhalation Given 08/29/22 2128)  aerochamber Z-Stat Plus/medium 1 each (1 each Other Given 08/29/22 2140)  ketorolac (TORADOL) 30 MG/ML injection 30 mg (30 mg Intramuscular Given 08/29/22 2129)  predniSONE (DELTASONE) tablet 60 mg (60 mg Oral Given 08/29/22 2128)  ondansetron (ZOFRAN-ODT)  disintegrating tablet 4 mg (4 mg Oral Given 08/29/22 2128)    ED Course/ Medical Decision Making/ A&P                           Medical Decision Making Risk Prescription drug management.   This patient presents to the ED for concern of uri sx, this involves an extensive number of treatment options, and is a complaint that carries with it a high risk of complications and morbidity.  The differential diagnosis  includes covid/flu/rsv, pna   Co morbidities that complicate the patient evaluation  asthma, anemia, and guillain-barre syn   Additional history obtained:  Additional history obtained from epic chart review    Lab Tests:  I Ordered, and personally interpreted labs.  The pertinent results include:  cbc and bmp nl, covid neg   Imaging Studies ordered:  I ordered imaging studies including cxr  I independently visualized and interpreted imaging which showed  IMPRESSION:  Left lower lung discoid atelectasis versus scar.   I agree with the radiologist interpretation   Cardiac Monitoring:  The patient was maintained on a cardiac monitor.  I personally viewed and interpreted the cardiac monitored which showed an underlying rhythm of: nsr   Medicines ordered and prescription drug management:  I ordered medication including toradol, prednisone, zofran, and albuterol  for sx  Reevaluation of the patient after these medicines showed that the patient improved I have reviewed the patients home medicines and have made adjustments as needed   Critical Interventions:  Albuterol inhaler    Problem List / ED Course:  URI:  likely RSV.  Pt is not hypoxic and looks well.  She is stable for d/c.  She is to return if worse.  F/u with pulm as needed.   Reevaluation:  After the interventions noted above, I reevaluated the patient and found that they have :improved   Social Determinants of Health:  Lives at home   Dispostion:  After consideration of the diagnostic  results and the patients response to treatment, I feel that the patent would benefit from discharge with outpatient f/u.          Final Clinical Impression(s) / ED Diagnoses Final diagnoses:  Viral upper respiratory tract infection    Rx / DC Orders ED Discharge Orders          Ordered    predniSONE (DELTASONE) 50 MG tablet  Daily with breakfast        08/29/22 2151    ondansetron (ZOFRAN-ODT) 4 MG disintegrating tablet  Every 8 hours PRN        08/29/22 2151              Jacalyn Lefevre, MD 08/29/22 2154

## 2022-08-29 NOTE — ED Provider Triage Note (Signed)
Emergency Medicine Provider Triage Evaluation Note  Holly Nielsen , a 31 y.o. female  was evaluated in triage.  Pt complains of URI symptoms, shortness of breath, wheezing, significant episodes of diarrhea over the past 2 days.  States she has been in close proximity to someone who has had RSV.  Denies adequate hydration.  Review of Systems  Positive: As above Negative: As above  Physical Exam  BP (!) 159/105 (BP Location: Right Arm)   Pulse 81   Temp 98.8 F (37.1 C) (Oral)   Resp 20   Ht 5' (1.524 m)   Wt 117.9 kg   LMP 08/16/2022   SpO2 95%   BMI 50.78 kg/m  Gen:   Awake, no distress   Resp:  Normal effort MSK:   Moves extremities without difficulty  Other:    Medical Decision Making  Medically screening exam initiated at 5:42 PM.  Appropriate orders placed.  Holly Nielsen was informed that the remainder of the evaluation will be completed by another provider, this initial triage assessment does not replace that evaluation, and the importance of remaining in the ED until their evaluation is complete.     Marita Kansas, PA-C 08/29/22 1743

## 2022-09-02 ENCOUNTER — Other Ambulatory Visit: Payer: Self-pay

## 2022-09-17 IMAGING — CR DG CHEST 2V
2 series · 2 of 2 positions shown · non-contrast
Comparison: Chest radiograph 11/11/2021

CLINICAL DATA: Cough, body aches, fever, chills

EXAM:
CHEST - 2 VIEW

[w chest pa]
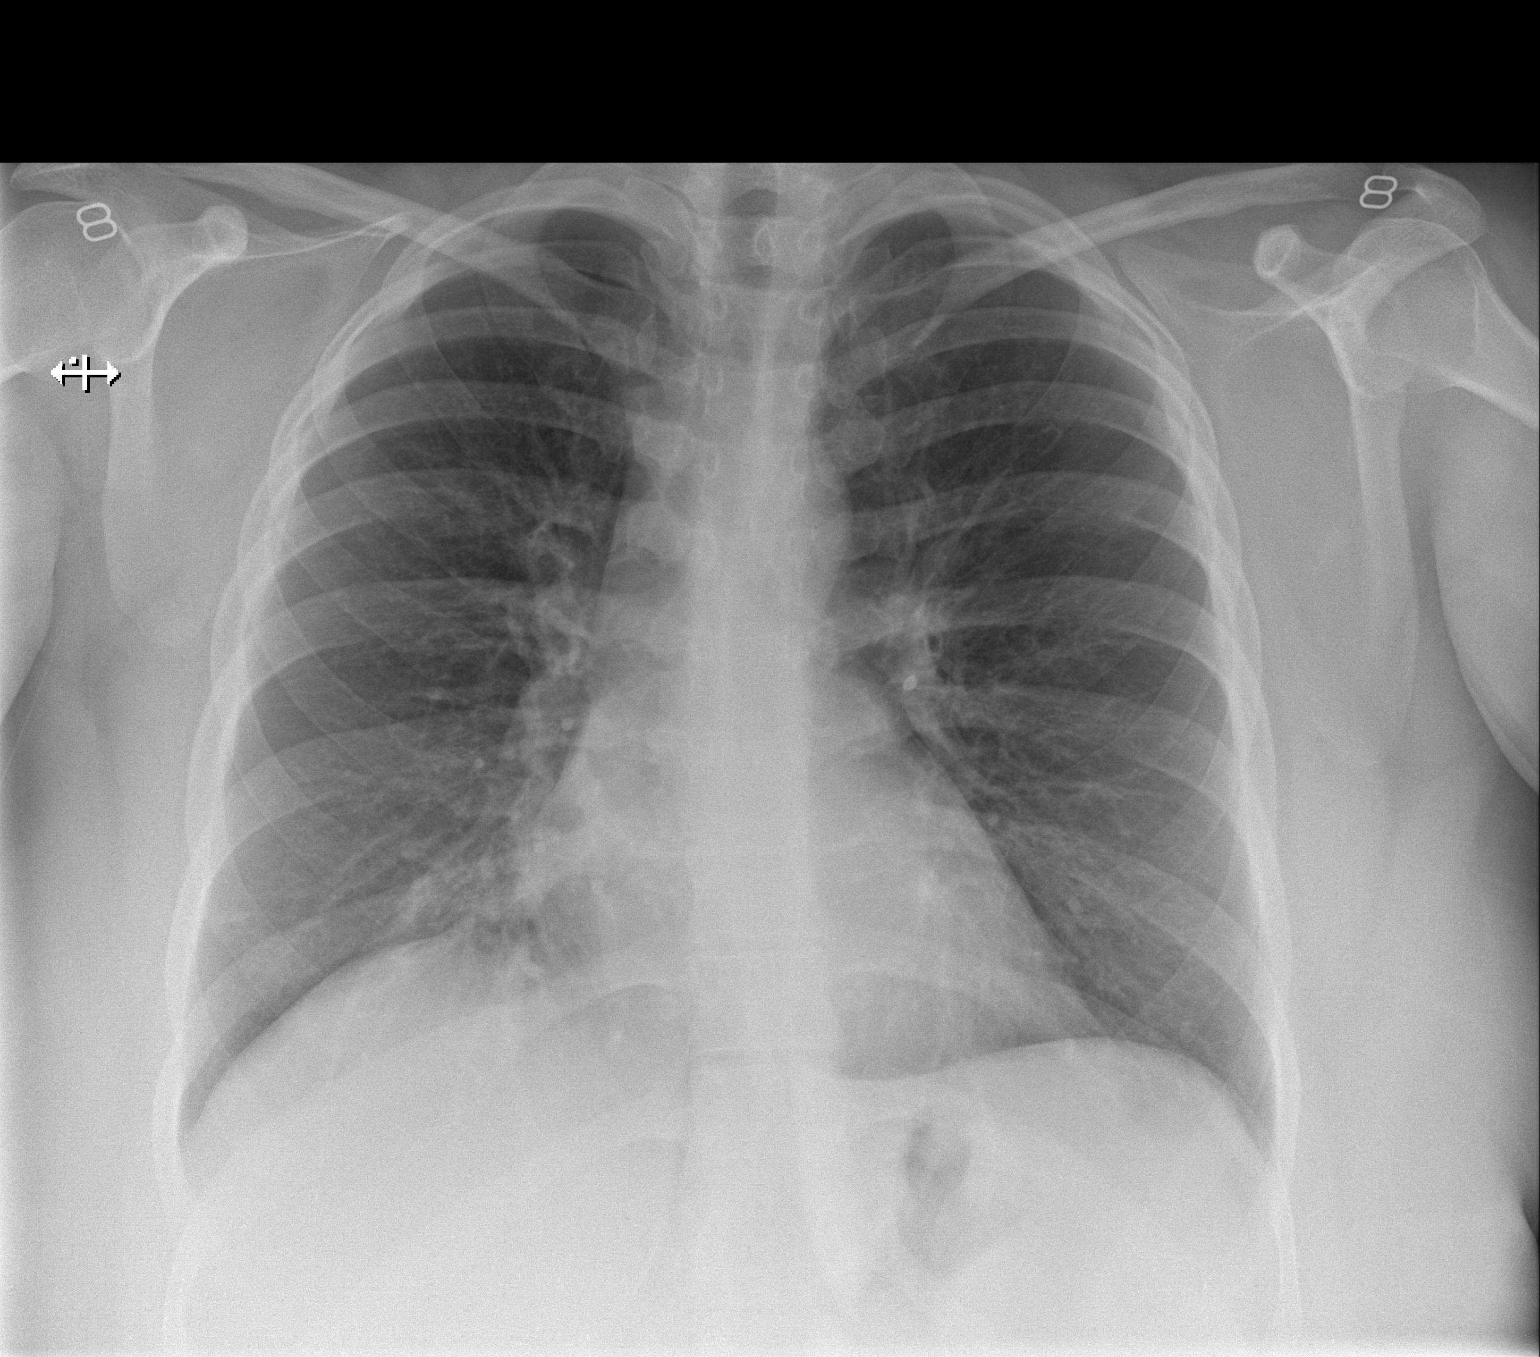

[w chest lat]
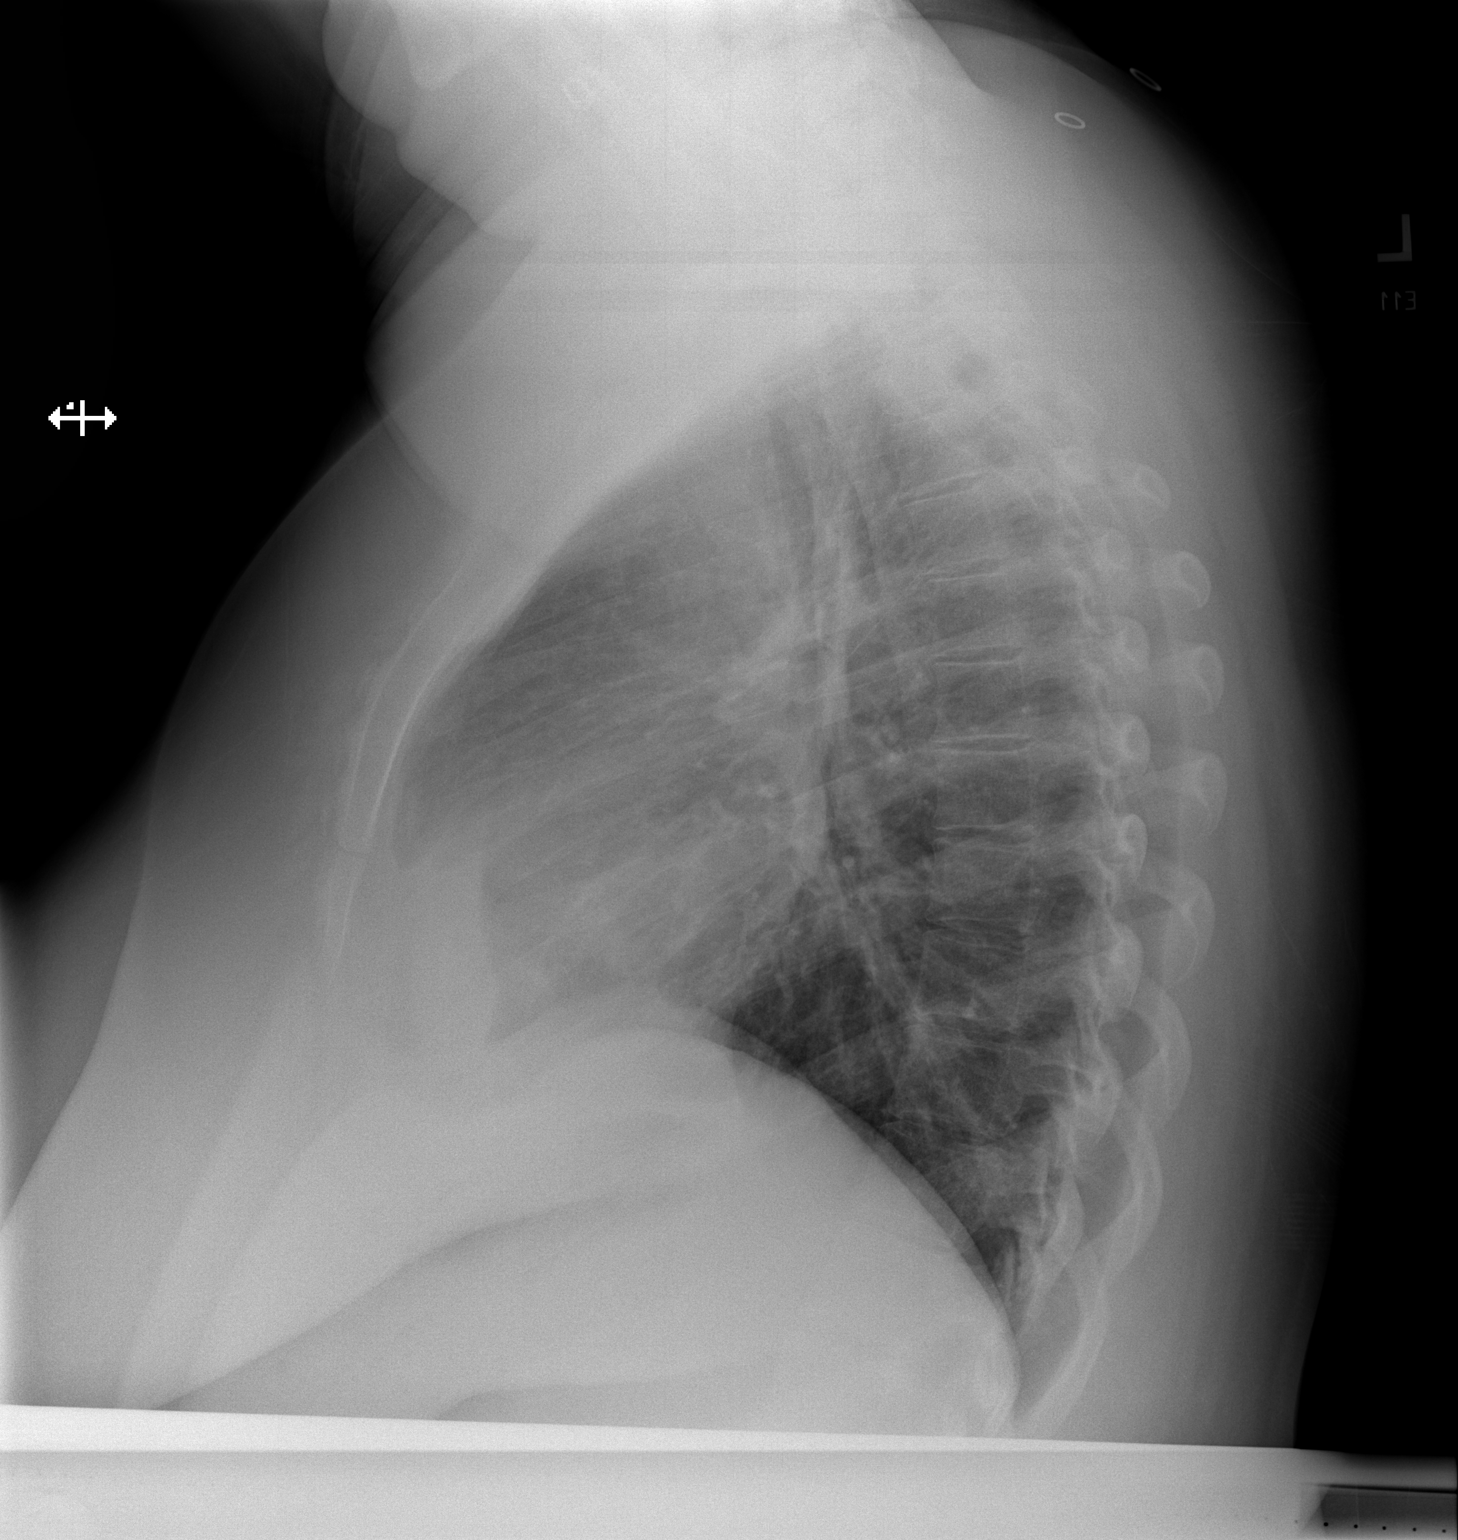

[2 of 2 positions shown; findings below may reference images not displayed]

FINDINGS: The cardiomediastinal silhouette is stable.

There is no focal consolidation or pulmonary edema. There is no
pleural effusion or pneumothorax

There is no acute osseous abnormality.
IMPRESSION: No radiographic evidence of acute cardiopulmonary process.

## 2022-09-19 ENCOUNTER — Other Ambulatory Visit: Payer: Self-pay

## 2022-09-19 MED ORDER — ALBUTEROL SULFATE HFA 108 (90 BASE) MCG/ACT IN AERS
2.0000 | INHALATION_SPRAY | Freq: Four times a day (QID) | RESPIRATORY_TRACT | 1 refills | Status: DC | PRN
  Filled 2022-09-19: qty 6.7, 25d supply, fill #0
  Filled 2022-10-02 – 2022-10-27 (×4): qty 6.7, 25d supply, fill #1

## 2022-09-22 ENCOUNTER — Ambulatory Visit (INDEPENDENT_AMBULATORY_CARE_PROVIDER_SITE_OTHER): Payer: 59 | Admitting: Pulmonary Disease

## 2022-09-22 ENCOUNTER — Telehealth: Payer: Self-pay | Admitting: Pharmacist

## 2022-09-22 ENCOUNTER — Other Ambulatory Visit (HOSPITAL_COMMUNITY): Payer: Self-pay

## 2022-09-22 ENCOUNTER — Encounter: Payer: Self-pay | Admitting: Pulmonary Disease

## 2022-09-22 VITALS — BP 110/84 | HR 91 | Temp 97.6°F | Ht 60.0 in | Wt 278.0 lb

## 2022-09-22 DIAGNOSIS — J4550 Severe persistent asthma, uncomplicated: Secondary | ICD-10-CM

## 2022-09-22 MED ORDER — FLUTICASONE-SALMETEROL 500-50 MCG/ACT IN AEPB
1.0000 | INHALATION_SPRAY | Freq: Two times a day (BID) | RESPIRATORY_TRACT | 11 refills | Status: DC
  Filled 2022-09-22 – 2022-10-27 (×2): qty 60, 30d supply, fill #0

## 2022-09-22 NOTE — Addendum Note (Signed)
Addended by: Darliss Ridgel on: 09/22/2022 03:07 PM   Modules accepted: Orders

## 2022-09-22 NOTE — Progress Notes (Signed)
Subjective:   PATIENT ID: Holly Nielsen GENDER: female DOB: 12-26-1991, MRN: [id number]   HPI  Chief Complaint  Patient presents with   Follow-up    Asthma    Reason for Visit: Follow-up asthma  Ms. Holly Nielsen is a 31 year old female with childhood/adult asthma, morbid obesity, history of Guillain-Barr syndrome in 2017, ADD, anxiety/depression who presents for asthma.  Since September 2022 she has had 5 ED visits for viral illnesses/respiratory complaints including COVID 19 in January 2023.  She was briefly hospitalized from 3/16 to 03/14/2022 for asthma exacerbation requiring nebulizers IV steroids.  She was discharged on prednisone and Advair pulmonary referral placed.  She was seen again in the ED on 04/12/2022 after running out of nebulizer solution and Advair and having symptoms of shortness of breath and wheezing  She has moved from [facility] to [facility]. Previously on Xolair, last taken in 2021. At baseline she has shortness of breath, cough and wheezing. Symptoms triggered by allergens and illness. Able to walk and push through her symptoms. 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 Uses albuterol several times a day. She walks her dog daily 2 miles.  09/22/22 Since our last visit she has been seen in urgent care for asthma flare in June and had RSV virus earlier this month in September and treated with prednisone taper. She reports she has not been able to make Xolair appointments due to death in family and had to caregiver to grandmother. She is ready to focus on her health now. She is compliant with her Advair. Has some cough and wheezing requiring albuterol twice a day.  Asthma Control Test ACT Total Score  09/22/2022 12:53 PM 10  04/25/2022 10:02 AM 12      Social History: Never smoker  Past Medical History:  Diagnosis Date   Asthma    Guillain-Barre (HCC)    Iron deficiency anemia    Pica      Family History  Problem Relation Age of Onset   Asthma Father    Diabetes  Mellitus I Paternal Grandmother    Stroke Paternal Grandfather      Social History   Occupational History   Not on file  Tobacco Use   Smoking status: Never   Smokeless tobacco: Never  Vaping Use   Vaping Use: Never used  Substance and Sexual Activity   Alcohol use: Never   Drug use: Never   Sexual activity: Yes    Allergies  Allergen Reactions   Actical Diarrhea   Celery Oil Diarrhea   Corn Oil Diarrhea    rash   Iron Sucrose     Other reaction(s): Dizziness, Other (See Comments), Other - See Comments Pt became flushed, hot and diaphoretic after completion of infusion.  Pt became flushed, hot and diaphoretic after completion of infusion.     Milk (Cow) Diarrhea   Nutricap [Actical] Diarrhea   Onion Diarrhea   Other Nausea And Vomiting    Green bell peppers   Pedi-Pre Tape Spray [Wound Dressing Adhesive] Hives   Vanilla Diarrhea     Outpatient Medications Prior to Visit  Medication Sig Dispense Refill   acetaminophen (TYLENOL) 500 MG tablet Take 1,000 mg by mouth every 6 (six) hours as needed for moderate pain.     albuterol (PROVENTIL) (2.5 MG/3ML) 0.083% nebulizer solution Take 3 mLs by nebulization every 6 (six) hours as needed for shortness of breath or wheezing. 360 mL 5   albuterol (PROVENTIL) (2.5 MG/3ML) 0.083% nebulizer solution Take 3  mL (2.5 mg total) by nebulization every 6 (six) hours if needed for wheezing. 90 mL 3   albuterol (VENTOLIN HFA) 108 (90 Base) MCG/ACT inhaler Inhale 2 puffs into the lungs every 6 (six) hours as needed for wheezing. 6.7 g 0   albuterol (VENTOLIN HFA) 108 (90 Base) MCG/ACT inhaler inhale 2 puffs by mouth every 6 (six) hours as needed for wheezing 6.7 g 1   EPINEPHrine 0.3 mg/0.3 mL IJ SOAJ injection Inject 0.3 mg into the muscle as needed for anaphylaxis. 2 each 5   montelukast (SINGULAIR) 10 MG tablet Take 1 tablet (10 mg total) by mouth at bedtime. 30 tablet 11   ondansetron (ZOFRAN-ODT) 4 MG disintegrating tablet Take 1  tablet (4 mg total) by mouth every 8 (eight) hours as needed for nausea or vomiting. 20 tablet 0   PARoxetine (PAXIL-CR) 12.5 MG 24 hr tablet Take 12.5 mg by mouth every morning.     fluticasone-salmeterol (ADVAIR DISKUS) 500-50 MCG/ACT AEPB Inhale 1 puff into the lungs in the morning and at bedtime. 60 each 11   benzonatate (TESSALON) 200 MG capsule Take 1 capsule (200 mg total) by mouth 3 (three) times a day if needed for cough for up to 7 days. Do not crush or chew. (Patient not taking: Reported on 09/22/2022) 20 capsule 0   meloxicam (MOBIC) 15 MG tablet Take 1 tablet by mouth once a day (Patient not taking: Reported on 09/22/2022) 30 tablet 0   predniSONE (DELTASONE) 50 MG tablet Take 1 tablet (50 mg total) by mouth daily with breakfast. (Patient not taking: Reported on 09/22/2022) 5 tablet 0   promethazine-dextromethorphan (PROMETHAZINE-DM) 6.25-15 MG/5ML syrup Take 5 mL by mouth every 8 (eight) hours if needed for cough. (Patient not taking: Reported on 09/22/2022) 118 mL 0   No facility-administered medications prior to visit.    Review of Systems  Constitutional:  Negative for chills, diaphoresis, fever, malaise/fatigue and weight loss.  HENT:  Negative for congestion.   Respiratory:  Positive for cough, shortness of breath and wheezing. Negative for hemoptysis and sputum production.   Cardiovascular:  Negative for chest pain, palpitations and leg swelling.     Objective:   Vitals:   09/22/22 1156  BP: 110/84  Pulse: 91  Temp: 97.6 F (36.4 C)  TempSrc: Oral  SpO2: 96%  Weight: 278 lb (126.1 kg)  Height: 5' (1.524 m)   SpO2: 96 % O2 Device: None (Room air)  Physical Exam: General: Well-appearing, no acute distress HENT: [place], AT Eyes: EOMI, no scleral icterus Respiratory: Clear to auscultation bilaterally.  No crackles, wheezing or rales Cardiovascular: RRR, -M/R/G, no JVD Extremities:-Edema,-tenderness Neuro: AAO x4, CNII-XII grossly intact Psych: Normal mood, normal  affect  Data Reviewed:  Imaging: CXR 04/12/2022-no acute infiltrate  PFT: None on file  Labs: CBC    Component Value Date/Time   WBC 7.3 08/29/2022 1820   RBC 4.63 08/29/2022 1820   HGB 12.6 08/29/2022 1820   HCT 38.7 08/29/2022 1820   PLT 222 08/29/2022 1820   MCV 83.6 08/29/2022 1820   MCH 27.2 08/29/2022 1820   MCHC 32.6 08/29/2022 1820   RDW 13.6 08/29/2022 1820   LYMPHSABS 1.9 08/29/2022 1820   MONOABS 0.4 08/29/2022 1820   EOSABS 0.2 08/29/2022 1820   BASOSABS 0.0 08/29/2022 1820   Absolute eos 03/13/22 - 400     Assessment & Plan:   Discussion: 31 year old female with childhood/adult asthma, morbid obesity, hx Guillain Barre syndrome in 2017, ADD, anxiety/depression who  presents for asthma follow-up. Not in exacerbation however symptoms remain uncontrolled including two asthma exacerbations in the last five months. Previously well controlled on Xolair. Attempted to enroll in the spring however had social limitations but now in a better situation and ready to restart. Discussed clinical course and management of asthma including bronchodilator regimen and action plan for exacerbation. Counseled on adherence and if she were to miss appointment in future this could potentially prevent her from receiving biologics due to non-adherence. Requested to maintain open communication to best accommodate her.  Severe persistent asthma with exacerbation --RE-ENROLL in Xolair. Will contact pharmacy team via message --CONTINUE Advair 500-50 mcg ONE puff TWICE a day. 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  Meds ordered this encounter  Medications   fluticasone-salmeterol (ADVAIR DISKUS) 500-50 MCG/ACT AEPB    Sig: Inhale 1 puff into the lungs in the morning and at bedtime.    Dispense:  60 each    Refill:  11    Return in about 4 months (around 01/22/2023).  I have spent a total time of 60-minutes on the day of the appointment reviewing prior documentation, coordinating care and discussing medical diagnosis and plan with the patient/family. Imaging, labs and tests included in this note have been reviewed and interpreted independently by me.  [place], MD [facility] 09/22/2022 12:56 PM  Office Number [phone]

## 2022-09-22 NOTE — Progress Notes (Signed)
ATC patient to schedule Xolair new start. Benefits had to be rechecked before scheduling. Patient's benefits remains same.  Left VM with my direct office number to schedule appt. First appt will require 2 hours of monitoring with Epipen.  Knox Saliva, PharmD, MPH, BCPS, CPP Clinical Pharmacist (Rheumatology and Pulmonology)

## 2022-09-22 NOTE — Patient Instructions (Signed)
Severe persistent asthma with exacerbation --RE-ENROLL in Xolair. Will contact pharmacy --CONTINUE Advair 500-50 mcg ONE puff TWICE a day --CONTINUE singulair 10 mg daily --CONTINUE albuterol inhaler and nebulizer --ORDER nebulizer supplies only  Osteoporosis prevention secondary to chronic steroid use --Recommend Calcium 1000-1200 mg daily and vitamin D 600-800 units daily through diet or supplements  Follow-up with me in 4 months

## 2022-09-22 NOTE — Telephone Encounter (Signed)
Received notification from Dr. Loanne Drilling. Patient would like to restart Xolair. ATC patient. Left VM requesting return call with my direct number.  Knox Saliva, PharmD, MPH, BCPS, CPP Clinical Pharmacist (Rheumatology and Pulmonology)

## 2022-09-24 NOTE — Telephone Encounter (Signed)
ATC patient to schedule Xolair restart appt. Unable to reach. Left VM requesting return call  .Knox Saliva, PharmD, MPH, BCPS, CPP Clinical Pharmacist (Rheumatology and Pulmonology)

## 2022-09-29 ENCOUNTER — Other Ambulatory Visit: Payer: Self-pay

## 2022-09-29 NOTE — Telephone Encounter (Signed)
ATC to call patient to schedule Xolair. This has been third attempt to schedule. Will await return call from patient until end of week prior to closing encounter  Knox Saliva, PharmD, MPH, BCPS, CPP Clinical Pharmacist (Rheumatology and Pulmonology)

## 2022-10-02 ENCOUNTER — Other Ambulatory Visit: Payer: Self-pay

## 2022-10-03 ENCOUNTER — Other Ambulatory Visit (HOSPITAL_COMMUNITY): Payer: Self-pay

## 2022-10-08 ENCOUNTER — Other Ambulatory Visit: Payer: Self-pay

## 2022-10-14 ENCOUNTER — Other Ambulatory Visit: Payer: Self-pay

## 2022-10-21 ENCOUNTER — Emergency Department (HOSPITAL_COMMUNITY)
Admission: EM | Admit: 2022-10-21 | Discharge: 2022-10-21 | Discharge status: Against Medical Advice | Payer: 59 | Attending: Physician Assistant | Admitting: Physician Assistant

## 2022-10-21 ENCOUNTER — Encounter (HOSPITAL_COMMUNITY): Payer: Self-pay

## 2022-10-21 ENCOUNTER — Other Ambulatory Visit: Payer: Self-pay

## 2022-10-21 ENCOUNTER — Emergency Department (HOSPITAL_COMMUNITY): Payer: 59

## 2022-10-21 DIAGNOSIS — R0981 Nasal congestion: Secondary | ICD-10-CM | POA: Diagnosis not present

## 2022-10-21 DIAGNOSIS — M7918 Myalgia, other site: Secondary | ICD-10-CM | POA: Insufficient documentation

## 2022-10-21 DIAGNOSIS — H5789 Other specified disorders of eye and adnexa: Secondary | ICD-10-CM | POA: Insufficient documentation

## 2022-10-21 DIAGNOSIS — J45909 Unspecified asthma, uncomplicated: Secondary | ICD-10-CM | POA: Diagnosis not present

## 2022-10-21 DIAGNOSIS — R519 Headache, unspecified: Secondary | ICD-10-CM | POA: Insufficient documentation

## 2022-10-21 DIAGNOSIS — Z5321 Procedure and treatment not carried out due to patient leaving prior to being seen by health care provider: Secondary | ICD-10-CM | POA: Insufficient documentation

## 2022-10-21 DIAGNOSIS — Z20822 Contact with and (suspected) exposure to covid-19: Secondary | ICD-10-CM | POA: Diagnosis not present

## 2022-10-21 DIAGNOSIS — R059 Cough, unspecified: Secondary | ICD-10-CM | POA: Diagnosis not present

## 2022-10-21 DIAGNOSIS — R509 Fever, unspecified: Secondary | ICD-10-CM | POA: Diagnosis present

## 2022-10-21 LAB — RESP PANEL BY RT-PCR (FLU A&B, COVID) ARPGX2
Influenza A by PCR: NEGATIVE
Influenza B by PCR: NEGATIVE
SARS Coronavirus 2 by RT PCR: NEGATIVE

## 2022-10-21 MED ORDER — ALBUTEROL SULFATE HFA 108 (90 BASE) MCG/ACT IN AERS
2.0000 | INHALATION_SPRAY | RESPIRATORY_TRACT | Status: DC | PRN
  Administered 2022-10-21: 2 via RESPIRATORY_TRACT
  Filled 2022-10-21: qty 6.7

## 2022-10-21 NOTE — ED Triage Notes (Addendum)
Patient c/o generalized body aches, nasal congestion, a non productive cough since yesterday. Patient has a history of asthma and is having expiratory wheezing.

## 2022-10-21 NOTE — ED Provider Triage Note (Signed)
Emergency Medicine Provider Triage Evaluation Note  Holly Nielsen , a 31 y.o. female  was evaluated in triage.  Pt complains of feeling unwell.  Began yesterday.  History of asthma, has been admitted in the past.  Last steroid use approximately 1 month ago.  Temperature yesterday 100.6.  Associated headache, itchy eyes, congestion, rhinorrhea, chills.  Some shortness of breath and cough with clear sputum.  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  Review of Systems  Positive: Fever, cough, congestion, rhinorrhea, headache Negative:   Physical Exam  BP (!) 159/134   Pulse 79   Temp 98.3 F (36.8 C) (Oral)   Resp 18   SpO2 98%  Gen:   Awake, no distress   Resp:  Normal effort  MSK:   Moves extremities without difficulty  Other:    Medical Decision Making  Medically screening exam initiated at 9:46 AM.  Appropriate orders placed.  Johnny Latu was informed that the remainder of the evaluation will be completed by another provider, this initial triage assessment does not replace that evaluation, and the importance of remaining in the ED until their evaluation is complete.  Fever, cough, congestion, rhinorrhea, HA   Sinaya Minogue A, PA-C 10/21/22 [id number]

## 2022-10-21 NOTE — ED Notes (Signed)
Pt stated they are leaving and walked out of the door.

## 2022-10-22 ENCOUNTER — Ambulatory Visit (INDEPENDENT_AMBULATORY_CARE_PROVIDER_SITE_OTHER): Payer: 59 | Admitting: Nurse Practitioner

## 2022-10-22 ENCOUNTER — Other Ambulatory Visit: Payer: Self-pay

## 2022-10-22 ENCOUNTER — Encounter: Payer: Self-pay | Admitting: Nurse Practitioner

## 2022-10-22 VITALS — BP 146/80 | HR 70 | Ht 60.0 in | Wt 280.2 lb

## 2022-10-22 DIAGNOSIS — J069 Acute upper respiratory infection, unspecified: Secondary | ICD-10-CM | POA: Insufficient documentation

## 2022-10-22 DIAGNOSIS — J4550 Severe persistent asthma, uncomplicated: Secondary | ICD-10-CM | POA: Diagnosis not present

## 2022-10-22 LAB — POCT EXHALED NITRIC OXIDE: FeNO level (ppb): 19

## 2022-10-22 MED ORDER — PREDNISONE 20 MG PO TABS
40.0000 mg | ORAL_TABLET | Freq: Every day | ORAL | 0 refills | Status: AC
  Filled 2022-10-22: qty 10, 5d supply, fill #0

## 2022-10-22 NOTE — Patient Instructions (Addendum)
Continue Albuterol inhaler 2 puffs or 3 mL neb every 6 hours as needed for shortness of breath or wheezing. Notify if symptoms persist despite rescue inhaler/neb use. Use nebulizer treatment at least twice daily until symptoms improve.  Continue Advair 1 puff Twice daily. Brush tongue and rinse mouth afterwards Continue singulair 1 tab At bedtime  Continue benzonatate 1 capsule Three times a day. Brush tongue and rinse mouth afterwards  Continue flonase nasal spray 2 sprays each nostril daily Continue Xolair injections as ordered   Tylenol 650 mg every 6 hours over the counter for pain/fevers Mucinex [phone] mg Twice daily for congestion Saline nasal spray 2-3 times or saline nasal irrigations 1-2 times daily  Delsym 2 tsp Twice daily for cough  Prednisone 40 mg daily for 5 days. Take in AM with food. Ok to start if symptoms do not improve or worsen.   Follow up in 2 weeks with Dr. Loanne Drilling or Katie Ellarose Brandi,NP. If symptoms do not improve or worsen, please contact office for sooner follow up or seek emergency care.

## 2022-10-22 NOTE — Progress Notes (Signed)
@Patient  ID: , female    DOB: 01-01-1991, 31 y.o.   MRN: [id number]  Chief Complaint  Patient presents with   Follow-up    Pt f/u she is experiencing fevers, SOB, coughing, wheezing, some congestion, and she can feel some mucus. Symptoms have been occurring for 3 days.     Referring provider: No ref. provider found  HPI: 31 year old female, never smoker followed for severe asthma on Xolair. She is a patient of Dr. 26 and last seen in office 09/22/2022. Past medical history significant for Guillain-Barre syndrome 2017, ADD, anxiety/depression.   TEST/EVENTS:  10/21/2022 CXR: Both lungs are clear.  09/22/2022: Ov with Dr. 09/24/2022. Seen in UC for asthma flare in June then RSV earlier in September; treated with prednisone taper. Unable to attend Xolair appointments d/t death in the family. Ready to focus on her own health. Compliant with advair. Uses albuterol twice daily for some cough/wheezing. Previously well-controlled on Xolair. Will re-enroll. Counseled on adherence. Recommended calcium and vitamin d supplements for osteoporosis prevention.  10/22/2022: Today - acute Patient presents today for acute visit.  She started having symptoms of a head cold and cough around 3 days ago.  She has had increased shortness of breath and wheezing since.  She does have some clear nasal drainage.  Cough is primarily nonproductive but occasionally with clear sputum.  She is also had fevers, up to 100.6.  Last one was 2 nights ago.  She was seen in the ED yesterday but left due to long wait times.  Chest x-ray was without any evidence of superimposed infection.  She was negative for COVID and flu.  Still feeling fatigued and having headaches.  Denies any sore throat, difficulty swallowing, myalgias/arthralgias, hemoptysis.  She is using Advair twice daily.  Has not started on Xolair yet.  Using albuterol few times a day.  FeNO 19 ppb  Allergies  Allergen Reactions   Actical Diarrhea    Celery Oil Diarrhea   Corn Oil Diarrhea    rash   Iron Sucrose     Other reaction(s): Dizziness, Other (See Comments), Other - See Comments Pt became flushed, hot and diaphoretic after completion of infusion.  Pt became flushed, hot and diaphoretic after completion of infusion.     Milk (Cow) Diarrhea   Nutricap [Actical] Diarrhea   Onion Diarrhea   Other Nausea And Vomiting    Green bell peppers   Pedi-Pre Tape Spray [Wound Dressing Adhesive] Hives   Vanilla Diarrhea    Immunization History  Administered Date(s) Administered   Influenza Split 09/25/2011    Past Medical History:  Diagnosis Date   Asthma    Guillain-Barre (HCC)    Iron deficiency anemia    Pica     Tobacco History: Social History   Tobacco Use  Smoking Status Never  Smokeless Tobacco Never   Counseling given: Not Answered   Outpatient Medications Prior to Visit  Medication Sig Dispense Refill   acetaminophen (TYLENOL) 500 MG tablet Take 1,000 mg by mouth every 6 (six) hours as needed for moderate pain.     albuterol (PROVENTIL) (2.5 MG/3ML) 0.083% nebulizer solution Take 3 mLs by nebulization every 6 (six) hours as needed for shortness of breath or wheezing. 360 mL 5   albuterol (PROVENTIL) (2.5 MG/3ML) 0.083% nebulizer solution Take 3 mL (2.5 mg total) by nebulization every 6 (six) hours if needed for wheezing. 90 mL 3   albuterol (VENTOLIN HFA) 108 (90 Base) MCG/ACT inhaler Inhale 2 puffs  into the lungs every 6 (six) hours as needed for wheezing. 6.7 g 0   albuterol (VENTOLIN HFA) 108 (90 Base) MCG/ACT inhaler inhale 2 puffs by mouth every 6 (six) hours as needed for wheezing 6.7 g 1   benzonatate (TESSALON) 200 MG capsule Take 1 capsule (200 mg total) by mouth 3 (three) times a day if needed for cough for up to 7 days. Do not crush or chew. 20 capsule 0   EPINEPHrine 0.3 mg/0.3 mL IJ SOAJ injection Inject 0.3 mg into the muscle as needed for anaphylaxis. 2 each 5   fluticasone-salmeterol (ADVAIR  DISKUS) 500-50 MCG/ACT AEPB Inhale 1 puff into the lungs in the morning and at bedtime. 60 each 11   meloxicam (MOBIC) 15 MG tablet Take 1 tablet by mouth once a day 30 tablet 0   montelukast (SINGULAIR) 10 MG tablet Take 1 tablet (10 mg total) by mouth at bedtime. 30 tablet 11   ondansetron (ZOFRAN-ODT) 4 MG disintegrating tablet Take 1 tablet (4 mg total) by mouth every 8 (eight) hours as needed for nausea or vomiting. 20 tablet 0   PARoxetine (PAXIL-CR) 12.5 MG 24 hr tablet Take 12.5 mg by mouth every morning.     promethazine-dextromethorphan (PROMETHAZINE-DM) 6.25-15 MG/5ML syrup Take 5 mL by mouth every 8 (eight) hours if needed for cough. 118 mL 0   predniSONE (DELTASONE) 50 MG tablet Take 1 tablet (50 mg total) by mouth daily with breakfast. (Patient not taking: Reported on 10/22/2022) 5 tablet 0   No facility-administered medications prior to visit.     Review of Systems:   Constitutional: No weight loss or gain, night sweats, fevers, chills,  or lassitude. +fatigue HEENT: No difficulty swallowing, tooth/dental problems, or sore throat. No sneezing, itching, ear ache. +Headaches, nasal congestion/drainage CV:  No chest pain, orthopnea, PND, swelling in lower extremities, anasarca, dizziness, palpitations, syncope Resp: +shortness of breath with exertion; wheezing; cough. No excess mucus or change in color of mucus. No hemoptysis.  No chest wall deformity GI:  No heartburn, indigestion, abdominal pain, nausea, vomiting, diarrhea, change in bowel habits, loss of appetite, bloody stools.  Skin: No rash, lesions, ulcerations MSK:  No joint pain or swelling.  No decreased range of motion.  No back pain. Neuro: No dizziness or lightheadedness.  Psych: No depression or anxiety. Mood stable.     Physical Exam:  BP (!) 146/80   Pulse 70   Ht 5' (1.524 m)   Wt 280 lb 3.2 oz (127.1 kg)   LMP 10/14/2022   SpO2 99%   BMI 54.72 kg/m   GEN: Pleasant, interactive, acutely ill appearing;  obese; in no acute distress. HEENT:  Normocephalic and atraumatic. EACs patent bilaterally. TM pearly gray with present light reflex bilaterally. PERRLA. Sclera white. Nasal turbinates erythematous, moist and patent bilaterally. Clear rhinorrhea present. Oropharynx erythematous and moist, without exudate or edema. No lesions, ulcerations NECK:  Supple w/ fair ROM. No JVD present. Normal carotid impulses w/o bruits. Thyroid symmetrical with no goiter or nodules palpated. No lymphadenopathy.   CV: RRR, no m/r/g, no peripheral edema. Pulses intact, +2 bilaterally. No cyanosis, pallor or clubbing. PULMONARY:  Unlabored, regular breathing. Clear bilaterally A&P w/o wheezes/rales/rhonchi. No accessory muscle use.  GI: BS present and normoactive. Soft, non-tender to palpation. No organomegaly or masses detected.  MSK: No erythema, warmth or tenderness. No deformities or joint swelling noted.  Neuro: A/Ox3. No focal deficits noted.   Skin: Warm, no lesions or rashe Psych: Normal affect and behavior.  Judgement and thought content appropriate.     Lab Results:  CBC    Component Value Date/Time   WBC 7.3 08/29/2022 1820   RBC 4.63 08/29/2022 1820   HGB 12.6 08/29/2022 1820   HCT 38.7 08/29/2022 1820   PLT 222 08/29/2022 1820   MCV 83.6 08/29/2022 1820   MCH 27.2 08/29/2022 1820   MCHC 32.6 08/29/2022 1820   RDW 13.6 08/29/2022 1820   LYMPHSABS 1.9 08/29/2022 1820   MONOABS 0.4 08/29/2022 1820   EOSABS 0.2 08/29/2022 1820   BASOSABS 0.0 08/29/2022 1820    BMET    Component Value Date/Time   NA 139 08/29/2022 1820   K 3.7 08/29/2022 1820   CL 106 08/29/2022 1820   CO2 26 08/29/2022 1820   GLUCOSE 115 (H) 08/29/2022 1820   BUN 11 08/29/2022 1820   CREATININE 0.86 08/29/2022 1820   CALCIUM 8.9 08/29/2022 1820   GFRNONAA >60 08/29/2022 1820    BNP No results found for: "BNP"   Imaging:  DG Chest 2 View  Result Date: 10/21/2022 CLINICAL DATA:  Cough. EXAM: CHEST - 2 VIEW  COMPARISON:  August 29, 2022. FINDINGS: The heart size and mediastinal contours are within normal limits. Both lungs are clear. The visualized skeletal structures are unremarkable. IMPRESSION: No active cardiopulmonary disease. Electronically Signed   By: Marijo Conception M.D.   On: 10/21/2022 10:53          No data to display          No results found for: "NITRICOXIDE"      Assessment & Plan:   URI (upper respiratory infection) Constellation of symptoms consistent with viral URI. COVID/flu negative. CXR was without superimposed infection. Discussed duration of viral illness can span 7-10 days. Supportive care advised. She is aware that she should return should symptoms worsen or not improve.  Patient Instructions  Continue Albuterol inhaler 2 puffs or 3 mL neb every 6 hours as needed for shortness of breath or wheezing. Notify if symptoms persist despite rescue inhaler/neb use. Use nebulizer treatment at least twice daily until symptoms improve.  Continue Advair 1 puff Twice daily. Brush tongue and rinse mouth afterwards Continue singulair 1 tab At bedtime  Continue benzonatate 1 capsule Three times a day. Brush tongue and rinse mouth afterwards  Continue flonase nasal spray 2 sprays each nostril daily Continue Xolair injections as ordered   Tylenol 650 mg every 6 hours over the counter for pain/fevers Mucinex [phone] mg Twice daily for congestion Saline nasal spray 2-3 times or saline nasal irrigations 1-2 times daily  Delsym 2 tsp Twice daily for cough  Prednisone 40 mg daily for 5 days. Take in AM with food. Ok to start if symptoms do not improve or worsen.   Follow up in 2 weeks with Dr. Loanne Drilling or Katie Singleton Hickox,NP. If symptoms do not improve or worsen, please contact office for sooner follow up or seek emergency care.     Severe persistent asthma, poorly-controlled She does not appear to be in acute exacerbation today. Lung exam is clear and FeNO is normal. She has  struggled with recent exacerbations and is poorly controlled at baseline; given her risk for decompensation, provided with steroid burst should her symptoms worsen and asthma action plan discussed. Verbalized understanding. She will restart Xolair next week, as long as symptoms have improved. Continue high dose ICS/LABA. Encouraged her to use neb at least twice daily until symptoms improve. Continue singulair for trigger prevention.   I spent  35 minutes of dedicated to the care of this patient on the date of this encounter to include pre-visit review of records, face-to-face time with the patient discussing conditions above, post visit ordering of testing, clinical documentation with the electronic health record, making appropriate referrals as documented, and communicating necessary findings to members of the patients care team.  Noemi Chapel, NP 10/22/2022  Pt aware and understands NP's role.

## 2022-10-22 NOTE — Assessment & Plan Note (Signed)
Constellation of symptoms consistent with viral URI. COVID/flu negative. CXR was without superimposed infection. Discussed duration of viral illness can span 7-10 days. Supportive care advised. She is aware that she should return should symptoms worsen or not improve.  Patient Instructions  Continue Albuterol inhaler 2 puffs or 3 mL neb every 6 hours as needed for shortness of breath or wheezing. Notify if symptoms persist despite rescue inhaler/neb use. Use nebulizer treatment at least twice daily until symptoms improve.  Continue Advair 1 puff Twice daily. Brush tongue and rinse mouth afterwards Continue singulair 1 tab At bedtime  Continue benzonatate 1 capsule Three times a day. Brush tongue and rinse mouth afterwards  Continue flonase nasal spray 2 sprays each nostril daily Continue Xolair injections as ordered   Tylenol 650 mg every 6 hours over the counter for pain/fevers Mucinex [phone] mg Twice daily for congestion Saline nasal spray 2-3 times or saline nasal irrigations 1-2 times daily  Delsym 2 tsp Twice daily for cough  Prednisone 40 mg daily for 5 days. Take in AM with food. Ok to start if symptoms do not improve or worsen.   Follow up in 2 weeks with Dr. Loanne Drilling or Katie Penina Reisner,NP. If symptoms do not improve or worsen, please contact office for sooner follow up or seek emergency care.

## 2022-10-22 NOTE — Assessment & Plan Note (Signed)
She does not appear to be in acute exacerbation today. Lung exam is clear and FeNO is normal. She has struggled with recent exacerbations and is poorly controlled at baseline; given her risk for decompensation, provided with steroid burst should her symptoms worsen and asthma action plan discussed. Verbalized understanding. She will restart Xolair next week, as long as symptoms have improved. Continue high dose ICS/LABA. Encouraged her to use neb at least twice daily until symptoms improve. Continue singulair for trigger prevention.

## 2022-10-27 ENCOUNTER — Other Ambulatory Visit (HOSPITAL_COMMUNITY): Payer: Self-pay

## 2022-10-28 ENCOUNTER — Other Ambulatory Visit: Payer: Self-pay

## 2022-10-28 ENCOUNTER — Other Ambulatory Visit (HOSPITAL_COMMUNITY): Payer: Self-pay

## 2022-10-29 ENCOUNTER — Other Ambulatory Visit (HOSPITAL_COMMUNITY): Payer: Self-pay

## 2022-10-29 ENCOUNTER — Other Ambulatory Visit: Payer: Self-pay

## 2022-10-30 ENCOUNTER — Other Ambulatory Visit: Payer: 59 | Admitting: Pharmacist

## 2022-11-03 ENCOUNTER — Other Ambulatory Visit (HOSPITAL_COMMUNITY): Payer: Self-pay

## 2022-11-03 ENCOUNTER — Other Ambulatory Visit: Payer: Self-pay

## 2022-11-03 ENCOUNTER — Ambulatory Visit: Payer: 59 | Admitting: Pharmacist

## 2022-11-03 ENCOUNTER — Telehealth: Payer: Self-pay | Admitting: Pharmacist

## 2022-11-03 DIAGNOSIS — J4550 Severe persistent asthma, uncomplicated: Secondary | ICD-10-CM

## 2022-11-03 MED ORDER — OMALIZUMAB 150 MG/ML ~~LOC~~ SOSY: 300.0000 mg | PREFILLED_SYRINGE | SUBCUTANEOUS | 1 refills | Status: DC

## 2022-11-03 NOTE — Patient Instructions (Addendum)
See you on December 4th. Please bring your [facility] and boyfriend with you because he needs to be trained on administration as well  Your prescription will need to be shipped from [place] to our CLINIC. Their phone number is [phone]. Please call them THIS WEEK to schedule shipment and confirm address ([address], [place], [place] [address])  Your copay should be affordable. If they cannot bill your [place], please double-check that they are billing through your copay card as secondary coverage. That copay card information is: ID: [id number] Group #: [id number] [id number] [id number]  How to manage an injection site reaction: Remember the 5 C's: COUNTER - leave on the counter at least 30 minutes but up to overnight to bring medication to room temperature. This may help prevent stinging COLD - place something cold (like an ice gel pack or cold water bottle) on the injection site just before cleansing with alcohol. This may help reduce pain CLARITIN - use Claritin (generic name is loratadine) for the first two weeks of treatment or the day of, the day before, and the day after injecting. This will help to minimize injection site reactions CORTISONE CREAM - apply if injection site is irritated and itching CALL ME - if injection site reaction is bigger than the size of your fist, looks infected, blisters, or if you develop hives  Anaphylactic Reactions  Anaphylactic reactions can occur up to 24 hours after administration.  What are the signs and symptoms of anaphylaxis?  Symptoms can vary from a mild skin reaction to more severe reactions including: Wheezing, shortness of breath, cough, chest tightness or trouble breathing Low blood pressure, dizziness, fainting, rapid of weak heartbeat, anxiety, or feeling of "impending doom" Flushing, itching, hives, or feeling warm Swelling of the throat or tongue, throat tightness, hoarse voice, or trouble  swallowing  Some of these symptoms require immediate treatment, as they can be life threatening.  If you experience severe symptoms which are bolded above use your Epipen and call 911 for immediate emergency care.

## 2022-11-03 NOTE — Telephone Encounter (Signed)
Patient re-started Xolair in clinic today (11/03/22). Next dose scheduled for 12/01/2022. Will need to schedule shipment of medication to clinic.  Will call pharmacy next week to schedule shipment  Knox Saliva, PharmD, MPH, BCPS, CPP Clinical Pharmacist (Rheumatology and Pulmonology)

## 2022-11-03 NOTE — Progress Notes (Signed)
HPI Patient presents today to [facility] to see pharmacy team for Xolair new start.  Past medical history includes severe persistent asthma, GBS, history of anxiety and depression, ADD.  Epipen on hand and in date: Yes  She reports difficulty with affording her Advair. 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 Advair copay is high  OBJECTIVE Allergies  Allergen Reactions   Actical Diarrhea   Celery Oil Diarrhea   Corn Oil Diarrhea    rash   Iron Sucrose     Other reaction(s): Dizziness, Other (See Comments), Other - See Comments Pt became flushed, hot and diaphoretic after completion of infusion.  Pt became flushed, hot and diaphoretic after completion of infusion.     Milk (Cow) Diarrhea   Nutricap [Actical] Diarrhea   Onion Diarrhea   Other Nausea And Vomiting    Green bell peppers   Pedi-Pre Tape Spray [Wound Dressing Adhesive] Hives   Vanilla Diarrhea    Outpatient Encounter Medications as of 11/03/2022  Medication Sig   acetaminophen (TYLENOL) 500 MG tablet Take 1,000 mg by mouth every 6 (six) hours as needed for moderate pain.   albuterol (PROVENTIL) (2.5 MG/3ML) 0.083% nebulizer solution Inhale 3 mLs by nebulization every 6 (six) hours as needed for shortness of breath or wheezing.   albuterol (PROVENTIL) (2.5 MG/3ML) 0.083% nebulizer solution Take 3 mL (2.5 mg total) by nebulization every 6 (six) hours if needed for wheezing.   albuterol (VENTOLIN HFA) 108 (90 Base) MCG/ACT inhaler Inhale 2 puffs into the lungs every 6 (six) hours as needed for wheezing.   albuterol (VENTOLIN HFA) 108 (90 Base) MCG/ACT inhaler Inhale 2 puffs by mouth every 6 (six) hours as needed for wheezing   benzonatate (TESSALON) 200 MG capsule Take 1 capsule (200 mg total) by mouth 3 (three) times a  day if needed for cough for up to 7 days. Do not crush or chew.   EPINEPHrine 0.3 mg/0.3 mL IJ SOAJ injection Inject 0.3 mg into the muscle as needed for anaphylaxis.   fluticasone-salmeterol (ADVAIR DISKUS) 500-50 MCG/ACT AEPB Inhale 1 puff into the lungs in the morning and at bedtime.   meloxicam (MOBIC) 15 MG tablet Take 1 tablet by mouth once a day   montelukast (SINGULAIR) 10 MG tablet Take 1 tablet (10 mg total) by mouth at bedtime.   ondansetron (ZOFRAN-ODT) 4 MG disintegrating tablet Take 1 tablet (4 mg total) by mouth every 8 (eight) hours as needed for nausea or vomiting.   PARoxetine (PAXIL-CR) 12.5 MG 24 hr tablet Take 12.5 mg by mouth every morning.   promethazine-dextromethorphan (PROMETHAZINE-DM) 6.25-15 MG/5ML syrup Take 5 mL by mouth every 8 (eight) hours if needed for cough.   No facility-administered encounter medications on file as of 11/03/2022.     Immunization History  Administered Date(s) Administered   Influenza Split 09/25/2011     PFTs     No data to display           Eosinophils Most recent blood eosinophil count was 200 cells/microL taken on 08/29/2022.   IgE: 97 on 04/25/2022 Pre-treatment weight:    Assessment   Biologics training for omalizumab (Xolair)  Goals of therapy: Mechanism: IgG monoclonal antibody (recombinant DNA derived) which inhibits IgE binding to the high-affinity IgE receptor on mast cells and basophils.  Reviewed that Xolair is add-on medication and patient  must continue maintenance inhaler regimen. Response to therapy: may take 3 to 6 months to determine efficacy. Discussed that patients generally feel improvement sooner than 3 months.  Side effects: anaphylaxis (0.1%) (black boxed warning), injection site reaction (45%), arthralgia (2.9% to 8%), headache (3% to 15%)  Dose: 300mg  SQ every 4 weeks based on pre-treatment IgE of 97 and weight of 125.6kg. Patient advised that each dose will be 2 purple pre-filled  syringes.  Administration/Storage:  Reviewed administration sites of thigh or abdomen (at least 2-3 inches away from abdomen). Reviewed the upper arm is only appropriate if caregiver is administering injection. Patient advised of injection site reaction management. Discussed that injections may take 5 to 10 seconds to administer (solution is slightly viscous). Do not inject into moles, scars, bruises, tender areas, or broken skin. Do not shake pre-filled syringe as this could lead to product foaming or precipitation. Do not use if solution is discolored or contains particulate matter or if window on prefilled pen is yellow (indicates pen has been used).  Reviewed storage of medication in refrigerator. Reviewed that Xolair can be stored at room temperature in unopened carton for up to 4 hours only.  Access: Approval of Xolair through: insurance Patient enrolled into copay card program to help with copay assistance.  Patient self-administered Xolair 150mg /mL x 2 syringes in right lower abdomen and in left lower abdomen using sample. Provider administered dose in left abdomen. Patient self-administered in right abdomen.  Xolair 150mg /mL pre-filled syringe x 1 syringe NDC: [phone] Lot: Expiration: 12/2022  Xolair 150mg /mL pre-filled syringe x 1 syringe NDC: [phone] Lot: [id number] Expiration: 12/2022  Patient monitored for 2 hours for adverse reaction (checked in with patient every 20 minutes).  Patient tolerated without issue. Injection site checked and no swelling or redness noted. Patient denied signs of anaphylaxis. Educated on symptoms for monitoring Administered at 1320:  1340: doing well; denies itchiness, redness, trouble breathing 1400: sitting pleasantly on phone 1415: doing fine 1440: no issues reported 1500: patient reports no issues. Requesting charger for her phone 1520: patient dozing off . Denies any issues  Medication Reconciliation  A drug regimen  assessment was performed, including review of allergies, interactions, disease-state management, dosing and immunization history. Medications were reviewed with the patient, including name, instructions, indication, goals of therapy, potential side effects, importance of adherence, and safe use.  Drug interaction(s): none  PLAN Second Xolair dose will be administered in clinic with 30 minute monitoring period. Scheduled for 12/01/2022. Patient advised that Epipen must be on hand at each visit. Third Xolair will be administered in clinic with 30 minute monitoring period - appointment will be scheduled at second-dose visit. She will plan to be accompanied by boyfriend since he will be administering at home. Rx sent to: [facility]: [phone] .  Patient provided with pharmacy phone number and advised to call later this week to schedule shipment to clinic. Patient will ned to provide consent. Patient provided with copay card information to provide to pharmacy if quoted copay exceeds $5 per month. Pharmacy team will follow-up with specialty pharmacy to schedule shipment to clinic. Continue maintenance asthma regimen of: Advair Diskus 500-50 (1 puff twice daily), montelukast 10mg  nightly. We reviewed that a local pharmacy like [facility] or [facility] may be able to bill her [phone] and help bring copay down. However if she cannot, she will call our clinic and we will send rx for generic Airduo. Patient can use GoodRx for this (provided her with printout for  [facility]) and cost is About ~$45 per inhaler  All questions encouraged and answered.  Instructed patient to reach out with any further questions or concerns.  Thank you for allowing pharmacy to participate in this patient's care.  This appointment required 120 minutes of patient care (this includes precharting, chart review, review of results, face-to-face care, etc.).  Knox Saliva, PharmD, MPH, BCPS, CPP Clinical Pharmacist  (Rheumatology and Pulmonology)

## 2022-11-05 ENCOUNTER — Ambulatory Visit: Payer: 59 | Admitting: Nurse Practitioner

## 2022-11-12 NOTE — Telephone Encounter (Signed)
[facility] [place] copay card information updated and is attached below.   BIN: [id number] PCN: [id number] Group: [id number] ID: [id number]  Updated [facility] copay card information.   Georgeann Oppenheim [employer] PharmD Candidate [phone]

## 2022-11-12 NOTE — Telephone Encounter (Signed)
Spoke with [facility] for refill shipment.  Medication will be filled and shipped. Expected arrival date is 11/18/22.   Georgeann Oppenheim [employer] PharmD Candidate [phone]

## 2022-11-13 ENCOUNTER — Other Ambulatory Visit: Payer: Self-pay | Admitting: Pulmonary Disease

## 2022-11-14 ENCOUNTER — Other Ambulatory Visit: Payer: Self-pay

## 2022-11-14 MED ORDER — ALBUTEROL SULFATE HFA 108 (90 BASE) MCG/ACT IN AERS
2.0000 | INHALATION_SPRAY | Freq: Four times a day (QID) | RESPIRATORY_TRACT | 0 refills | Status: DC | PRN
Start: 2022-11-14 — End: 2022-12-01
  Filled 2022-11-14 – 2022-11-25 (×2): qty 6.7, 25d supply, fill #0

## 2022-11-19 NOTE — Telephone Encounter (Signed)
Received Xolair from [facility].   Chesley Mires, PharmD, MPH, BCPS, CPP Clinical Pharmacist (Rheumatology and Pulmonology)

## 2022-11-26 ENCOUNTER — Other Ambulatory Visit: Payer: Self-pay

## 2022-11-28 ENCOUNTER — Other Ambulatory Visit: Payer: Self-pay

## 2022-12-01 ENCOUNTER — Other Ambulatory Visit: Payer: Self-pay

## 2022-12-01 ENCOUNTER — Telehealth: Payer: Self-pay | Admitting: Pharmacist

## 2022-12-01 ENCOUNTER — Ambulatory Visit: Payer: 59 | Admitting: Pharmacist

## 2022-12-01 DIAGNOSIS — Z79899 Other long term (current) drug therapy: Secondary | ICD-10-CM

## 2022-12-01 DIAGNOSIS — J4550 Severe persistent asthma, uncomplicated: Secondary | ICD-10-CM

## 2022-12-01 DIAGNOSIS — Z7189 Other specified counseling: Secondary | ICD-10-CM

## 2022-12-01 MED ORDER — FLUTICASONE-SALMETEROL 232-14 MCG/ACT IN AEPB: 1.0000 | INHALATION_SPRAY | Freq: Two times a day (BID) | RESPIRATORY_TRACT | 5 refills | Status: DC

## 2022-12-01 MED ORDER — ALBUTEROL SULFATE HFA 108 (90 BASE) MCG/ACT IN AERS
2.0000 | INHALATION_SPRAY | Freq: Four times a day (QID) | RESPIRATORY_TRACT | 5 refills | Status: DC | PRN
  Filled 2022-12-01 – 2022-12-15 (×3): qty 6.7, 25d supply, fill #0

## 2022-12-01 NOTE — Patient Instructions (Signed)
We will meet again on 12/30/2022 for your third and final in-office Xolair injection.   START AIRDUO (1 PUFF TWICE DAILY) while you wait to hear back on the Medicaid switch  How to manage an injection site reaction: Remember the 5 C's: COUNTER - leave on the counter at least 30 minutes but up to overnight to bring medication to room temperature. This may help prevent stinging COLD - place something cold (like an ice gel pack or cold water bottle) on the injection site just before cleansing with alcohol. This may help reduce pain CLARITIN - use Claritin (generic name is loratadine) for the first two weeks of treatment or the day of, the day before, and the day after injecting. This will help to minimize injection site reactions CORTISONE CREAM - apply if injection site is irritated and itching CALL ME - if injection site reaction is bigger than the size of your fist, looks infected, blisters, or if you develop hives

## 2022-12-01 NOTE — Progress Notes (Signed)
HPI Patient presents today to [facility] to see pharmacy team for second dose of Xolair. She is accompanied by her partner [place] and her roommate, Justice. They will be helping to administer her medication. Past medical history includes severe persistent asthma, GBS, history of anxiety and depression, ADD.   Patient completed her first dose on 11/03/2022 with two hour monitoring period. Patient tolerated first dose without any issues.  Epipen on hand and in date: Yes  Respiratory Medications Current regimen: Advair Diskus (is not taking due to coverage) and isonly taking albuterol.  Patient reports inhaler coverage issues due to [facility].  OBJECTIVE Allergies  Allergen Reactions   Actical Diarrhea   Celery Oil Diarrhea   Corn Oil Diarrhea    rash   Iron Sucrose     Other reaction(s): Dizziness, Other (See Comments), Other - See Comments Pt became flushed, hot and diaphoretic after completion of infusion.  Pt became flushed, hot and diaphoretic after completion of infusion.     Milk (Cow) Diarrhea   Nutricap [Actical] Diarrhea   Onion Diarrhea   Other Nausea And Vomiting    Green bell peppers   Pedi-Pre Tape Spray [Wound Dressing Adhesive] Hives   Vanilla Diarrhea    Outpatient Encounter Medications as of 12/01/2022  Medication Sig   Fluticasone-Salmeterol (AIRDUO RESPICLICK 232/14) 232-14 MCG/ACT AEPB Inhale 1 puff into the lungs 2 (two) times daily.   acetaminophen (TYLENOL) 500 MG tablet Take 1,000 mg by mouth every 6 (six) hours as needed for moderate pain.   albuterol (PROVENTIL) (2.5 MG/3ML) 0.083% nebulizer solution Take 3 mL (2.5 mg total) by nebulization every 6 (six) hours if needed for wheezing.   albuterol (VENTOLIN HFA) 108 (90 Base) MCG/ACT inhaler Inhale 2 puffs into the lungs every 6 (six) hours as needed for wheezing.   benzonatate (TESSALON) 200 MG capsule Take 1 capsule (200 mg total) by mouth 3 (three) times a day if needed for cough for up to 7  days. Do not crush or chew.   EPINEPHrine 0.3 mg/0.3 mL IJ SOAJ injection Inject 0.3 mg into the muscle as needed for anaphylaxis.   meloxicam (MOBIC) 15 MG tablet Take 1 tablet by mouth once a day   montelukast (SINGULAIR) 10 MG tablet Take 1 tablet (10 mg total) by mouth at bedtime.   omalizumab Geoffry Paradise) 150 MG/ML prefilled syringe Inject 300 mg into the skin every 28 (twenty-eight) days. Deliver to clinic: [address], [address], [place] [address]   ondansetron (ZOFRAN-ODT) 4 MG disintegrating tablet Take 1 tablet (4 mg total) by mouth every 8 (eight) hours as needed for nausea or vomiting.   PARoxetine (PAXIL-CR) 12.5 MG 24 hr tablet Take 12.5 mg by mouth every morning.   promethazine-dextromethorphan (PROMETHAZINE-DM) 6.25-15 MG/5ML syrup Take 5 mL by mouth every 8 (eight) hours if needed for cough.   [DISCONTINUED] albuterol (PROVENTIL) (2.5 MG/3ML) 0.083% nebulizer solution Inhale 3 mLs by nebulization every 6 (six) hours as needed for shortness of breath or wheezing.   [DISCONTINUED] albuterol (VENTOLIN HFA) 108 (90 Base) MCG/ACT inhaler Inhale 2 puffs by mouth every 6 (six) hours as needed for wheezing   [DISCONTINUED] albuterol (VENTOLIN HFA) 108 (90 Base) MCG/ACT inhaler Inhale 2 puffs into the lungs every 6 (six) hours as needed for wheezing.   [DISCONTINUED] fluticasone-salmeterol (ADVAIR DISKUS) 500-50 MCG/ACT AEPB Inhale 1 puff into the lungs in the morning and at bedtime.   No facility-administered encounter medications on file as of 12/01/2022.  Immunization History  Administered Date(s) Administered   Influenza Split 09/25/2011    Eosinophils Most recent blood eosinophil count was 200 cells/microL taken on 08/29/2022.    IgE: 97 on 04/25/2022  Assessment   Biologics training for omalizumab (Xolair)  Patient was thoroughly counseled on goals of therapy, dosing, side effects, and administration technique at first dose visit.  Patient's partner, [place] administered  Xolair 150mg /mL x1 syringes in left upper arm and her roommate, Justice, administered Xolair 150mg /mL x1 syringes in right upper arm Xolair 150mg /mL pre-filled syringe x 2 syringes = 300mg  total dose NDC: [id number] Lot: [id number] Expiration: 08/2023  Patient monitored for 30 minutes for adverse reaction.  Patient tolerated without issue. Injection site checked and no redness or swelling noted. Patient denies trouble breathing, swallowing, etc.  Medication Reconciliation  A drug regimen assessment was performed, including review of allergies, interactions, disease-state management, dosing and immunization history. Medications were reviewed with the patient, including name, instructions, indication, goals of therapy, potential side effects, importance of adherence, and safe use.  Drug interaction(s): none  Patient reports no changes since last visit to medications  PLAN Third Xolair dose will be administered in clinic with 30 minute monitoring period. Scheduled for 12/30/2022. Patient advised that Epipen must be on hand at each visit.  Justice will likely accompany again at that visit. Rx sent to: [place]: [phone] at last visit.  Pharmacy team will reach out to ensure shipment is scheduled to clinic. Continue maintenance asthma regimen of: montelukast 10mg  nightly. Start Airduo Respiclick using Goodrx coupon until insurance coverage is figured out. Goodrx printed for her and rx sent to [facility] per patient's request STOP Advair Diskus (due to cost). She needs to be on a maintenance inhaler so will replace with Airduo for now  All questions encouraged and answered.  Instructed patient to reach out with any further questions or concerns.  Thank you for allowing pharmacy to participate in this patient's care.  This appointment required 60 minutes of patient care (this includes precharting, chart review, review of results, face-to-face care, etc.).   Knox Saliva, PharmD, MPH,  BCPS, CPP Clinical Pharmacist (Rheumatology and Pulmonology)

## 2022-12-01 NOTE — Telephone Encounter (Signed)
Attempted to call [facility] to schedle shipment of Xolair to clinic for 3rd in-office dose that will be on 12/30/2021. However per rep, too early to fill. Will have to f/u after 12/03/22  Chesley Mires, PharmD, MPH, BCPS, CPP Clinical Pharmacist (Rheumatology and Pulmonology)

## 2022-12-03 ENCOUNTER — Other Ambulatory Visit: Payer: Self-pay

## 2022-12-03 ENCOUNTER — Emergency Department: Payer: 59

## 2022-12-03 ENCOUNTER — Emergency Department
Admission: EM | Admit: 2022-12-03 | Discharge: 2022-12-03 | Disposition: A | Discharge status: Home / Self Care | Payer: 59 | Attending: Emergency Medicine | Admitting: Emergency Medicine

## 2022-12-03 DIAGNOSIS — X500XXA Overexertion from strenuous movement or load, initial encounter: Secondary | ICD-10-CM | POA: Diagnosis not present

## 2022-12-03 DIAGNOSIS — I10 Essential (primary) hypertension: Secondary | ICD-10-CM | POA: Diagnosis not present

## 2022-12-03 DIAGNOSIS — M25511 Pain in right shoulder: Secondary | ICD-10-CM | POA: Diagnosis not present

## 2022-12-03 MED ORDER — MELOXICAM 15 MG PO TABS
15.0000 mg | ORAL_TABLET | Freq: Every day | ORAL | 1 refills | Status: DC
  Filled 2022-12-03: qty 30, 30d supply, fill #0

## 2022-12-03 MED ORDER — OXYCODONE HCL 5 MG PO TABS
5.0000 mg | ORAL_TABLET | Freq: Once | ORAL | Status: AC
  Administered 2022-12-03: 5 mg via ORAL
  Filled 2022-12-03: qty 1

## 2022-12-03 MED ORDER — METHOCARBAMOL 500 MG PO TABS
500.0000 mg | ORAL_TABLET | Freq: Every day | ORAL | 0 refills | Status: DC
  Filled 2022-12-03: qty 30, 30d supply, fill #0

## 2022-12-03 NOTE — ED Notes (Signed)
Pt going to XRay

## 2022-12-03 NOTE — ED Notes (Addendum)
Patient stated she was lifting her Mastiff puppy on Saturday when she heard and felt a pop in her left shoulder. Patient stated that she is swollen from her fingertips in the left arm up to her shoulder and has limited ROM. Patient is wearing a sling that she bought for support.

## 2022-12-03 NOTE — ED Triage Notes (Signed)
Pt comes with c/o right shoulder pain. Pt states on Saturday she felt pop in shoulder and now can't lift arm up. Pt states swelling.

## 2022-12-03 NOTE — ED Provider Notes (Addendum)
[facility] Provider Note    Event Date/Time   First MD Initiated Contact with Patient 12/03/22 [phone]     (approximate)   History   Shoulder Injury   HPI  Holly Nielsen is a 31 y.o. female who comes in with right shoulder pain.  Patient reports that on Saturday she was try to lift up her 35 pound puppy and she felt a pop in her right shoulder.  She was told previously that she has bone-on-bone and she is worried that she may have torn her rotator cuff.  She reports difficulty lifting her arm.  Reports taking Tylenol 1 g daily as well as ibuprofen.  She denies any pain in the lower arm.  Denies falling on it.  Denies any other injuries.  Physical Exam   Triage Vital Signs: ED Triage Vitals  Enc Vitals Group     BP 12/03/22 0800 (!) 162/110     Pulse Rate 12/03/22 0800 98     Resp 12/03/22 0800 19     Temp 12/03/22 0800 98 F (36.7 C)     Temp src --      SpO2 12/03/22 0800 94 %     Weight --      Height --      Head Circumference --      Peak Flow --      Pain Score 12/03/22 0758 8     Pain Loc --      Pain Edu? --      Excl. in GC? --     Most recent vital signs: Vitals:   12/03/22 0800  BP: (!) 162/110  Pulse: 98  Resp: 19  Temp: 98 F (36.7 C)  SpO2: 94%     General: Awake, no distress.  CV:  Good peripheral perfusion.  Resp:  Normal effort.  Clear lungs bilaterally Abd:  No distention.  Other:  Patient reports pain in her right shoulder.  She is able to lift her arms up to 90 degrees but then after that she reports increasing pain.  She has got 2+ distal pulse.  No obvious swelling noted.  Skin no redness or warmth of the shoulder.  No deformity noted.  Radial, ulnar, median nerve are intact.  Sensation intact.   ED Results / Procedures / Treatments   Labs (all labs ordered are listed, but only abnormal results are displayed) Labs Reviewed  POC URINE PREG, ED     RADIOLOGY I have reviewed the xray personally and  interpreted and no evidence of any fracture   PROCEDURES:  Critical Care performed: No  Procedures   MEDICATIONS ORDERED IN ED: Medications  oxyCODONE (Oxy IR/ROXICODONE) immediate release tablet 5 mg (has no administration in time range)     IMPRESSION / MDM / ASSESSMENT AND PLAN / ED COURSE  I reviewed the triage vital signs and the nursing notes.   Patient's presentation is most consistent with acute, uncomplicated illness.   Patient comes in with shoulder pain.  X-ray ordered evaluate for any fracture, dislocation.  Suspect patient could have ligament injury.  She will follow-up with orthopedic clinic.  X-ray was negative.  Will give 1 dose of oxycodone here but can follow-up outpatient.  Patient already has a sling.  We discussed exercises to prevent frozen shoulder.  Initially ordered pregnancy test to ensure no pregnancy in case patient wanted Toradol but patient declining Toradol and reports no concerns for pregnancy  Patient's oxygen level was initially charted to  be 94% but upon recheck was 99%.  No shortness of breath at this time. Reports history of asthma and occasional oxygen level around this. Declines need for chest xray. Lung sounds bilaterally on exam.  Patient hypertensive.  Patient will need to follow-up outpatient for blood pressure recheck and to be started on medications if remains hypertensive.  This could just be related to pain.   FINAL CLINICAL IMPRESSION(S) / ED DIAGNOSES   Final diagnoses:  Acute pain of right shoulder  Hypertension, unspecified type     Rx / DC Orders   ED Discharge Orders     None        Note:  This document was prepared using Dragon voice recognition software and may include unintentional dictation errors.   Concha Se, MD 12/03/22 0932    Concha Se, MD 12/03/22 [phone]

## 2022-12-03 NOTE — Discharge Instructions (Addendum)
Take Tylenol 1 g every 8 hours and ibuprofen to help with pain.  Return to the ER if develop worsening symptoms fevers or redness.  Otherwise follow-up with the orthopedic clinic  Your blood pressure was elevated and you should follow-up for recheck in 1 week to see if need to be started on blood pressure medications or if this was just related to your pain.

## 2022-12-03 NOTE — ED Notes (Signed)
D/C and to f/up with [facility] discussed with pt, pt verbalized understanding. Pt ambulatory with steady gait on D/C.

## 2022-12-04 ENCOUNTER — Other Ambulatory Visit: Payer: Self-pay

## 2022-12-04 NOTE — Telephone Encounter (Signed)
Called [facility] to schedule shipment of Xolair to clinic. Per rep, medication is scheduled to be delivered to clinic on 12/09/2022 (signature required)  Phone: [phone]  Chesley Mires, PharmD, MPH, BCPS, CPP Clinical Pharmacist (Rheumatology and Pulmonology)

## 2022-12-08 ENCOUNTER — Other Ambulatory Visit: Payer: Self-pay

## 2022-12-11 NOTE — Telephone Encounter (Signed)
Xolair received from pharmacy. Located in [place].  Chesley Mires, PharmD, MPH, BCPS, CPP Clinical Pharmacist (Rheumatology and Pulmonology)

## 2022-12-15 ENCOUNTER — Other Ambulatory Visit: Payer: Self-pay

## 2022-12-16 ENCOUNTER — Other Ambulatory Visit: Payer: Self-pay | Admitting: Pulmonary Disease

## 2022-12-17 ENCOUNTER — Other Ambulatory Visit: Payer: Self-pay

## 2022-12-17 ENCOUNTER — Other Ambulatory Visit (HOSPITAL_COMMUNITY): Payer: Self-pay

## 2022-12-17 MED ORDER — ALBUTEROL SULFATE (2.5 MG/3ML) 0.083% IN NEBU
3.0000 mL | INHALATION_SOLUTION | Freq: Four times a day (QID) | RESPIRATORY_TRACT | 3 refills | Status: DC | PRN
  Filled 2022-12-17 (×2): qty 90, 8d supply, fill #0
  Filled 2022-12-29: qty 90, 8d supply, fill #1
  Filled 2023-01-09: qty 90, 8d supply, fill #2
  Filled 2023-01-26 (×2): qty 90, 8d supply, fill #3

## 2022-12-18 ENCOUNTER — Other Ambulatory Visit: Payer: Self-pay

## 2022-12-23 NOTE — Progress Notes (Signed)
HPI Patient presents today to [facility] to see pharmacy team for third dose of Xolair. She is accompanied by her friend/roommate, Justice.  Patient completed their first dose on 11/03/2022 with two hour monitoring period. Patient tolerated first dose without issues. Patient completed second dose on 12/01/2022 with 30 minute monitoring period. Patient tolerated second dose without issue. Since I last saw patient - she had one ED visit on 12/24/2022  due to lower respiratory tracy infection. Was prescribed Z-pack and prednisone taper. Has since completed and feeling better  Respiratory Medications Current regimen: has not picked up Airduo from pharmacy, was provided with Goodrx at last visit to use Patient reports adherence challenges  OBJECTIVE Allergies  Allergen Reactions   Actical Diarrhea   Celery Oil Diarrhea   Corn Oil Diarrhea    rash   Iron Sucrose     Other reaction(s): Dizziness, Other (See Comments), Other - See Comments Pt became flushed, hot and diaphoretic after completion of infusion.  Pt became flushed, hot and diaphoretic after completion of infusion.     Milk (Cow) Diarrhea   Nutricap [Actical] Diarrhea   Onion Diarrhea   Other Nausea And Vomiting    Green bell peppers   Pedi-Pre Tape Spray [Wound Dressing Adhesive] Hives   Vanilla Diarrhea    Outpatient Encounter Medications as of 12/30/2022  Medication Sig   acetaminophen (TYLENOL) 500 MG tablet Take 1,000 mg by mouth every 6 (six) hours as needed for moderate pain.   albuterol (PROVENTIL) (2.5 MG/3ML) 0.083% nebulizer solution Inhale 3 mLs into the lungs every 6 (six) hours as needed as needed for wheezing.   albuterol (VENTOLIN HFA) 108 (90 Base) MCG/ACT inhaler Inhale 2 puffs into the lungs every 6 (six) hours as needed for wheezing.   benzonatate (TESSALON) 200 MG capsule Take 1 capsule (200 mg total) by mouth 3 (three) times a day if needed for cough for up to 7 days. Do not crush or chew.    EPINEPHrine 0.3 mg/0.3 mL IJ SOAJ injection Inject 0.3 mg into the muscle as needed for anaphylaxis.   Fluticasone-Salmeterol (AIRDUO RESPICLICK [id number]) [id number] MCG/ACT AEPB Inhale 1 puff into the lungs 2 (two) times daily.   meloxicam (MOBIC) 15 MG tablet Take 1 tablet by mouth once a day   meloxicam (MOBIC) 15 MG tablet Take 1 tablet (15 mg total) by mouth daily with a meal.   methocarbamol (ROBAXIN) 500 MG tablet Take 1 tablet (500 mg total) by mouth at bedtime.   montelukast (SINGULAIR) 10 MG tablet Take 1 tablet (10 mg total) by mouth at bedtime.   omalizumab Arvid Right) 150 MG/ML prefilled syringe Inject 300 mg into the skin every 28 (twenty-eight) days. Deliver to clinic: [address], [place] [address]   ondansetron (ZOFRAN-ODT) 4 MG disintegrating tablet Take 1 tablet (4 mg total) by mouth every 8 (eight) hours as needed for nausea or vomiting.   PARoxetine (PAXIL-CR) 12.5 MG 24 hr tablet Take 12.5 mg by mouth every morning.   promethazine-dextromethorphan (PROMETHAZINE-DM) 6.25-15 MG/5ML syrup Take 5 mL by mouth every 8 (eight) hours if needed for cough.   No facility-administered encounter medications on file as of 12/30/2022.     Immunization History  Administered Date(s) Administered   Influenza Split 09/25/2011     PFTs     No data to display          Eosinophils Most recent blood eosinophil count was 200 cells/microL taken on 08/29/2022.    IgE:  97 on 04/25/2022  Assessment   Biologics training for omalizumab (Xolair)  Patient was thoroughly counseled on goals of therapy, dosing, side effects, and administration technique at first dose visit.  Patient's friend Justice administered Xolair 150mg /mL x 2 in right arm and left arm using medication that was shipped from [facility] Xolair 150mg /mL pre-filled syringe x 2 syringes NDC: [id number] Lot: Expiration: 09/2023   Patient monitored for 30 minutes for adverse reaction.   Patient tolerated without issue. Injection site checked and no redness, swelling noted. Pt denies signs /symptoms of anaphylaxis  Medication Reconciliation  A drug regimen assessment was performed, including review of allergies, interactions, disease-state management, dosing and immunization history. Medications were reviewed with the patient, including name, instructions, indication, goals of therapy, potential side effects, importance of adherence, and safe use.  Drug interaction(s): none noted  PLAN Rx sent to: [facility]: [phone] .  Patient provided with pharmacy phone number and advised to call later this week to schedule shipment to home for self-administration. Patient has already been provided with copay card information to provide to pharmacy if quoted copay exceeds $5 per month.  Pharmacy team called pharmacy to provide consent to ship Xolair directly to patient's home moving forward. Spoke with 10/2023 at [facility] to provide this authorization Patient states she is planning to have change to insurance on 01/29/2023. Advised her to send MyChart message or call [facility] with new information as pharmacy team will have to re-authorize Xolair through new insurance Her roommate, Justice, plans to continue to administer Xolair for patient moving forward at home. Continue maintenance asthma regimen of: montelukast 10mg  nightly Start Airduo - she has not picked up medication and has been relying on albuterol nebulization solution. I have again emphasized importance of remaining compliant with a maintenance inhaler while on Xolair. She verbalized understanding  All questions encouraged and answered.  Instructed patient to reach out with any further questions or concerns.  Thank you for allowing pharmacy to participate in this patient's care.  This appointment required 45 minutes of patient care (this includes precharting, chart review, review of results,  face-to-face care, etc.).   03/30/2023, PharmD, MPH, BCPS, CPP Clinical Pharmacist (Rheumatology and Pulmonology)

## 2022-12-24 ENCOUNTER — Emergency Department
Admission: EM | Admit: 2022-12-24 | Discharge: 2022-12-24 | Disposition: A | Discharge status: Home / Self Care | Payer: 59 | Attending: Emergency Medicine | Admitting: Emergency Medicine

## 2022-12-24 ENCOUNTER — Other Ambulatory Visit: Payer: Self-pay

## 2022-12-24 DIAGNOSIS — R0602 Shortness of breath: Secondary | ICD-10-CM | POA: Diagnosis present

## 2022-12-24 DIAGNOSIS — J22 Unspecified acute lower respiratory infection: Secondary | ICD-10-CM | POA: Diagnosis not present

## 2022-12-24 DIAGNOSIS — J4550 Severe persistent asthma, uncomplicated: Secondary | ICD-10-CM

## 2022-12-24 DIAGNOSIS — Z1152 Encounter for screening for COVID-19: Secondary | ICD-10-CM | POA: Diagnosis not present

## 2022-12-24 DIAGNOSIS — J45909 Unspecified asthma, uncomplicated: Secondary | ICD-10-CM | POA: Insufficient documentation

## 2022-12-24 LAB — RESP PANEL BY RT-PCR (RSV, FLU A&B, COVID)  RVPGX2
Influenza A by PCR: NEGATIVE
Influenza B by PCR: NEGATIVE
Resp Syncytial Virus by PCR: NEGATIVE
SARS Coronavirus 2 by RT PCR: NEGATIVE

## 2022-12-24 MED ORDER — ALBUTEROL SULFATE HFA 108 (90 BASE) MCG/ACT IN AERS
2.0000 | INHALATION_SPRAY | Freq: Four times a day (QID) | RESPIRATORY_TRACT | 5 refills | Status: DC | PRN
  Filled 2022-12-24 – 2023-01-05 (×3): qty 6.7, 25d supply, fill #0
  Filled 2023-02-04: qty 6.7, 25d supply, fill #1
  Filled 2023-02-19 – 2023-03-04 (×3): qty 6.7, 25d supply, fill #2
  Filled 2023-03-19: qty 6.7, 25d supply, fill #3
  Filled 2023-05-12: qty 6.7, 25d supply, fill #4
  Filled 2023-05-29: qty 6.7, 25d supply, fill #5

## 2022-12-24 MED ORDER — PREDNISONE 50 MG PO TABS
50.0000 mg | ORAL_TABLET | Freq: Every day | ORAL | 0 refills | Status: DC
  Filled 2022-12-24: qty 5, 5d supply, fill #0

## 2022-12-24 MED ORDER — AZITHROMYCIN 250 MG PO TABS
ORAL_TABLET | ORAL | 0 refills | Status: DC
  Filled 2022-12-24: qty 6, 5d supply, fill #0

## 2022-12-24 NOTE — ED Triage Notes (Signed)
C/O fever, cough, sob since christmas day.  Tylenol taken one hour PTA  AAOx3.  Skin warm and dry. NAD

## 2022-12-24 NOTE — Discharge Instructions (Addendum)
-  Please take the full course of the azithromycin as prescribed.  Please take the prednisone as well.  -You may utilize the albuterol inhaler as needed.  -Please follow-up with your primary care provider as needed.  -Return to the emergency department anytime if you begin to experience any new or worsening symptoms.

## 2022-12-24 NOTE — ED Provider Notes (Signed)
[facility] Provider Note    None    (approximate)   History   Chief Complaint No chief complaint on file.   HPI Holly Nielsen is a 31 y.o. female, history of morbid obesity, ADD, anxiety/depression, asthma, presents to the emergency department for evaluation of fever, cough, and shortness of breath x 3 days.  Denies chest pain, abdominal pain, flank pain, nausea/vomiting, diarrhea, dysuria, headache, weakness, rash/lesions.  History Limitations: No limitations.        Physical Exam  Triage Vital Signs: ED Triage Vitals [12/24/22 1006]  Enc Vitals Group     BP      Pulse      Resp      Temp      Temp src      SpO2      Weight 280 lb 3.3 oz (127.1 kg)     Height 5' (1.524 m)     Head Circumference      Peak Flow      Pain Score 0     Pain Loc      Pain Edu?      Excl. in GC?     Most recent vital signs: Vitals:   12/24/22 1317  BP: (!) 145/79  Pulse: 86  Resp: 20  Temp: 98.2 F (36.8 C)  SpO2: 96%    General: Awake, NAD.  Skin: Warm, dry. No rashes or lesions.  Eyes: PERRL. Conjunctivae normal.  CV: Good peripheral perfusion.  Resp: Normal effort.  Mild wheezing on exam. Abd: Soft, non-tender. No distention.  Neuro: At baseline. No gross neurological deficits.  Musculoskeletal: Normal ROM of all extremities.  Physical Exam    ED Results / Procedures / Treatments  Labs (all labs ordered are listed, but only abnormal results are displayed) Labs Reviewed  RESP PANEL BY RT-PCR (RSV, FLU A&B, COVID)  [id number]     EKG N/A.    RADIOLOGY  ED Provider Interpretation: N/A.  No results found.  PROCEDURES:  Critical Care performed: N/A.  Procedures    MEDICATIONS ORDERED IN ED: Medications - No data to display   IMPRESSION / MDM / ASSESSMENT AND PLAN / ED COURSE  I reviewed the triage vital signs and the nursing notes.                              Differential diagnosis includes, but is not limited to,  COVID-19, influenza, RSV, community-acquired pneumonia, allergic rhinitis, viral URI   Assessment/Plan Patient presents with cough, congestion, and bodyaches x 3 days.  She appears well clinically, though does have some mild wheezing bilaterally on exam.  She does have underlying history of asthma.  Respiratory panel negative for COVID-19, influenza, or RSV.  Will provide her antibiotics to cover for possible developing pneumonia.  Additionally provide her with albuterol and azithromycin as well.  Recommend that she follow-up with her primary care provider as needed.  Will discharge.  Provided the patient with anticipatory guidance, return precautions, and educational material. Encouraged the patient to return to the emergency department at any time if they begin to experience any new or worsening symptoms. Patient expressed understanding and agreed with the plan.   Patient's presentation is most consistent with acute complicated illness / injury requiring diagnostic workup.       FINAL CLINICAL IMPRESSION(S) / ED DIAGNOSES   Final diagnoses:  Lower respiratory infection     Rx / DC Orders  ED Discharge Orders          Ordered    predniSONE (DELTASONE) 50 MG tablet  Daily with breakfast        12/24/22 1305    azithromycin (ZITHROMAX) 250 MG tablet  Daily        12/24/22 1305    albuterol (VENTOLIN HFA) 108 (90 Base) MCG/ACT inhaler  Every 6 hours PRN        12/24/22 1305             Note:  This document was prepared using Dragon voice recognition software and may include unintentional dictation errors.   Teodoro Spray, [place] 12/24/22 Lurena Nida    Harvest Dark, MD 12/25/22 [phone]

## 2022-12-24 NOTE — ED Provider Triage Note (Signed)
Emergency Medicine Provider Triage Evaluation Note  Holly Nielsen , a 31 y.o. female  was evaluated in triage.  Pt complains of cough, congestion, nausea, vomiting, and diarrhea x 2 days.  Review of Systems  Positive: above Negative:   Physical Exam  Ht 5' (1.524 m)   Wt 127.1 kg   BMI 54.72 kg/m  Gen:   Awake, no distress   Resp:  Normal effort  MSK:   Moves extremities without difficulty  Other:    Medical Decision Making  Medically screening exam initiated at 11:40 AM.  Appropriate orders placed.  Holly Nielsen was informed that the remainder of the evaluation will be completed by another provider, this initial triage assessment does not replace that evaluation, and the importance of remaining in the ED until their evaluation is complete.     Tommi Rumps, PA-C 12/24/22 1141

## 2022-12-24 NOTE — Progress Notes (Signed)
@Patient  ID: Holly Nielsen, female    DOB: August 24, 1991, 31 y.o.   MRN: [id number]  Chief Complaint  Patient presents with   Acute Visit    Chills, diarrhea, nausea, cough, wheeze and SOB x 3 days.  Seen at [facility] yesterday.  Labs negative    Referring provider: No ref. provider found  HPI: 31 year old female, never smoked.  Past medical history significant for severe persistent asthma.  12/25/2022 Patient presents today for acute office visit due to bronchitis symptoms. She develop fatigue, shortness of breath, cough and wheezing symptoms on Christmas day. Cough is mostly dry. Her boyfriend is sick with similar symptoms and grandmother tested positive for covid. She went to [facility] regional ED on 12/24/22, Covid, flu and RSV negative. She was prescribed zpack and prednisone taper which she has not started yet. She has been taking Alka Seltzer  cold and flu medication.  She is compliant with Airduo 1 puff twice a day. Using Albuterol nebulizer 2-3 times a day with some relief. Her last xolair injection on December 4th.    Allergies  Allergen Reactions   Actical Diarrhea   Celery Oil Diarrhea   Corn Oil Diarrhea    rash   Iron Sucrose     Other reaction(s): Dizziness, Other (See Comments), Other - See Comments Pt became flushed, hot and diaphoretic after completion of infusion.  Pt became flushed, hot and diaphoretic after completion of infusion.     Milk (Cow) Diarrhea   Nutricap [Actical] Diarrhea   Onion Diarrhea   Other Nausea And Vomiting    Green bell peppers   Pedi-Pre Tape Spray [Wound Dressing Adhesive] Hives   Vanilla Diarrhea    Immunization History  Administered Date(s) Administered   Influenza Split 09/25/2011    Past Medical History:  Diagnosis Date   Asthma    Guillain-Barre (HCC)    Iron deficiency anemia    Pica     Tobacco History: Social History   Tobacco Use  Smoking Status Never  Smokeless Tobacco Never   Counseling given: Not  Answered   Outpatient Medications Prior to Visit  Medication Sig Dispense Refill   acetaminophen (TYLENOL) 500 MG tablet Take 1,000 mg by mouth every 6 (six) hours as needed for moderate pain.     albuterol (PROVENTIL) (2.5 MG/3ML) 0.083% nebulizer solution Inhale 3 mLs into the lungs every 6 (six) hours as needed as needed for wheezing. 90 mL 3   albuterol (VENTOLIN HFA) 108 (90 Base) MCG/ACT inhaler Inhale 2 puffs into the lungs every 6 (six) hours as needed for wheezing. 6.7 g 5   azithromycin (ZITHROMAX) 250 MG tablet Take 2 tablets (500 mg total) by mouth daily for 1 day, THEN 1 tablet (250 mg total) daily for 4 days. 6 tablet 0   EPINEPHrine 0.3 mg/0.3 mL IJ SOAJ injection Inject 0.3 mg into the muscle as needed for anaphylaxis. 2 each 5   Fluticasone-Salmeterol (AIRDUO RESPICLICK 232/14) 232-14 MCG/ACT AEPB Inhale 1 puff into the lungs 2 (two) times daily. 1 each 5   methocarbamol (ROBAXIN) 500 MG tablet Take 1 tablet (500 mg total) by mouth at bedtime. 30 tablet 0   montelukast (SINGULAIR) 10 MG tablet Take 1 tablet (10 mg total) by mouth at bedtime. 30 tablet 11   omalizumab (XOLAIR) 150 MG/ML prefilled syringe Inject 300 mg into the skin every 28 (twenty-eight) days. Deliver to clinic: [address], [place] [address] 2 mL 1   ondansetron (ZOFRAN-ODT) 4 MG disintegrating  tablet Take 1 tablet (4 mg total) by mouth every 8 (eight) hours as needed for nausea or vomiting. 20 tablet 0   PARoxetine (PAXIL-CR) 12.5 MG 24 hr tablet Take 12.5 mg by mouth every morning.     predniSONE (DELTASONE) 50 MG tablet Take 1 tablet (50 mg total) by mouth daily with breakfast for 5 days. 5 tablet 0   benzonatate (TESSALON) 200 MG capsule Take 1 capsule (200 mg total) by mouth 3 (three) times a day if needed for cough for up to 7 days. Do not crush or chew. (Patient not taking: Reported on 12/25/2022) 20 capsule 0   meloxicam (MOBIC) 15 MG tablet Take 1 tablet by mouth once a day (Patient not  taking: Reported on 12/25/2022) 30 tablet 0   meloxicam (MOBIC) 15 MG tablet Take 1 tablet (15 mg total) by mouth daily with a meal. (Patient not taking: Reported on 12/25/2022) 30 tablet 1   promethazine-dextromethorphan (PROMETHAZINE-DM) 6.25-15 MG/5ML syrup Take 5 mL by mouth every 8 (eight) hours if needed for cough. (Patient not taking: Reported on 12/25/2022) 118 mL 0   No facility-administered medications prior to visit.   Review of Systems  Review of Systems  Constitutional: Negative.   HENT: Negative.    Respiratory:  Positive for cough, chest tightness, shortness of breath and wheezing.   Cardiovascular: Negative.     Physical Exam  BP 128/84 (BP Location: Left Arm, Patient Position: Sitting, Cuff Size: Large)   Pulse 89   Temp 97.8 F (36.6 C) (Oral)   Ht 5' (1.524 m)   Wt 277 lb (125.6 kg)   SpO2 96%   BMI 54.10 kg/m  Physical Exam Constitutional:      Appearance: Normal appearance.  HENT:     Head: Normocephalic and atraumatic.     Mouth/Throat:     Mouth: Mucous membranes are moist.     Pharynx: Oropharynx is clear.  Cardiovascular:     Rate and Rhythm: Normal rate and regular rhythm.  Pulmonary:     Effort: Pulmonary effort is normal.     Breath sounds: Wheezing present.     Comments: Wheezing RML Musculoskeletal:        General: Normal range of motion.     Cervical back: Normal range of motion and neck supple.  Skin:    General: Skin is warm and dry.  Neurological:     General: No focal deficit present.     Mental Status: She is alert and oriented to person, place, and time. Mental status is at baseline.  Psychiatric:        Mood and Affect: Mood normal.        Behavior: Behavior normal.        Thought Content: Thought content normal.        Judgment: Judgment normal.      Lab Results:  CBC    Component Value Date/Time   WBC 7.3 08/29/2022 1820   RBC 4.63 08/29/2022 1820   HGB 12.6 08/29/2022 1820   HCT 38.7 08/29/2022 1820   PLT 222  08/29/2022 1820   MCV 83.6 08/29/2022 1820   MCH 27.2 08/29/2022 1820   MCHC 32.6 08/29/2022 1820   RDW 13.6 08/29/2022 1820   LYMPHSABS 1.9 08/29/2022 1820   MONOABS 0.4 08/29/2022 1820   EOSABS 0.2 08/29/2022 1820   BASOSABS 0.0 08/29/2022 1820    BMET    Component Value Date/Time   NA 139 08/29/2022 1820   K 3.7 08/29/2022 1820  CL 106 08/29/2022 1820   CO2 26 08/29/2022 1820   GLUCOSE 115 (H) 08/29/2022 1820   BUN 11 08/29/2022 1820   CREATININE 0.86 08/29/2022 1820   CALCIUM 8.9 08/29/2022 1820   GFRNONAA >60 08/29/2022 1820    BNP No results found for: "BNP"  ProBNP No results found for: "PROBNP"  Imaging: DG Shoulder Right  Result Date: 12/03/2022 CLINICAL DATA:  Lifting injury 4 days prior. Felt a pop in left shoulder. Left arm swelling. EXAM: RIGHT SHOULDER - 2+ VIEW COMPARISON:  None Available. FINDINGS: Findings normal bone mineralization. The glenohumeral and acromioclavicular joint spaces are preserved and appropriately aligned. Joint spaces are preserved. No acute fracture or dislocation. The visualized portion of the right lung is unremarkable. IMPRESSION: Normal radiographs of the right shoulder. Electronically Signed   By: Neita Garnet M.D.   On: 12/03/2022 09:02     Assessment & Plan:   Severe persistent asthma with acute bronchitis and acute exacerbation - Acute symptoms of sob, cough and wheezing started 3 days ago.  - RSV, influenzae and covid were negative on 12/24/22 - Continue AirDuo 1 puff morning and evening - Continue to use Albuterol nebulizer every 4-6 hours as needed for shortness of breath or wheezing - Continue Singulair 10mg  at bedtime - Continue Xolair injections as directed - Received depomedrol 80mg  IM x 1 today in office  - Checking CXR today  - Start Z-Pak today; Start prednisone taper tomorrow - Please check home COVID swab tomorrow on 12/26/2022 and notify office if positive (you would still be in the window to receive  antiviral)  Office treatment Depo-Medrol 80 mg IM x 1  Orders Chest x-ray  Follow-up If symptoms do not improve or worsen please call or notify office   Glenford Bayley, NP 12/25/2022

## 2022-12-25 ENCOUNTER — Ambulatory Visit (INDEPENDENT_AMBULATORY_CARE_PROVIDER_SITE_OTHER): Payer: 59

## 2022-12-25 ENCOUNTER — Ambulatory Visit (INDEPENDENT_AMBULATORY_CARE_PROVIDER_SITE_OTHER): Payer: 59 | Admitting: Primary Care

## 2022-12-25 ENCOUNTER — Other Ambulatory Visit: Payer: Self-pay

## 2022-12-25 ENCOUNTER — Other Ambulatory Visit: Payer: Self-pay | Admitting: Pulmonary Disease

## 2022-12-25 ENCOUNTER — Encounter: Payer: Self-pay | Admitting: Primary Care

## 2022-12-25 VITALS — BP 128/84 | HR 89 | Temp 97.8°F | Ht 60.0 in | Wt 277.0 lb

## 2022-12-25 DIAGNOSIS — J4551 Severe persistent asthma with (acute) exacerbation: Secondary | ICD-10-CM

## 2022-12-25 DIAGNOSIS — J209 Acute bronchitis, unspecified: Secondary | ICD-10-CM

## 2022-12-25 DIAGNOSIS — J4550 Severe persistent asthma, uncomplicated: Secondary | ICD-10-CM

## 2022-12-25 IMAGING — CR DG CHEST 2V
2 series · 2 of 2 positions shown · non-contrast
Comparison: Chest x-ray 01/05/2022

CLINICAL DATA: Shortness of breath.

EXAM:
CHEST - 2 VIEW

[w chest lat]
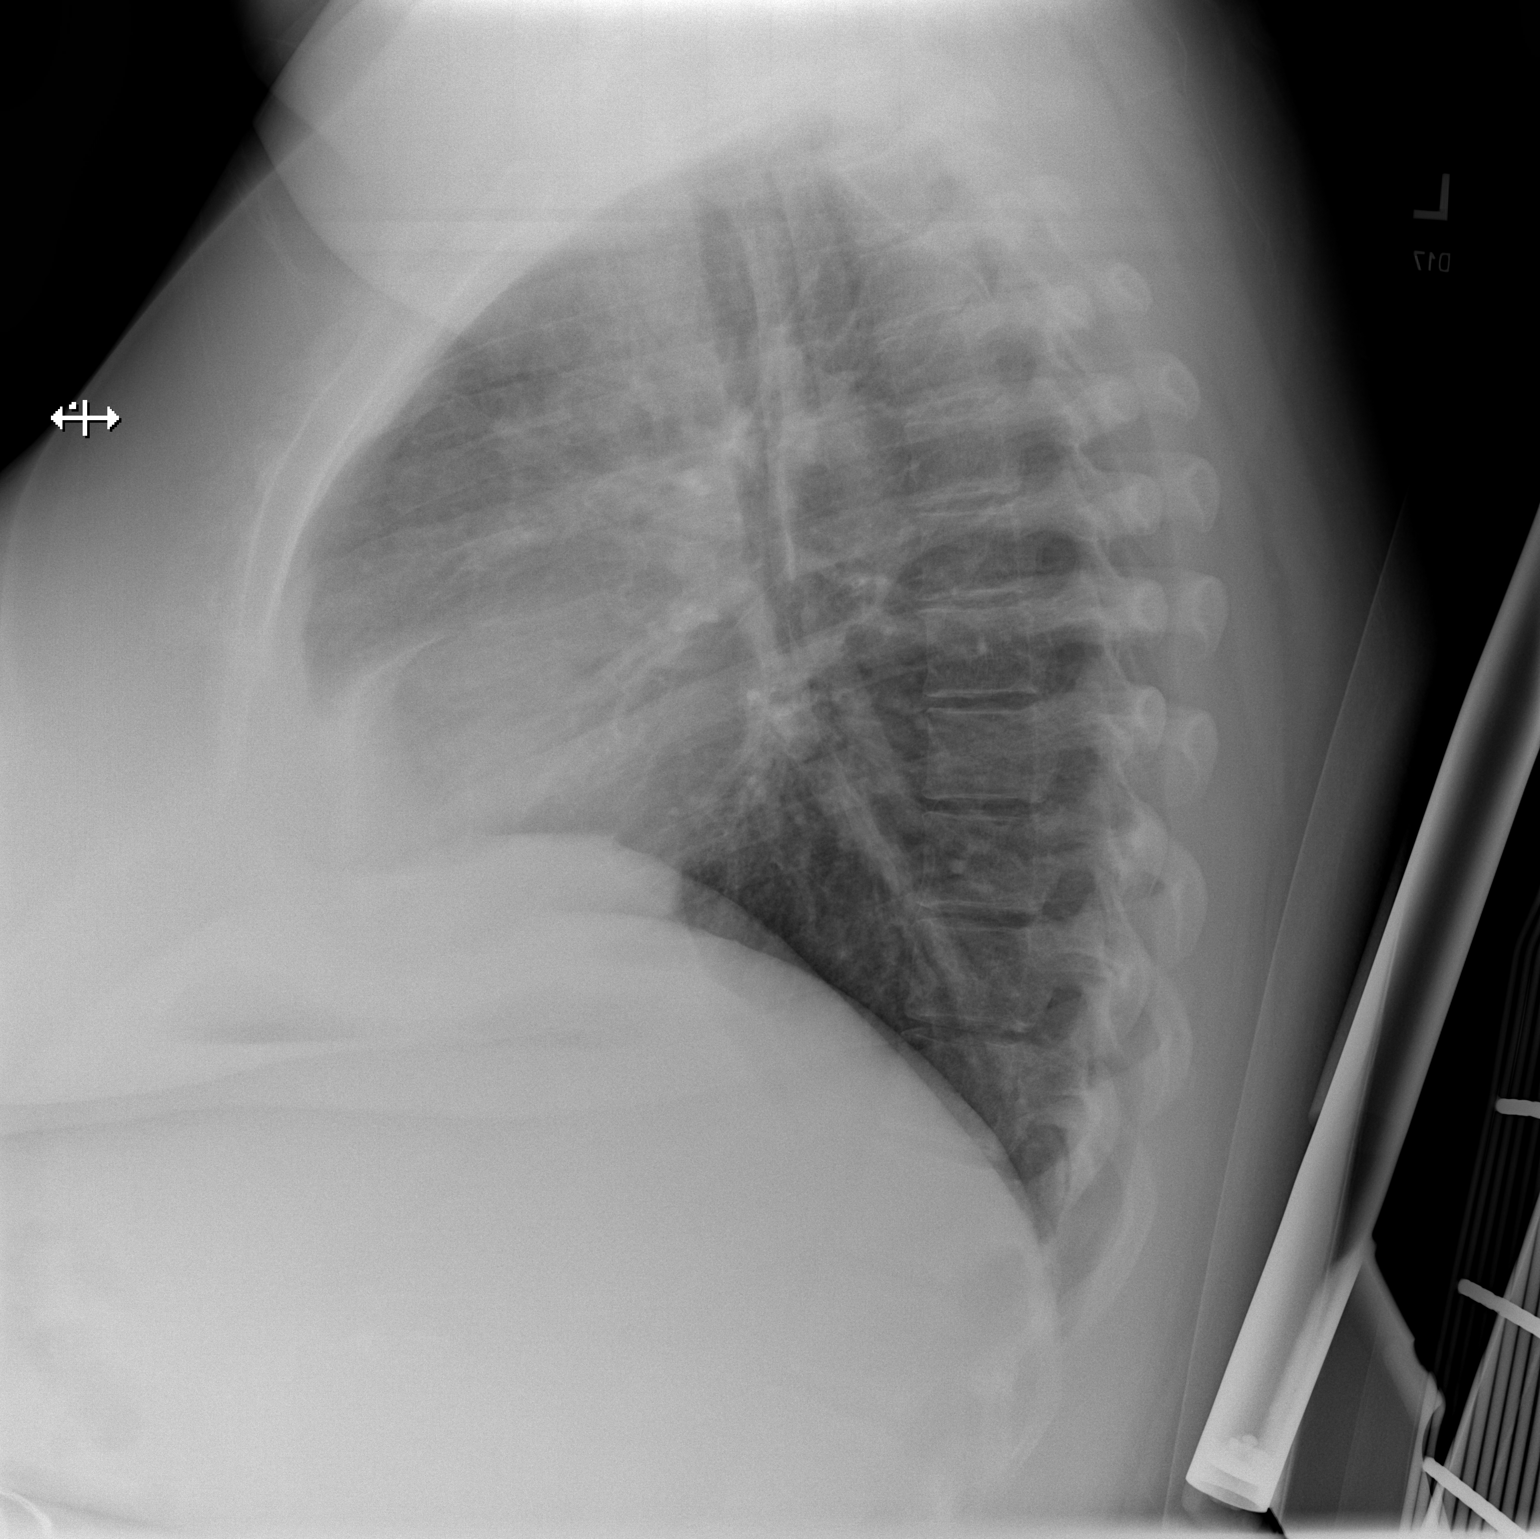

[x chest ap]
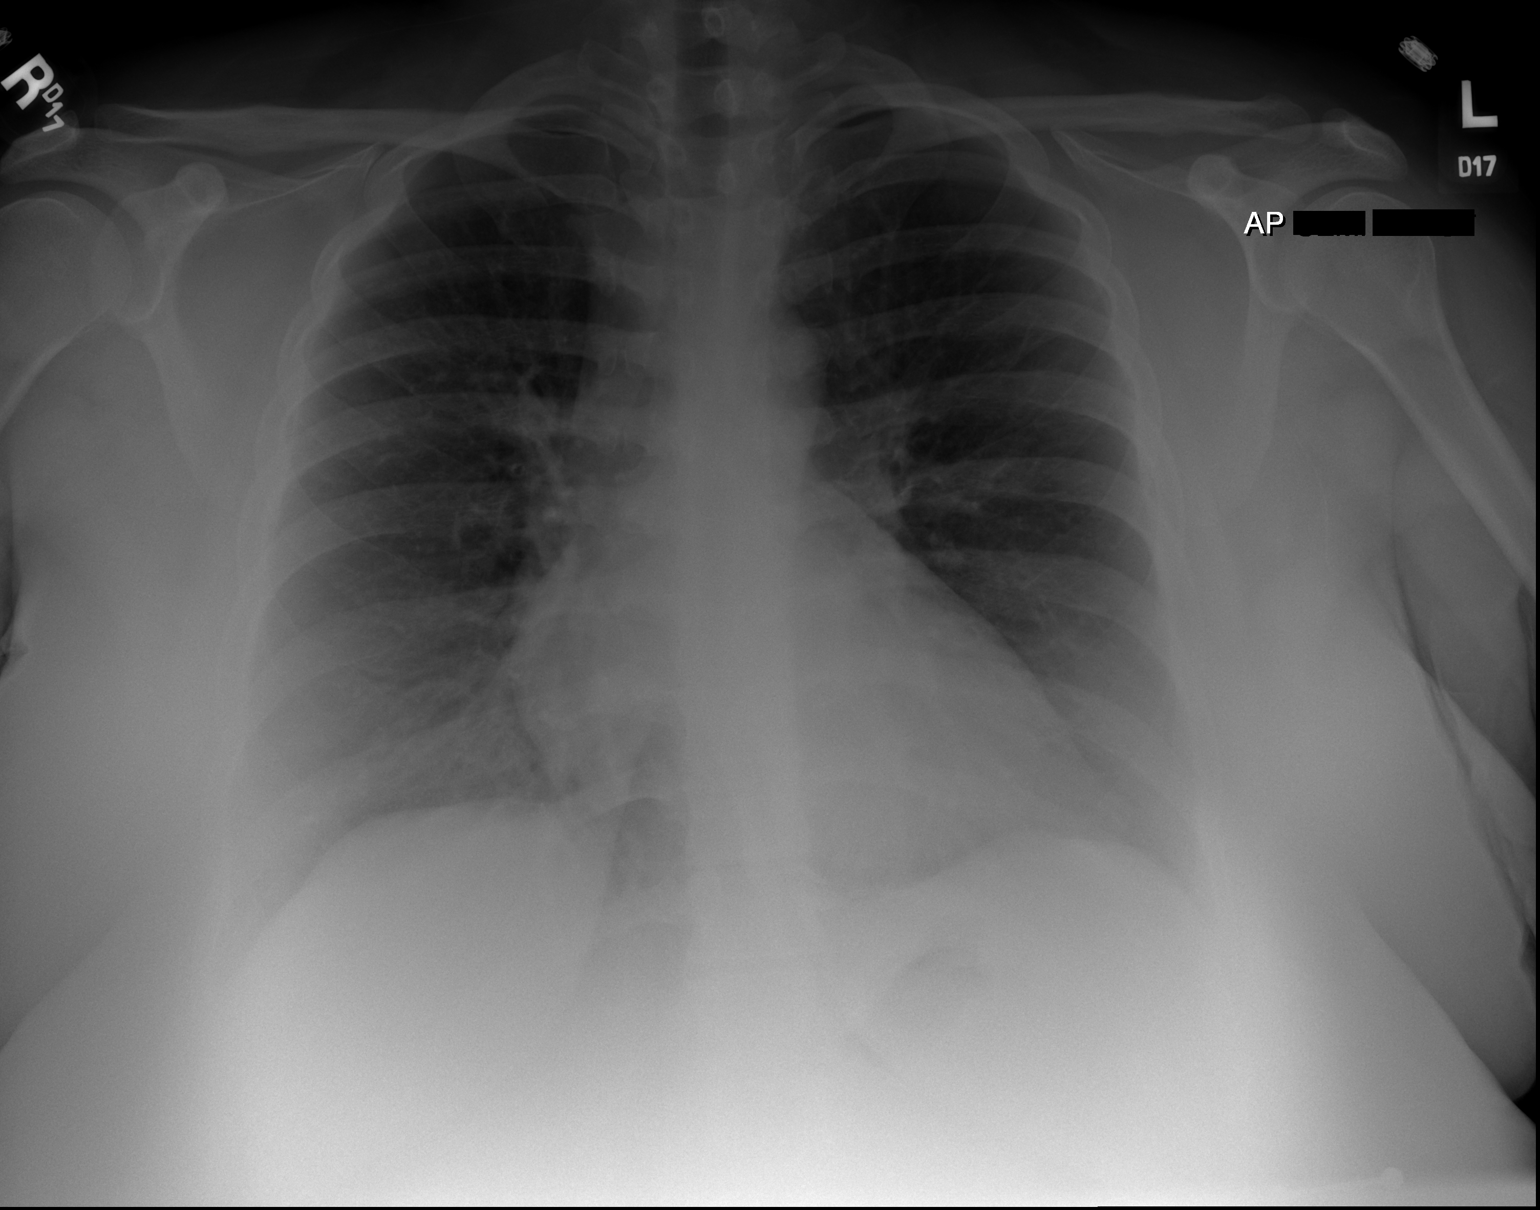

[2 of 2 positions shown; findings below may reference images not displayed]

FINDINGS: The heart size and mediastinal contours are within normal limits.
Both lungs are clear. The visualized skeletal structures are
unremarkable.
IMPRESSION: No active cardiopulmonary disease.

## 2022-12-25 MED ORDER — METHYLPREDNISOLONE ACETATE 80 MG/ML IJ SUSP
80.0000 mg | Freq: Once | INTRAMUSCULAR | Status: AC
  Administered 2022-12-25: 80 mg via INTRAMUSCULAR

## 2022-12-25 NOTE — Assessment & Plan Note (Addendum)
-   Acute symptoms of sob, cough and wheezing started 3 days ago. RSV, influenza and covid were negative on 12/24/22.  - Continue AirDuo 1 puff morning and evening - Continue to use Albuterol nebulizer every 4-6 hours as needed for shortness of breath or wheezing - Continue Singulair 10mg  at bedtime - Continue Xolair injections as directed - Received depomedrol 80mg  IM x 1 today in office  - Checking CXR today  - Start Z-Pak today; Start prednisone taper tomorrow - Please check home COVID swab tomorrow on 12/26/2022 and notify office if positive (you would still be in the window to receive antiviral)  Office treatment Depo-Medrol 80 mg IM x 1  Orders Chest x-ray  Follow-up If symptoms do not improve or worsen please call or notify office

## 2022-12-25 NOTE — Patient Instructions (Addendum)
Recommendations: - Continue AirDuo 1 puff morning and evening - Continue to use nebulizer every 4-6 hours as needed for shortness of breath or wheezing - Continue Xolair injections as directed - -Start Z-Pak today - Start prednisone taper tomorrow - Please check home COVID swab tomorrow on 12/26/2022 and notify office if positive (you would still be in the window to receive antiviral)  Office treatment Depo-Medrol 80 mg IM x 1  Orders Chest x-ray  Follow-up If symptoms do not improve or worsen please call or notify office

## 2022-12-26 NOTE — Progress Notes (Signed)
Please let patient know CXR showed clear lungs

## 2022-12-30 ENCOUNTER — Telehealth: Payer: Self-pay | Admitting: Pharmacist

## 2022-12-30 ENCOUNTER — Other Ambulatory Visit: Payer: Self-pay

## 2022-12-30 ENCOUNTER — Ambulatory Visit: Payer: 59 | Admitting: Pharmacist

## 2022-12-30 DIAGNOSIS — Z7189 Other specified counseling: Secondary | ICD-10-CM

## 2022-12-30 DIAGNOSIS — J4550 Severe persistent asthma, uncomplicated: Secondary | ICD-10-CM

## 2022-12-30 MED ORDER — OMALIZUMAB 150 MG/ML ~~LOC~~ SOSY: 300.0000 mg | PREFILLED_SYRINGE | SUBCUTANEOUS | 2 refills | Status: DC

## 2022-12-30 NOTE — Patient Instructions (Signed)
Your next XOLAIR dose is due on 01/27/2023, 02/24/2023, and every 4 weeks thereafter It will be TWO purple syringes each time. Use different injection sites please  START Airduo (rx is at [facility] on [address]) - you need to be on a maintenance inhaler with Xolair. CONTINUE montelukast 10mg  nightly  Your prescription will be shipped from [facility]. Their phone number is [phone]  Please call to schedule shipment and confirm address. They will mail your medication to your home.  Your copay should be affordable. If you call the pharmacy and it is not affordable, please double-check that they are billing through your copay card as secondary coverage. That copay card information is: BIN: [id number] PCN: [id number] Group: [id number] ID: [id number]  You will need to be seen by your provider in 3 to 4 months to assess how [facility] [employer] is working for you. Please ensure you have a follow-up appointment scheduled in March or April 2024. Call our clinic if you need to make this appointment.  How to manage an injection site reaction: Remember the 5 C's: COUNTER - leave on the counter at least 30 minutes but up to overnight to bring medication to room temperature. This may help prevent stinging COLD - place something cold (like an ice gel pack or cold water bottle) on the injection site just before cleansing with alcohol. This may help reduce pain CLARITIN - use Claritin (generic name is loratadine) for the first two weeks of treatment or the day of, the day before, and the day after injecting. This will help to minimize injection site reactions CORTISONE CREAM - apply if injection site is irritated and itching CALL ME - if injection site reaction is bigger than the size of your fist, looks infected, blisters, or if you develop hives

## 2022-12-30 NOTE — Telephone Encounter (Signed)
Patient completed her last Xolair injection in clinic today and mentioned that she will have [employer] plan that will become active on 01/29/2023. She was advised to upload picture of insurance into chart via [place] but have started this encounter just in case we don't hear from patient by then. Patient aware that she will likely have to transition pharmacies  Knox Saliva, PharmD, MPH, BCPS, CPP Clinical Pharmacist (Rheumatology and Pulmonology)

## 2022-12-30 NOTE — Progress Notes (Signed)
Called patient.  Gave information.  No further questions.

## 2022-12-30 NOTE — Telephone Encounter (Signed)
Spoke with Jenell Milliner at [facility] and provided verbal authorization to send all future fills directly to patient's home as she completed three doses in clinic and has Epipen on hand and is trained on use  Knox Saliva, PharmD, MPH, BCPS, CPP Clinical Pharmacist (Rheumatology and Pulmonology)

## 2023-01-05 ENCOUNTER — Other Ambulatory Visit: Payer: Self-pay

## 2023-01-09 ENCOUNTER — Other Ambulatory Visit: Payer: Self-pay

## 2023-01-12 ENCOUNTER — Telehealth: Payer: Self-pay | Admitting: Pulmonary Disease

## 2023-01-12 NOTE — Telephone Encounter (Signed)
Received message from [place]. Requesting to see pt sometime in March at [facility] location. LMTCB

## 2023-01-26 ENCOUNTER — Other Ambulatory Visit: Payer: Self-pay

## 2023-02-04 ENCOUNTER — Other Ambulatory Visit: Payer: Self-pay

## 2023-02-04 ENCOUNTER — Telehealth: Payer: Self-pay | Admitting: Pulmonary Disease

## 2023-02-05 ENCOUNTER — Other Ambulatory Visit: Payer: Self-pay

## 2023-02-05 MED ORDER — ALBUTEROL SULFATE (2.5 MG/3ML) 0.083% IN NEBU
3.0000 mL | INHALATION_SOLUTION | Freq: Four times a day (QID) | RESPIRATORY_TRACT | 1 refills | Status: DC | PRN
  Filled 2023-02-05: qty 90, 8d supply, fill #0
  Filled 2023-02-19: qty 90, 8d supply, fill #1

## 2023-02-05 NOTE — Telephone Encounter (Signed)
Medication has been sent to pharmacy listed

## 2023-02-06 ENCOUNTER — Other Ambulatory Visit: Payer: Self-pay

## 2023-02-06 ENCOUNTER — Other Ambulatory Visit (HOSPITAL_COMMUNITY): Payer: Self-pay

## 2023-02-06 NOTE — Telephone Encounter (Signed)
No new insurance populates. I called patient. She states she never got new insurance and still has Medicaid plan. She confirmed no issues with receiving medication from pharmacy. Closing encounter.  Knox Saliva, PharmD, MPH, BCPS, CPP Clinical Pharmacist (Rheumatology and Pulmonology)

## 2023-02-19 ENCOUNTER — Other Ambulatory Visit: Payer: Self-pay

## 2023-02-23 ENCOUNTER — Other Ambulatory Visit: Payer: Self-pay

## 2023-03-04 ENCOUNTER — Other Ambulatory Visit: Payer: Self-pay | Admitting: Pulmonary Disease

## 2023-03-05 ENCOUNTER — Other Ambulatory Visit: Payer: Self-pay

## 2023-03-05 MED ORDER — ALBUTEROL SULFATE (2.5 MG/3ML) 0.083% IN NEBU
3.0000 mL | INHALATION_SOLUTION | Freq: Four times a day (QID) | RESPIRATORY_TRACT | 1 refills | Status: DC | PRN
  Filled 2023-03-05: qty 90, 8d supply, fill #0
  Filled 2023-03-19: qty 90, 8d supply, fill #1

## 2023-03-19 ENCOUNTER — Other Ambulatory Visit: Payer: Self-pay

## 2023-04-01 ENCOUNTER — Other Ambulatory Visit: Payer: Self-pay | Admitting: Pulmonary Disease

## 2023-04-02 ENCOUNTER — Encounter (HOSPITAL_COMMUNITY): Payer: Self-pay | Admitting: Pharmacy Technician

## 2023-04-02 ENCOUNTER — Other Ambulatory Visit: Payer: Self-pay

## 2023-04-02 ENCOUNTER — Emergency Department (HOSPITAL_COMMUNITY): Payer: Self-pay

## 2023-04-02 ENCOUNTER — Emergency Department (HOSPITAL_COMMUNITY)
Admission: EM | Admit: 2023-04-02 | Discharge: 2023-04-02 | Disposition: A | Discharge status: Home / Self Care | Payer: Self-pay | Attending: Emergency Medicine | Admitting: Emergency Medicine

## 2023-04-02 ENCOUNTER — Telehealth: Payer: Self-pay | Admitting: Primary Care

## 2023-04-02 DIAGNOSIS — J45901 Unspecified asthma with (acute) exacerbation: Secondary | ICD-10-CM | POA: Insufficient documentation

## 2023-04-02 DIAGNOSIS — Z1152 Encounter for screening for COVID-19: Secondary | ICD-10-CM | POA: Insufficient documentation

## 2023-04-02 DIAGNOSIS — R0602 Shortness of breath: Secondary | ICD-10-CM

## 2023-04-02 LAB — CBC WITH DIFFERENTIAL/PLATELET
Abs Immature Granulocytes: 0.04 10*3/uL (ref 0.00–0.07)
Basophils Absolute: 0 10*3/uL (ref 0.0–0.1)
Basophils Relative: 0 %
Eosinophils Absolute: 0.3 10*3/uL (ref 0.0–0.5)
Eosinophils Relative: 4 %
HCT: 40.9 % (ref 36.0–46.0)
Hemoglobin: 13.2 g/dL (ref 12.0–15.0)
Immature Granulocytes: 0 %
Lymphocytes Relative: 24 %
Lymphs Abs: 2.2 10*3/uL (ref 0.7–4.0)
MCH: 26.2 pg (ref 26.0–34.0)
MCHC: 32.3 g/dL (ref 30.0–36.0)
MCV: 81.3 fL (ref 80.0–100.0)
Monocytes Absolute: 0.5 10*3/uL (ref 0.1–1.0)
Monocytes Relative: 6 %
Neutro Abs: 6.1 10*3/uL (ref 1.7–7.7)
Neutrophils Relative %: 66 %
Platelets: 259 10*3/uL (ref 150–400)
RBC: 5.03 MIL/uL (ref 3.87–5.11)
RDW: 14.5 % (ref 11.5–15.5)
WBC: 9.2 10*3/uL (ref 4.0–10.5)
nRBC: 0 % (ref 0.0–0.2)

## 2023-04-02 LAB — BASIC METABOLIC PANEL
Anion gap: 9 (ref 5–15)
BUN: 13 mg/dL (ref 6–20)
CO2: 26 mmol/L (ref 22–32)
Calcium: 9 mg/dL (ref 8.9–10.3)
Chloride: 105 mmol/L (ref 98–111)
Creatinine, Ser: 0.89 mg/dL (ref 0.44–1.00)
GFR, Estimated: >60 mL/min (ref >60)
Glucose, Bld: 101 mg/dL — ABNORMAL HIGH (ref 70–99)
Potassium: 3.5 mmol/L (ref 3.5–5.1)
Sodium: 140 mmol/L (ref 135–145)

## 2023-04-02 LAB — SARS CORONAVIRUS 2 BY RT PCR: SARS Coronavirus 2 by RT PCR: NEGATIVE

## 2023-04-02 LAB — PREGNANCY, URINE: Preg Test, Ur: NEGATIVE

## 2023-04-02 LAB — LIPASE, BLOOD: Lipase: 30 U/L (ref 11–51)

## 2023-04-02 MED ORDER — MAGNESIUM SULFATE 2 GM/50ML IV SOLN
2.0000 g | Freq: Once | INTRAVENOUS | Status: AC
  Administered 2023-04-02: 2 g via INTRAVENOUS
  Filled 2023-04-02: qty 50

## 2023-04-02 MED ORDER — IPRATROPIUM-ALBUTEROL 0.5-2.5 (3) MG/3ML IN SOLN
3.0000 mL | RESPIRATORY_TRACT | Status: AC | PRN
  Administered 2023-04-02 (×3): 3 mL via RESPIRATORY_TRACT
  Filled 2023-04-02: qty 6
  Filled 2023-04-02: qty 3

## 2023-04-02 MED ORDER — PREDNISONE 50 MG PO TABS: 50.0000 mg | ORAL_TABLET | Freq: Every day | ORAL | 0 refills | Status: DC

## 2023-04-02 MED ORDER — IPRATROPIUM-ALBUTEROL 0.5-2.5 (3) MG/3ML IN SOLN: 3.0000 mL | RESPIRATORY_TRACT | 0 refills | Status: DC | PRN

## 2023-04-02 MED ORDER — METHYLPREDNISOLONE SODIUM SUCC 125 MG IJ SOLR
125.0000 mg | Freq: Once | INTRAMUSCULAR | Status: AC
  Administered 2023-04-02: 125 mg via INTRAVENOUS
  Filled 2023-04-02: qty 2

## 2023-04-02 NOTE — Telephone Encounter (Signed)
Left message for patient to call back  

## 2023-04-02 NOTE — ED Provider Notes (Signed)
[facility] Provider Note   CSN: [id number] Arrival date & time: 04/02/23  1503     History Chief Complaint  Patient presents with   Shortness of Breath    HPI Holly Nielsen is a 32 y.o. female presenting for shortness of breath.  She is a 32 year old female with severe asthma.  States that she has had significant rhinorrhea and allergy symptoms this week and she believes this is triggering his asthma exacerbation.  She has used all of her home albuterol nebulizers without symptomatic control.  States she usually gets better with DuoNebs but was out of those as well.  Denies fevers chills, nausea vomiting, chest pain at this time.  Otherwise ambulatory tolerating p.o. intake..   Patient's recorded medical, surgical, social, medication list and allergies were reviewed in the Snapshot window as part of the initial history.   Review of Systems   Review of Systems  Constitutional:  Negative for chills and fever.  HENT:  Negative for ear pain and sore throat.   Eyes:  Negative for pain and visual disturbance.  Respiratory:  Positive for shortness of breath and wheezing. Negative for cough.   Cardiovascular:  Negative for chest pain and palpitations.  Gastrointestinal:  Negative for abdominal pain and vomiting.  Genitourinary:  Negative for dysuria and hematuria.  Musculoskeletal:  Negative for arthralgias and back pain.  Skin:  Negative for color change and rash.  Neurological:  Negative for seizures and syncope.  All other systems reviewed and are negative.   Physical Exam Updated Vital Signs BP (!) 141/104   Pulse 93   Temp 98.6 F (37 C) (Oral)   Resp 14   SpO2 98%  Physical Exam Vitals and nursing note reviewed.  Constitutional:      General: She is not in acute distress.    Appearance: She is well-developed.  HENT:     Head: Normocephalic and atraumatic.  Eyes:     Conjunctiva/sclera: Conjunctivae normal.   Cardiovascular:     Rate and Rhythm: Normal rate and regular rhythm.     Heart sounds: No murmur heard. Pulmonary:     Effort: Pulmonary effort is normal. Tachypnea present. No respiratory distress.     Breath sounds: Wheezing and rhonchi present.  Abdominal:     General: There is no distension.     Palpations: Abdomen is soft.     Tenderness: There is no abdominal tenderness. There is no right CVA tenderness or left CVA tenderness.  Musculoskeletal:        General: No swelling or tenderness. Normal range of motion.     Cervical back: Neck supple.  Skin:    General: Skin is warm and dry.  Neurological:     General: No focal deficit present.     Mental Status: She is alert and oriented to person, place, and time. Mental status is at baseline.     Cranial Nerves: No cranial nerve deficit.      ED Course/ Medical Decision Making/ A&P    Procedures .Critical Care  Performed by: Tretha Sciara, MD Authorized by: Tretha Sciara, MD   Critical care provider statement:    Critical care time (minutes):  30   Critical care was necessary to treat or prevent imminent or life-threatening deterioration of the following conditions:  Respiratory failure   Critical care was time spent personally by me on the following activities:  Development of treatment plan with patient or surrogate, discussions with consultants, evaluation  of patient's response to treatment, examination of patient, ordering and review of laboratory studies, ordering and review of radiographic studies, ordering and performing treatments and interventions, pulse oximetry, re-evaluation of patient's condition and review of old charts    Medications Ordered in ED Medications  ipratropium-albuterol (DUONEB) 0.5-2.5 (3) MG/3ML nebulizer solution 3 mL (3 mLs Nebulization Given 04/02/23 1654)  methylPREDNISolone sodium succinate (SOLU-MEDROL) 125 mg/2 mL injection 125 mg (125 mg Intravenous Given 04/02/23 1642)  magnesium  sulfate IVPB 2 g 50 mL (0 g Intravenous Stopped 04/02/23 1738)    Medical Decision Making:    Holly Nielsen is a 32 y.o. female who presented to the ED today with shortness of breath detailed above.     Handoff received from EMS.  Additional history discussed with patient's family/caregivers.  Patient placed on continuous vitals and telemetry monitoring while in ED which was reviewed periodically.   Complete initial physical exam performed, notably the patient  was in respiratory distress.  Substantial wheezing, only able to get out 2 or 3 words at a time.  I was called emergently to bedside to evaluate.  Patient is on 4 L nasal cannula due to saturations below 80%.  Not on oxygen at baseline therapeutic interventions for patient's wheezing were ordered stat and RT was called to bedside.      Reviewed and confirmed nursing documentation for past medical history, family history, social history.    Initial Assessment:   With the patient's presentation of shortness of breath at, most likely diagnosis is asthma exacerbation likely secondary to underlying allergy disorder versus viral syndrome. Other diagnoses were considered including (but not limited to) pneumonia, coronavirus, pneumothorax, PE, ACS. These are considered less likely due to history of present illness and physical exam findings.   This is most consistent with an acute life/limb threatening illness complicated by underlying chronic conditions.  Initial Plan:  Therapeutic intervention described above including triplicate and DuoNebs, IV magnesium 2 g, Solu-Medrol 125 Screening labs including CBC and Metabolic panel to evaluate for infectious or metabolic etiology of disease.  Viral screening to evaluate for underlying coronavirus infection Pregnancy test to evaluate for risk for for medication selection CXR to evaluate for structural/infectious intrathoracic pathology.  Objective evaluation as below reviewed with plan for close  reassessment  Initial Study Results:   Laboratory  All laboratory results reviewed without evidence of clinically relevant pathology.    Radiology  All images reviewed independently. Agree with radiology report at this time.   DG Chest Portable 1 View  Result Date: 04/02/2023 CLINICAL DATA:  Shortness of breath. EXAM: PORTABLE CHEST 1 VIEW COMPARISON:  12/25/2022. FINDINGS: Clear lungs. Normal heart size and mediastinal contours. No pleural effusion or pneumothorax. Visualized bones and upper abdomen are unremarkable. IMPRESSION: No evidence of acute cardiopulmonary disease. Electronically Signed   By: Emmit Alexanders M.D.   On: 04/02/2023 16:50     Final Assessment and Plan:   Patient observed in emergency department for 4 hours underwent 1 hour of continuous DuoNebs with gross symptomatic improvement after these therapies.  Observed off of further therapy for 2 hours in emergency room was able to come off of oxygen and had gross symptomatic improvement. Had a shared medical decision making conversation with patient at bedside.  She has only had a short observation window at this time but her pharmacy closes in a short amount of time and she knows she is going to want to be discharged tonight so she requested we send her medicines to  her pharmacy for her to pick up so that she can continue home treatment tonight and she stated she will return to the emergency room if her symptoms worsen.   Disposition:  I have considered need for hospitalization, however, considering all of the above, I believe this patient is stable for discharge at this time.  Patient/family educated about specific return precautions for given chief complaint and symptoms.  Patient/family educated about follow-up with PCP.     Patient/family expressed understanding of return precautions and need for follow-up. Patient spoken to regarding all imaging and laboratory results and appropriate follow up for these results. All  education provided in verbal form with additional information in written form. Time was allowed for answering of patient questions. Patient discharged.    Emergency Department Medication Summary:   Medications  ipratropium-albuterol (DUONEB) 0.5-2.5 (3) MG/3ML nebulizer solution 3 mL (3 mLs Nebulization Given 04/02/23 1654)  methylPREDNISolone sodium succinate (SOLU-MEDROL) 125 mg/2 mL injection 125 mg (125 mg Intravenous Given 04/02/23 1642)  magnesium sulfate IVPB 2 g 50 mL (0 g Intravenous Stopped 04/02/23 1738)         Clinical Impression:  1. SOB (shortness of breath)   2. Exacerbation of asthma, unspecified asthma severity, unspecified whether persistent      Discharge   Final Clinical Impression(s) / ED Diagnoses Final diagnoses:  SOB (shortness of breath)  Exacerbation of asthma, unspecified asthma severity, unspecified whether persistent    Rx / DC Orders ED Discharge Orders          Ordered    ipratropium-albuterol (DUONEB) 0.5-2.5 (3) MG/3ML SOLN  Every 4 hours PRN        04/02/23 1907    predniSONE (DELTASONE) 50 MG tablet  Daily        04/02/23 1907              Tretha Sciara, MD 04/02/23 1908

## 2023-04-02 NOTE — ED Triage Notes (Signed)
Pt here with reports of shob, wheezing, cough and chest tightness/congestion onset today. Pt with hx asthma. Took multiple breathing treatments without relief.

## 2023-04-02 NOTE — Telephone Encounter (Signed)
PT wanted appt today for Asthma flair. Nothing avail until tomorrow at 9:00 but she turned that down.  Pls call @ [phone]

## 2023-04-02 NOTE — ED Notes (Signed)
RT called for breathing tx. 

## 2023-04-06 ENCOUNTER — Other Ambulatory Visit: Payer: Self-pay

## 2023-04-06 MED ORDER — ALBUTEROL SULFATE (2.5 MG/3ML) 0.083% IN NEBU
3.0000 mL | INHALATION_SOLUTION | Freq: Four times a day (QID) | RESPIRATORY_TRACT | 0 refills | Status: DC | PRN
  Filled 2023-04-06 – 2023-05-12 (×2): qty 90, 8d supply, fill #0

## 2023-04-06 NOTE — Telephone Encounter (Signed)
Will refill albuterol nebulizer solution 0.083% x one only.  Per chart note from 01/12/2023:  Received message from JE. Requesting to see pt sometime in March at [facility] location. LMTCB    Patient received refill on 02/04/23 and 03/04/23. Patient does not have an upcoming appointment with Dr Everardo All.  Will have patient call the office to make OV for further refills via MyChart.

## 2023-04-07 NOTE — Telephone Encounter (Signed)
PT went to ER

## 2023-04-10 ENCOUNTER — Other Ambulatory Visit: Payer: Self-pay

## 2023-05-02 ENCOUNTER — Other Ambulatory Visit: Payer: Self-pay

## 2023-05-02 ENCOUNTER — Emergency Department (HOSPITAL_COMMUNITY)
Admission: EM | Admit: 2023-05-02 | Discharge: 2023-05-02 | Disposition: A | Discharge status: Home / Self Care | Payer: Self-pay | Attending: Emergency Medicine | Admitting: Emergency Medicine

## 2023-05-02 ENCOUNTER — Encounter (HOSPITAL_COMMUNITY): Payer: Self-pay | Admitting: Emergency Medicine

## 2023-05-02 ENCOUNTER — Other Ambulatory Visit (HOSPITAL_COMMUNITY): Payer: Self-pay

## 2023-05-02 DIAGNOSIS — J45901 Unspecified asthma with (acute) exacerbation: Secondary | ICD-10-CM | POA: Insufficient documentation

## 2023-05-02 DIAGNOSIS — J029 Acute pharyngitis, unspecified: Secondary | ICD-10-CM | POA: Insufficient documentation

## 2023-05-02 DIAGNOSIS — J069 Acute upper respiratory infection, unspecified: Secondary | ICD-10-CM | POA: Insufficient documentation

## 2023-05-02 LAB — MONONUCLEOSIS SCREEN: Mono Screen: NEGATIVE

## 2023-05-02 LAB — GROUP A STREP BY PCR: Group A Strep by PCR: NOT DETECTED

## 2023-05-02 MED ORDER — IPRATROPIUM-ALBUTEROL 0.5-2.5 (3) MG/3ML IN SOLN
3.0000 mL | Freq: Once | RESPIRATORY_TRACT | Status: AC
  Administered 2023-05-02: 3 mL via RESPIRATORY_TRACT
  Filled 2023-05-02: qty 3

## 2023-05-02 MED ORDER — PREDNISONE 20 MG PO TABS
40.0000 mg | ORAL_TABLET | Freq: Every day | ORAL | 0 refills | Status: DC
  Filled 2023-05-02: qty 10, 5d supply, fill #0

## 2023-05-02 MED ORDER — METHYLPREDNISOLONE SODIUM SUCC 125 MG IJ SOLR
125.0000 mg | Freq: Once | INTRAMUSCULAR | Status: AC
  Administered 2023-05-02: 125 mg via INTRAVENOUS
  Filled 2023-05-02: qty 2

## 2023-05-02 NOTE — ED Triage Notes (Signed)
Pt in with sore throat + runny nose and sob since yesterday. Denies fevers. Pt states mom currently has mono, and she has been around her

## 2023-05-02 NOTE — Discharge Instructions (Addendum)
I will call you if any of your tests are positive.  You can also check on your myChart account.

## 2023-05-02 NOTE — ED Provider Notes (Signed)
[facility] DEPT [facility] Emergency Department Provider Note MRN:  [id number]  Arrival date & time: 05/02/23     Chief Complaint   Sore Throat   History of Present Illness   Holly Nielsen is a 32 y.o. year-old female presents to the ED with chief complaint of sore throat.  Mother has mono.  Onset of symptoms was 2-3 days ago.  Denies fever or chills.  She has had rhinorrhea and congestion and cough.  She has hx of asthma and has chest tightness.  She has been using inhalers and duonebs without relief.  History provided by patient.   Review of Systems  Pertinent positive and negative review of systems noted in HPI.    Physical Exam   Vitals:   05/02/23 0224 05/02/23 0230  BP:  (!) 146/114  Pulse: 91 87  Resp: 17   Temp: 98 F (36.7 C)   SpO2: 98% 98%    CONSTITUTIONAL:  non toxic-appearing, NAD NEURO:  Alert and oriented x 3, CN 3-12 grossly intact EYES:  eyes equal and reactive ENT/NECK:  Supple, no stridor, mildly erythematous oropharynx without evidence of abscess CARDIO:  normal rate, regular rhythm, appears well-perfused  PULM:  No respiratory distress, diffuse end expiratory wheezes GI/GU:  non-distended,  MSK/SPINE:  No gross deformities, no edema, moves all extremities  SKIN:  no rash, atraumatic   *Additional and/or pertinent findings included in MDM below  Diagnostic and Interventional Summary    EKG Interpretation  Date/Time:    Ventricular Rate:    PR Interval:    QRS Duration:   QT Interval:    QTC Calculation:   R Axis:     Text Interpretation:         Labs Reviewed  GROUP A STREP BY PCR  RESP PANEL BY RT-PCR (RSV, FLU A&B, COVID)  [id number]  MONONUCLEOSIS SCREEN    No orders to display    Medications  methylPREDNISolone sodium succinate (SOLU-MEDROL) 125 mg/2 mL injection 125 mg (125 mg Intravenous Given 05/02/23 0357)  ipratropium-albuterol (DUONEB) 0.5-2.5 (3) MG/3ML nebulizer solution 3 mL (3 mLs Nebulization Given 05/02/23  0357)  ipratropium-albuterol (DUONEB) 0.5-2.5 (3) MG/3ML nebulizer solution 3 mL (3 mLs Nebulization Given 05/02/23 0411)     Procedures  /  Critical Care Procedures  ED Course and Medical Decision Making  I have reviewed the triage vital signs, the nursing notes, and pertinent available records from the EMR.  Social Determinants Affecting Complexity of Care: Patient has no clinically significant social determinants affecting this chief complaint..   ED Course:    Medical Decision Making Patient here with sore throat, cough, and runny nose.  Onset of symptoms was a day or so ago.  She denies any fevers.  She states that her mother has been diagnosed with mono, and she has been around her mom lately.  She also reports having asthma and having some chest tightness that she attributes to her asthma.  She has been using inhalers without much relief.  Patient feels improved after nebs and IV Solu-Medrol.  Will discharge home with prednisone.  Strep test is negative.  Mono is pending.  RVP pending.    Amount and/or Complexity of Data Reviewed Labs: ordered.    Details: Strep test negative Mono and RVP pending.  I will call and notify the patient of the results if there are any positive findings.  Risk Prescription drug management.     Consultants: No consultations were needed in caring for this patient.  Treatment and Plan: Emergency department workup does not suggest an emergent condition requiring admission or immediate intervention beyond  what has been performed at this time. The patient is safe for discharge and has  been instructed to return immediately for worsening symptoms, change in  symptoms or any other concerns    Final Clinical Impressions(s) / ED Diagnoses     ICD-10-CM   1. Pharyngitis, unspecified etiology  J02.9     2. Upper respiratory tract infection, unspecified type  J06.9     3. Exacerbation of asthma, unspecified asthma severity, unspecified whether  persistent  J45.901       ED Discharge Orders          Ordered    predniSONE (DELTASONE) 20 MG tablet  Daily        05/02/23 0442              Discharge Instructions Discussed with and Provided to Patient:     Discharge Instructions      I will call you if any of your tests are positive.  You can also check on your myChart account.       Roxy Horseman, PA-C 05/02/23 0444    Sabas Sous, MD 05/02/23 [phone]

## 2023-05-12 ENCOUNTER — Other Ambulatory Visit: Payer: Self-pay

## 2023-05-12 ENCOUNTER — Other Ambulatory Visit (HOSPITAL_COMMUNITY): Payer: Self-pay

## 2023-05-13 ENCOUNTER — Other Ambulatory Visit: Payer: Self-pay

## 2023-05-20 ENCOUNTER — Other Ambulatory Visit: Payer: Self-pay | Admitting: Pulmonary Disease

## 2023-05-20 ENCOUNTER — Other Ambulatory Visit: Payer: Self-pay

## 2023-05-20 MED ORDER — ALBUTEROL SULFATE (2.5 MG/3ML) 0.083% IN NEBU
3.0000 mL | INHALATION_SOLUTION | Freq: Four times a day (QID) | RESPIRATORY_TRACT | 0 refills | Status: DC | PRN
  Filled 2023-05-20: qty 90, 8d supply, fill #0

## 2023-05-29 ENCOUNTER — Other Ambulatory Visit: Payer: Self-pay

## 2023-05-29 ENCOUNTER — Other Ambulatory Visit: Payer: Self-pay | Admitting: Pulmonary Disease

## 2023-06-04 ENCOUNTER — Other Ambulatory Visit: Payer: Self-pay

## 2023-06-04 ENCOUNTER — Encounter: Payer: Self-pay | Admitting: Pharmacist

## 2023-06-08 ENCOUNTER — Ambulatory Visit (HOSPITAL_BASED_OUTPATIENT_CLINIC_OR_DEPARTMENT_OTHER): Payer: Self-pay | Admitting: Pulmonary Disease

## 2023-06-12 ENCOUNTER — Ambulatory Visit (HOSPITAL_BASED_OUTPATIENT_CLINIC_OR_DEPARTMENT_OTHER): Payer: Self-pay | Admitting: Pulmonary Disease

## 2023-07-30 ENCOUNTER — Emergency Department (HOSPITAL_COMMUNITY): Payer: Medicaid Other

## 2023-07-30 ENCOUNTER — Encounter (HOSPITAL_COMMUNITY): Payer: Self-pay

## 2023-07-30 ENCOUNTER — Inpatient Hospital Stay (HOSPITAL_COMMUNITY)
Admission: EM | Admit: 2023-07-30 | Discharge: 2023-08-04 | DRG: 916 | Disposition: A | Discharge status: Home / Self Care | Payer: Medicaid Other | Attending: Internal Medicine | Admitting: Internal Medicine

## 2023-07-30 DIAGNOSIS — Z91018 Allergy to other foods: Secondary | ICD-10-CM | POA: Diagnosis not present

## 2023-07-30 DIAGNOSIS — Z597 Insufficient social insurance and welfare support: Secondary | ICD-10-CM

## 2023-07-30 DIAGNOSIS — T380X5A Adverse effect of glucocorticoids and synthetic analogues, initial encounter: Secondary | ICD-10-CM | POA: Diagnosis not present

## 2023-07-30 DIAGNOSIS — X58XXXA Exposure to other specified factors, initial encounter: Secondary | ICD-10-CM | POA: Diagnosis present

## 2023-07-30 DIAGNOSIS — R233 Spontaneous ecchymoses: Secondary | ICD-10-CM | POA: Diagnosis present

## 2023-07-30 DIAGNOSIS — Z91148 Patient's other noncompliance with medication regimen for other reason: Secondary | ICD-10-CM

## 2023-07-30 DIAGNOSIS — R739 Hyperglycemia, unspecified: Secondary | ICD-10-CM | POA: Diagnosis not present

## 2023-07-30 DIAGNOSIS — R609 Edema, unspecified: Secondary | ICD-10-CM | POA: Diagnosis not present

## 2023-07-30 DIAGNOSIS — F419 Anxiety disorder, unspecified: Secondary | ICD-10-CM | POA: Diagnosis present

## 2023-07-30 DIAGNOSIS — R008 Other abnormalities of heart beat: Secondary | ICD-10-CM | POA: Diagnosis not present

## 2023-07-30 DIAGNOSIS — Z7952 Long term (current) use of systemic steroids: Secondary | ICD-10-CM

## 2023-07-30 DIAGNOSIS — Z713 Dietary counseling and surveillance: Secondary | ICD-10-CM

## 2023-07-30 DIAGNOSIS — J8283 Eosinophilic asthma: Secondary | ICD-10-CM | POA: Diagnosis present

## 2023-07-30 DIAGNOSIS — D509 Iron deficiency anemia, unspecified: Secondary | ICD-10-CM | POA: Diagnosis present

## 2023-07-30 DIAGNOSIS — Z6841 Body Mass Index (BMI) 40.0 and over, adult: Secondary | ICD-10-CM | POA: Diagnosis not present

## 2023-07-30 DIAGNOSIS — R519 Headache, unspecified: Secondary | ICD-10-CM | POA: Diagnosis not present

## 2023-07-30 DIAGNOSIS — Z7951 Long term (current) use of inhaled steroids: Secondary | ICD-10-CM

## 2023-07-30 DIAGNOSIS — T7849XA Other allergy, initial encounter: Secondary | ICD-10-CM | POA: Diagnosis not present

## 2023-07-30 DIAGNOSIS — R062 Wheezing: Secondary | ICD-10-CM | POA: Diagnosis not present

## 2023-07-30 DIAGNOSIS — E041 Nontoxic single thyroid nodule: Secondary | ICD-10-CM | POA: Diagnosis not present

## 2023-07-30 DIAGNOSIS — J4550 Severe persistent asthma, uncomplicated: Secondary | ICD-10-CM

## 2023-07-30 DIAGNOSIS — T782XXA Anaphylactic shock, unspecified, initial encounter: Secondary | ICD-10-CM | POA: Diagnosis not present

## 2023-07-30 DIAGNOSIS — T7840XA Allergy, unspecified, initial encounter: Secondary | ICD-10-CM | POA: Diagnosis not present

## 2023-07-30 DIAGNOSIS — R000 Tachycardia, unspecified: Secondary | ICD-10-CM | POA: Diagnosis not present

## 2023-07-30 DIAGNOSIS — Z888 Allergy status to other drugs, medicaments and biological substances status: Secondary | ICD-10-CM

## 2023-07-30 DIAGNOSIS — H05223 Edema of bilateral orbit: Secondary | ICD-10-CM | POA: Diagnosis not present

## 2023-07-30 DIAGNOSIS — J45909 Unspecified asthma, uncomplicated: Secondary | ICD-10-CM | POA: Diagnosis not present

## 2023-07-30 DIAGNOSIS — R231 Pallor: Secondary | ICD-10-CM | POA: Diagnosis not present

## 2023-07-30 DIAGNOSIS — F32A Depression, unspecified: Secondary | ICD-10-CM | POA: Diagnosis present

## 2023-07-30 DIAGNOSIS — T782XXD Anaphylactic shock, unspecified, subsequent encounter: Secondary | ICD-10-CM | POA: Diagnosis not present

## 2023-07-30 DIAGNOSIS — R600 Localized edema: Secondary | ICD-10-CM

## 2023-07-30 DIAGNOSIS — R0600 Dyspnea, unspecified: Secondary | ICD-10-CM | POA: Diagnosis present

## 2023-07-30 DIAGNOSIS — R0689 Other abnormalities of breathing: Secondary | ICD-10-CM | POA: Diagnosis not present

## 2023-07-30 HISTORY — DX: Anaphylactic shock, unspecified, initial encounter: T78.2XXA

## 2023-07-30 LAB — CBC WITH DIFFERENTIAL/PLATELET
Abs Immature Granulocytes: 0.03 10*3/uL (ref 0.00–0.07)
Basophils Absolute: 0 10*3/uL (ref 0.0–0.1)
Basophils Relative: 0 %
Eosinophils Absolute: 0.3 10*3/uL (ref 0.0–0.5)
Eosinophils Relative: 4 %
HCT: 41.2 % (ref 36.0–46.0)
Hemoglobin: 12.8 g/dL (ref 12.0–15.0)
Immature Granulocytes: 0 %
Lymphocytes Relative: 27 %
Lymphs Abs: 2.2 10*3/uL (ref 0.7–4.0)
MCH: 25.2 pg — ABNORMAL LOW (ref 26.0–34.0)
MCHC: 31.1 g/dL (ref 30.0–36.0)
MCV: 81.1 fL (ref 80.0–100.0)
Monocytes Absolute: 0.5 10*3/uL (ref 0.1–1.0)
Monocytes Relative: 6 %
Neutro Abs: 5 10*3/uL (ref 1.7–7.7)
Neutrophils Relative %: 63 %
Platelets: 229 10*3/uL (ref 150–400)
RBC: 5.08 MIL/uL (ref 3.87–5.11)
RDW: 14.1 % (ref 11.5–15.5)
WBC: 8 10*3/uL (ref 4.0–10.5)
nRBC: 0 % (ref 0.0–0.2)

## 2023-07-30 LAB — BASIC METABOLIC PANEL
Anion gap: 11 (ref 5–15)
BUN: 12 mg/dL (ref 6–20)
CO2: 24 mmol/L (ref 22–32)
Calcium: 8.5 mg/dL — ABNORMAL LOW (ref 8.9–10.3)
Chloride: 103 mmol/L (ref 98–111)
Creatinine, Ser: 0.93 mg/dL (ref 0.44–1.00)
GFR, Estimated: >60 mL/min (ref >60)
Glucose, Bld: 121 mg/dL — ABNORMAL HIGH (ref 70–99)
Potassium: 3.6 mmol/L (ref 3.5–5.1)
Sodium: 138 mmol/L (ref 135–145)

## 2023-07-30 LAB — HCG, SERUM, QUALITATIVE: Preg, Serum: NEGATIVE

## 2023-07-30 LAB — GLUCOSE, CAPILLARY: Glucose-Capillary: 204 mg/dL — ABNORMAL HIGH (ref 70–99)

## 2023-07-30 MED ORDER — ALBUTEROL SULFATE (2.5 MG/3ML) 0.083% IN NEBU
2.5000 mg | INHALATION_SOLUTION | RESPIRATORY_TRACT | Status: DC | PRN
  Administered 2023-07-31: 2.5 mg via RESPIRATORY_TRACT
  Filled 2023-07-30: qty 3

## 2023-07-30 MED ORDER — DOCUSATE SODIUM 100 MG PO CAPS: 100.0000 mg | ORAL_CAPSULE | Freq: Two times a day (BID) | ORAL | Status: DC | PRN

## 2023-07-30 MED ORDER — METHYLPREDNISOLONE SODIUM SUCC 125 MG IJ SOLR
125.0000 mg | Freq: Once | INTRAMUSCULAR | Status: AC
  Administered 2023-07-30: 125 mg via INTRAVENOUS
  Filled 2023-07-30: qty 2

## 2023-07-30 MED ORDER — BUDESONIDE 0.5 MG/2ML IN SUSP
0.5000 mg | Freq: Two times a day (BID) | RESPIRATORY_TRACT | Status: DC
  Administered 2023-07-30 – 2023-08-04 (×10): 0.5 mg via RESPIRATORY_TRACT
  Filled 2023-07-30 (×10): qty 2

## 2023-07-30 MED ORDER — CHLORHEXIDINE GLUCONATE CLOTH 2 % EX PADS
6.0000 | MEDICATED_PAD | Freq: Every day | CUTANEOUS | Status: DC
  Administered 2023-07-31 – 2023-08-02 (×4): 6 via TOPICAL

## 2023-07-30 MED ORDER — SODIUM CHLORIDE 0.9 % IV BOLUS
1000.0000 mL | Freq: Once | INTRAVENOUS | Status: AC
  Administered 2023-07-30: 1000 mL via INTRAVENOUS

## 2023-07-30 MED ORDER — MAGNESIUM SULFATE 2 GM/50ML IV SOLN
2.0000 g | Freq: Once | INTRAVENOUS | Status: AC
  Administered 2023-07-30: 2 g via INTRAVENOUS
  Filled 2023-07-30: qty 50

## 2023-07-30 MED ORDER — FAMOTIDINE IN NACL 20-0.9 MG/50ML-% IV SOLN
20.0000 mg | Freq: Once | INTRAVENOUS | Status: AC
  Administered 2023-07-30: 20 mg via INTRAVENOUS
  Filled 2023-07-30: qty 50

## 2023-07-30 MED ORDER — MONTELUKAST SODIUM 10 MG PO TABS
10.0000 mg | ORAL_TABLET | Freq: Every day | ORAL | Status: DC
  Administered 2023-07-30 – 2023-08-03 (×5): 10 mg via ORAL
  Filled 2023-07-30 (×5): qty 1

## 2023-07-30 MED ORDER — ALBUTEROL SULFATE (2.5 MG/3ML) 0.083% IN NEBU
15.0000 mg/h | INHALATION_SOLUTION | RESPIRATORY_TRACT | Status: AC
  Administered 2023-07-30: 15 mg/h via RESPIRATORY_TRACT
  Filled 2023-07-30: qty 18

## 2023-07-30 MED ORDER — HEPARIN SODIUM (PORCINE) 5000 UNIT/ML IJ SOLN
5000.0000 [IU] | Freq: Three times a day (TID) | INTRAMUSCULAR | Status: DC
  Administered 2023-07-30 – 2023-07-31 (×3): 5000 [IU] via SUBCUTANEOUS
  Filled 2023-07-30 (×5): qty 1

## 2023-07-30 MED ORDER — ARFORMOTEROL TARTRATE 15 MCG/2ML IN NEBU
15.0000 ug | INHALATION_SOLUTION | Freq: Two times a day (BID) | RESPIRATORY_TRACT | Status: DC
  Administered 2023-07-30 – 2023-08-04 (×10): 15 ug via RESPIRATORY_TRACT
  Filled 2023-07-30 (×11): qty 2

## 2023-07-30 MED ORDER — FAMOTIDINE IN NACL 20-0.9 MG/50ML-% IV SOLN
20.0000 mg | INTRAVENOUS | Status: DC
  Administered 2023-07-31: 20 mg via INTRAVENOUS
  Filled 2023-07-30: qty 50

## 2023-07-30 MED ORDER — EPINEPHRINE HCL 5 MG/250ML IV SOLN IN NS
1.0000 ug/min | INTRAVENOUS | Status: DC
  Administered 2023-07-30: 0.5 ug/min via INTRAVENOUS
  Filled 2023-07-30: qty 250

## 2023-07-30 MED ORDER — EPINEPHRINE HCL 5 MG/250ML IV SOLN IN NS
0.5000 ug/min | INTRAVENOUS | Status: DC
  Administered 2023-07-30: 5 ug/min via INTRAVENOUS

## 2023-07-30 MED ORDER — DIPHENHYDRAMINE HCL 50 MG/ML IJ SOLN
50.0000 mg | Freq: Four times a day (QID) | INTRAMUSCULAR | Status: AC
  Administered 2023-07-31 (×4): 50 mg via INTRAVENOUS
  Filled 2023-07-30 (×4): qty 1

## 2023-07-30 MED ORDER — SODIUM CHLORIDE 0.9 % IV SOLN: INTRAVENOUS | Status: DC

## 2023-07-30 MED ORDER — POLYETHYLENE GLYCOL 3350 17 G PO PACK: 17.0000 g | PACK | Freq: Every day | ORAL | Status: DC | PRN

## 2023-07-30 NOTE — ED Notes (Signed)
Pt stated throat and eyes began to feel itchy again after epi drip was dc'ed. Provider instructed me to start it again at 5 mcg.

## 2023-07-30 NOTE — H&P (Signed)
NAME:  Holly Nielsen, MRN:  [id number], DOB:  21-Feb-1991, LOS: 0 ADMISSION DATE:  07/30/2023, CONSULTATION DATE:  07/30/23 REFERRING MD:  Doran Durand CHIEF COMPLAINT:  Anaphylaxis   History of Present Illness:  Holly Nielsen is a 32 y.o. female who has a PMH as below including but not limited to Asthma, multiple allergies. She was in her usual state of health 8/1 until around 1800 when she developed eye itching, dyspnea, wheezing. She tried to go get her epi pen but was unable to do so because of dyspnea so called 911 and her boyfriend who works across the street. Boyfriend arrived and administered epi pen as fire department arrived. She then received an additional epi pen along with Benadryl, Solumedrol, and two nebulizer treatments. She had some improvement but remained dyspneic. In ED, she was started on Epinephrine infusion which helped significantly. This was held briefly, but wheezing then recurred; therefore, epi was restarted.  She denies any new foods or medications. States she ate [place] food around 1300 but this was leftovers from the day prior which she tolerated fine. She is not on an ACE. She states she has never had allergic symptoms this severe before. She has seen an allergist in the past and was told that she was allergic to everything except [place]. She used to be on Xolair but is no longer on this due to lack of insurance coverage.  She is currently on 5 Epi and is stable. She is able to speak in full sentences without difficulty. She has no wheezing. She does have obvious periorbital edema which she feels has improved somewhat.  Pertinent  Medical History:  has Morbid obesity with body mass index of 40.0-49.9 (HCC); ADD (attention deficit disorder); Amenorrhea; Anxiety and depression; Cyst of thyroid; Guillain-Barre syndrome (HCC); H/O removal of thyroglossal duct cyst; History of iron deficiency anemia; Pica in adults; Unspecified asthma with (acute) exacerbation; Severe  persistent asthma with acute bronchitis and acute exacerbation; URI (upper respiratory infection); and Anaphylaxis on their problem list.  Significant Hospital Events: Including procedures, antibiotic start and stop dates in addition to other pertinent events   8/1 admit.  Interim History / Subjective:  Comfortable, denies dyspnea/wheezing on 5 Epi.  Objective:  Blood pressure (!) 141/88, pulse (!) 110, temperature 98.1 F (36.7 C), resp. rate 19, SpO2 94%.       No intake or output data in the 24 hours ending 07/30/23 2238 There were no vitals filed for this visit.  Examination: General: Young adult female, resting in bed, in NAD. Neuro: A&O x 3, no deficits. HEENT: Periorbital edema. [place]/AT. Sclerae anicteric. EOMI. Cardiovascular: RRR, no M/R/G.  Lungs: Respirations even and unlabored.  CTA bilaterally, No W/R/R. Abdomen: BS x 4, soft, NT/ND.  Musculoskeletal: No gross deformities, no edema.  Skin: Intact, warm, no rashes.   Assessment & Plan:   Anaphylaxis - unclear etiology thus far. Improved on Epinephrine infusion. - Continue Epi gtt, wean as able. - Continue scheduled Benadryl and Pepcid. - F/u with allergist as outpatient.  Hx Asthma. - Continue home Singulair. - Budesonide/Brovana in lieu of home Airduo. - PRN Albuterol.   Best practice (evaluated daily):  Diet/type: NPO - sips and ice chips OK for now DVT prophylaxis: prophylactic heparin  GI prophylaxis: H2B Lines: N/A Foley:  N/A Code Status:  full code Last date of multidisciplinary goals of care discussion: None yet.  Labs   CBC: Recent Labs  Lab 07/30/23 1946  WBC 8.0  NEUTROABS 5.0  HGB 12.8  HCT 41.2  MCV 81.1  PLT 229    Basic Metabolic Panel: Recent Labs  Lab 07/30/23 1946  NA 138  K 3.6  CL 103  CO2 24  GLUCOSE 121*  BUN 12  CREATININE 0.93  CALCIUM 8.5*   GFR: CrCl cannot be calculated (Unknown ideal weight.). Recent Labs  Lab 07/30/23 1946  WBC 8.0    Liver  Function Tests: No results for input(s): "AST", "ALT", "ALKPHOS", "BILITOT", "PROT", "ALBUMIN" in the last 168 hours. No results for input(s): "LIPASE", "AMYLASE" in the last 168 hours. No results for input(s): "AMMONIA" in the last 168 hours.  ABG    Component Value Date/Time   HCO3 30.3 (H) 03/13/2022 1840   O2SAT 79.7 03/13/2022 1840     Coagulation Profile: No results for input(s): "INR", "PROTIME" in the last 168 hours.  Cardiac Enzymes: No results for input(s): "CKTOTAL", "CKMB", "CKMBINDEX", "TROPONINI" in the last 168 hours.  HbA1C: No results found for: "HGBA1C"  CBG: No results for input(s): "GLUCAP" in the last 168 hours.  Review of Systems:   All negative; except for those that are bolded, which indicate positives.  Constitutional: weight loss, weight gain, night sweats, fevers, chills, fatigue, weakness.  HEENT: headaches, sore throat, sneezing, nasal congestion, post nasal drip, difficulty swallowing, tooth/dental problems, visual complaints, visual changes, ear aches, periorbital edema. Neuro: difficulty with speech, weakness, numbness, ataxia. CV:  chest pain, orthopnea, PND, swelling in lower extremities, dizziness, palpitations, syncope.  Resp: cough, hemoptysis, dyspnea, wheezing. GI: heartburn, indigestion, abdominal pain, nausea, vomiting, diarrhea, constipation, change in bowel habits, loss of appetite, hematemesis, melena, hematochezia.  GU: dysuria, change in color of urine, urgency or frequency, flank pain, hematuria. MSK: joint pain or swelling, decreased range of motion. Psych: change in mood or affect, depression, anxiety, suicidal ideations, homicidal ideations. Skin: rash, itching, bruising.   Past Medical History:  She,  has a past medical history of Asthma, Guillain-Barre (HCC), Iron deficiency anemia, and Pica.   Surgical History:   Past Surgical History:  Procedure Laterality Date   APPENDECTOMY     CHOLECYSTECTOMY     thyroid cyst  removal     TONSILLECTOMY     WISDOM TOOTH EXTRACTION       Social History:   reports that she has never smoked. She has never used smokeless tobacco. She reports that she does not drink alcohol and does not use drugs.   Family History:  Her family history includes Asthma in her father; Diabetes Mellitus I in her paternal grandmother; Stroke in her paternal grandfather.   Allergies Allergies  Allergen Reactions   Capsicum Anaphylaxis   Buspirone Diarrhea   Other Nausea And Vomiting and Diarrhea    Green bell peppers   Sweet Corn Extract Skin Test Diarrhea   Actical Diarrhea   Celery Oil Diarrhea   Corn Oil Diarrhea    rash   Iron Sucrose     Other reaction(s): Dizziness, Other (See Comments), Other - See Comments Pt became flushed, hot and diaphoretic after completion of infusion.  Pt became flushed, hot and diaphoretic after completion of infusion.     Milk (Cow) Diarrhea   Nutricap [Actical] Diarrhea   Onion Diarrhea   Pedi-Pre Tape Spray [Wound Dressing Adhesive] Hives   Vanilla Diarrhea     Home Medications  Prior to Admission medications   Medication Sig Start Date End Date Taking? Authorizing Provider  albuterol (PROVENTIL) (2.5 MG/3ML) 0.083% nebulizer solution Take 3 mLs by nebulization every  6 (six) hours as needed for wheezing. Contact Doctor for more refills. Patient taking differently: Take 3 mLs by nebulization every 6 (six) hours as needed for wheezing or shortness of breath. 05/20/23  Yes Luciano Cutter, MD  albuterol (VENTOLIN HFA) 108 (90 Base) MCG/ACT inhaler Inhale 2 puffs into the lungs every 6 (six) hours as needed for wheezing. 12/24/22  Yes Varney Daily, PA  cetirizine (ZYRTEC) 10 MG tablet Take 10 mg by mouth daily.   Yes [provider]  EPINEPHrine 0.3 mg/0.3 mL IJ SOAJ injection Inject 0.3 mg into the muscle as needed for anaphylaxis. Patient taking differently: Inject 0.3 mg into the muscle once as needed for anaphylaxis.  06/24/22  Yes Luciano Cutter, MD  Fluticasone-Salmeterol (AIRDUO RESPICLICK 232/14) 232-14 MCG/ACT AEPB Inhale 1 puff into the lungs 2 (two) times daily. 12/01/22  Yes Luciano Cutter, MD  ipratropium-albuterol (DUONEB) 0.5-2.5 (3) MG/3ML SOLN Take 3 mLs by nebulization every 4 (four) hours as needed. 04/02/23  Yes Glyn Ade, MD  PARoxetine (PAXIL-CR) 12.5 MG 24 hr tablet Take 12.5 mg by mouth every morning. 07/08/21  Yes [provider]  benzonatate (TESSALON) 200 MG capsule Take 1 capsule (200 mg total) by mouth 3 (three) times a day if needed for cough for up to 7 days. Do not crush or chew. Patient not taking: Reported on 12/25/2022 06/17/22     meloxicam (MOBIC) 15 MG tablet Take 1 tablet (15 mg total) by mouth daily with a meal. Patient not taking: Reported on 12/25/2022 12/03/22     methocarbamol (ROBAXIN) 500 MG tablet Take 1 tablet (500 mg total) by mouth at bedtime. Patient not taking: Reported on 07/30/2023 12/03/22     montelukast (SINGULAIR) 10 MG tablet Take 1 tablet (10 mg total) by mouth at bedtime. Patient not taking: Reported on 07/30/2023 04/25/22   Luciano Cutter, MD  omalizumab Geoffry [place]) 150 MG/ML prefilled syringe Inject 300 mg into the skin every 28 (twenty-eight) days. Patient not taking: Reported on 07/30/2023 12/30/22   Luciano Cutter, MD  ondansetron (ZOFRAN-ODT) 4 MG disintegrating tablet Take 1 tablet (4 mg total) by mouth every 8 (eight) hours as needed for nausea or vomiting. Patient not taking: Reported on 07/30/2023 08/29/22   Jacalyn Lefevre, MD  predniSONE (DELTASONE) 20 MG tablet Take 2 tablets (40 mg total) by mouth daily. Patient not taking: Reported on 07/30/2023 05/02/23   Roxy Horseman, PA-C     Critical care time: 40 min.   Rutherford Guys, PA - [facility] For pager details, please see AMION or use Epic chat  After 1900, please call [facility] for cross coverage needs 07/30/2023, 10:38 PM

## 2023-07-30 NOTE — ED Provider Notes (Addendum)
[place] EMERGENCY DEPARTMENT AT [facility] Provider Note   CSN: [id number] Arrival date & time: 07/30/23  1916     History  Chief Complaint  Patient presents with  . Allergic Reaction    Holly Nielsen is a 32 y.o. female with PMH asthma, iron deficiency anemia, frequent allergic reactions who presents to ED for allergic reaction. Notes she was in her baseline state of health when she suddenly began to feel that she was itching and developed swelling to her face as well as difficulty swallowing.  Did not develop any visible rash.  Did start to feel short of breath with wheezing.  Called EMS for treatment following self administration of an EpiPen at home.  On the way to the ED, patient received an additional dose of epi, 125 mg Solu-Medrol, 50 mg of IV Benadryl as well as DuoNeb treatment x 2.  She did have some improvement in wheezing but continued to complain of difficulty swallowing with obvious facial swelling.  States that that she has had frequent allergic reactions before they have never been this severe or required hospitalization.  No new products, no new foods though she did have [place] food in the last 24 hours and is unsure if this caused her reaction.  No new medications.  Is not on any ACE inhibitor's or ARB's.  Denies chance of pregnancy.  No nausea, vomiting, diarrhea, or abdominal pain.     Home Medications Prior to Admission medications   Medication Sig Start Date End Date Taking? Authorizing Provider  albuterol (PROVENTIL) (2.5 MG/3ML) 0.083% nebulizer solution Take 3 mLs by nebulization every 6 (six) hours as needed for wheezing. Contact Doctor for more refills. Patient taking differently: Take 3 mLs by nebulization every 6 (six) hours as needed for wheezing or shortness of breath. 05/20/23  Yes Luciano Cutter, MD  albuterol (VENTOLIN HFA) 108 (90 Base) MCG/ACT inhaler Inhale 2 puffs into the lungs every 6 (six) hours as needed for wheezing. 12/24/22  Yes  Varney Daily, PA  cetirizine (ZYRTEC) 10 MG tablet Take 10 mg by mouth daily.   Yes [provider]  EPINEPHrine 0.3 mg/0.3 mL IJ SOAJ injection Inject 0.3 mg into the muscle as needed for anaphylaxis. Patient taking differently: Inject 0.3 mg into the muscle once as needed for anaphylaxis. 06/24/22  Yes Luciano Cutter, MD  Fluticasone-Salmeterol (AIRDUO RESPICLICK 232/14) 232-14 MCG/ACT AEPB Inhale 1 puff into the lungs 2 (two) times daily. 12/01/22  Yes Luciano Cutter, MD  ipratropium-albuterol (DUONEB) 0.5-2.5 (3) MG/3ML SOLN Take 3 mLs by nebulization every 4 (four) hours as needed. 04/02/23  Yes Glyn Ade, MD  PARoxetine (PAXIL-CR) 12.5 MG 24 hr tablet Take 12.5 mg by mouth every morning. 07/08/21  Yes [provider]  benzonatate (TESSALON) 200 MG capsule Take 1 capsule (200 mg total) by mouth 3 (three) times a day if needed for cough for up to 7 days. Do not crush or chew. Patient not taking: Reported on 12/25/2022 06/17/22     meloxicam (MOBIC) 15 MG tablet Take 1 tablet (15 mg total) by mouth daily with a meal. Patient not taking: Reported on 12/25/2022 12/03/22     methocarbamol (ROBAXIN) 500 MG tablet Take 1 tablet (500 mg total) by mouth at bedtime. Patient not taking: Reported on 07/30/2023 12/03/22     montelukast (SINGULAIR) 10 MG tablet Take 1 tablet (10 mg total) by mouth at bedtime. Patient not taking: Reported on 07/30/2023 04/25/22   Luciano Cutter,  MD  omalizumab Geoffry Paradise) 150 MG/ML prefilled syringe Inject 300 mg into the skin every 28 (twenty-eight) days. Patient not taking: Reported on 07/30/2023 12/30/22   Luciano Cutter, MD  ondansetron (ZOFRAN-ODT) 4 MG disintegrating tablet Take 1 tablet (4 mg total) by mouth every 8 (eight) hours as needed for nausea or vomiting. Patient not taking: Reported on 07/30/2023 08/29/22   Jacalyn Lefevre, MD  predniSONE (DELTASONE) 20 MG tablet Take 2 tablets (40 mg total) by mouth daily. Patient not taking: Reported  on 07/30/2023 05/02/23   Roxy Horseman, PA-C      Allergies    Capsicum, Buspirone, Other, Sweet corn extract skin test, Actical, Celery oil, Corn oil, Iron sucrose, Milk (cow), Nutricap [actical], Onion, Pedi-pre tape spray [wound dressing adhesive], and Vanilla    Review of Systems   Review of Systems  All other systems reviewed and are negative.   Physical Exam Updated Vital Signs BP 129/65   Pulse (!) 102   Temp 98.1 F (36.7 C)   Resp 20   SpO2 100%  Physical Exam Vitals and nursing note reviewed.  Constitutional:      General: She is in acute distress.     Appearance: Normal appearance. She is diaphoretic (slightly).  HENT:     Head:     Jaw: There is normal jaw occlusion.     Comments: Periorbital edema bilaterally    Mouth/Throat:     Mouth: No angioedema.     Comments: No signs of airway edema, does have a few palatal petechiae, tolerating secretions and speaking in short phrases though does easily become dyspneic Eyes:     Conjunctiva/sclera: Conjunctivae normal.  Cardiovascular:     Rate and Rhythm: Regular rhythm. Tachycardia present.     Heart sounds: No murmur heard. Pulmonary:     Effort: Respiratory distress present.     Breath sounds: Wheezing (inspiratory and expiratory, moderate, diffuse) present.     Comments: Mild tachypnea Abdominal:     General: Abdomen is flat. There is no distension.     Palpations: Abdomen is soft.     Tenderness: There is no abdominal tenderness. There is no guarding or rebound.  Musculoskeletal:        General: Normal range of motion.     Cervical back: Neck supple.     Right lower leg: No edema.     Left lower leg: No edema.  Skin:    General: Skin is warm.     Capillary Refill: Capillary refill takes less than 2 seconds.     Findings: No rash (to skin).  Neurological:     General: No focal deficit present.     Mental Status: She is alert and oriented to person, place, and time.  Psychiatric:        Behavior:  Behavior is cooperative.    ED Results / Procedures / Treatments   Labs (all labs ordered are listed, but only abnormal results are displayed) Labs Reviewed  BASIC METABOLIC PANEL - Abnormal; Notable for the following components:      Result Value   Glucose, Bld 121 (*)    Calcium 8.5 (*)    All other components within normal limits  CBC WITH DIFFERENTIAL/PLATELET - Abnormal; Notable for the following components:   MCH 25.2 (*)    All other components within normal limits  HCG, SERUM, QUALITATIVE    EKG EKG Interpretation Date/Time:  Thursday July 30 2023 19:23:19 EDT Ventricular Rate:  102 PR Interval:  141  QRS Duration:  104 QT Interval:  397 QTC Calculation: 518 R Axis:   58  Text Interpretation: Sinus tachycardia Atrial premature complex Low voltage, precordial leads Borderline repolarization abnormality Prolonged QT interval Confirmed by Glyn Ade [phone]) on 07/30/2023 9:43:43 PM  Radiology DG Chest Port 1 View  Result Date: 07/30/2023 CLINICAL DATA:  Allergic reaction EXAM: PORTABLE CHEST 1 VIEW COMPARISON:  04/02/2023 FINDINGS: The heart size and mediastinal contours are within normal limits. Both lungs are clear. The visualized skeletal structures are unremarkable. IMPRESSION: No active disease. Electronically Signed   By: Jasmine Pang M.D.   On: 07/30/2023 19:37    Procedures .Critical Care  Performed by: Tonette Lederer, PA-C Authorized by: Tonette Lederer, PA-C   Critical care provider statement:    Critical care time (minutes):  75   Critical care start time:  07/30/2023 7:18 PM   Critical care end time:  07/30/2023 10:00 PM   Critical care time was exclusive of:  Separately billable procedures and treating other patients and teaching time   Critical care was necessary to treat or prevent imminent or life-threatening deterioration of the following conditions:  Cardiac failure, renal failure, sepsis, dehydration, shock, circulatory failure, CNS failure or  compromise and respiratory failure   Critical care was time spent personally by me on the following activities:  Blood draw for specimens, development of treatment plan with patient or surrogate, ordering and performing treatments and interventions, ordering and review of laboratory studies, pulse oximetry, ordering and review of radiographic studies, re-evaluation of patient's condition, review of old charts, obtaining history from patient or surrogate, evaluation of patient's response to treatment, examination of patient and discussions with consultants   Care discussed with: admitting provider       Medications Ordered in ED Medications  sodium chloride 0.9 % bolus 1,000 mL (0 mLs Intravenous Stopped 07/30/23 2120)    And  0.9 %  sodium chloride infusion ( Intravenous New Bag/Given 07/30/23 2124)  albuterol (PROVENTIL) (2.5 MG/3ML) 0.083% nebulizer solution (15 mg/hr Nebulization New Bag/Given 07/30/23 2009)  methylPREDNISolone sodium succinate (SOLU-MEDROL) 125 mg/2 mL injection 125 mg (125 mg Intravenous Given 07/30/23 1942)  famotidine (PEPCID) IVPB 20 mg premix (0 mg Intravenous Stopped 07/30/23 2020)  magnesium sulfate IVPB 2 g 50 mL (0 g Intravenous Stopped 07/30/23 2120)    ED Course/ Medical Decision Making/ A&P Clinical Course as of 07/30/23 2158  Thu Jul 30, 2023  1940 Remains with persistent periorbital edema but airway remains patent. Wheezing still present but improving.  [MG]  2025 Wheezing has resolved.  No more tachypnea.  Patient reports subjective relief of dyspnea.  Airway remains patent.  Currently on epi drip at 2 mcg per minute.  Consult to intensivist pending. [MG]  2144 Patient treated with epi infusion x 2 hours.  Intensivist was consulted and recommended discontinuation of epinephrine drip and hospitalist admission. [CC]    Clinical Course User Index [CC] Glyn Ade, MD [MG] Tonette Lederer, PA-C                                 Medical Decision Making Amount  and/or Complexity of Data Reviewed Labs: ordered. Decision-making details documented in ED Course. Radiology: ordered. Decision-making details documented in ED Course. ECG/medicine tests: ordered. Decision-making details documented in ED Course.  Risk Prescription drug management. Decision regarding hospitalization.   Medical Decision Making:   Holly Nielsen is a 32 y.o. female  who presented to the ED today with allergic reaction detailed above.    Patient's presentation is complicated by their history of several allergies.  Complete initial physical exam performed, notably the patient was in acute distress secondary to dyspnea.  She was wheezing diffusely with tachypnea.  Mildly tachycardic with a regular rhythm.  She had notable periorbital edema bilaterally and petechiae to the palate but no airway edema and was tolerating secretions and speaking in short phrases.  No hives noted.  No vomiting.  Abdomen soft and nontender. Reviewed and confirmed nursing documentation for past medical history, family history, social history.    Initial Assessment:   With the patient's presentation, differential diagnosis includes but is not limited to allergic reaction, anaphylaxis, medication reaction, food allergy, angioedema, asthma exacerbation, infectious process. This is most consistent with an acute complicated illness  Initial Plan:  Screening labs including CBC and Metabolic panel to evaluate for infectious or metabolic etiology of disease.  CXR to evaluate for structural/infectious intrathoracic pathology.  EKG to evaluate for cardiac pathology Symptomatic treatment Objective evaluation as below reviewed   Initial Study Results:   Laboratory  All laboratory results reviewed without evidence of clinically relevant pathology.    EKG EKG was reviewed independently.  Sinus tach with PACs.  No acute ST-T changes.  No STEMI.  Radiology:  All images reviewed independently. Agree with  radiology report at this time.   DG Chest Port 1 View  Result Date: 07/30/2023 CLINICAL DATA:  Allergic reaction EXAM: PORTABLE CHEST 1 VIEW COMPARISON:  04/02/2023 FINDINGS: The heart size and mediastinal contours are within normal limits. Both lungs are clear. The visualized skeletal structures are unremarkable. IMPRESSION: No active disease. Electronically Signed   By: Jasmine Pang M.D.   On: 07/30/2023 19:37      Consults: Case discussed with ICU, Dr. Tonia Brooms with intensivist who recommended discontinuing epi drip.   Shortly after being taken off, pt will wheezing, tachypnea, c/o itching to throat again. Placed back on epi drip at 5 mcg.   Final Assessment and Plan:   32 year old female presents to the ED with allergic reaction.  Unknown what she reacted to.  Question if MSG given recent [place] food.  Given EpiPen x 2, 1 personal and 1 by EMS before arrival in addition to steroids, breathing treatments, and 50 mg of IV Benadryl.  Initially with tachypnea and diffuse wheezing.  Bilateral periorbital facial swelling.  No airway edema.  No hives.  Does have petechia to the palate.  Given additional dose of steroids, Pepcid, fluids, started on epi drip.  Did have significant improvement in wheezing with this but edema persisted.  Chest x-ray normal.  EKG sinus tach without acute ST-T changes.  Labs unremarkable.  As patient required epi drip following unsuccessful EpiPen x 2 and other medications as above with persistent wheezing and facial edema, discussed with intensivist who recommended stopping the epi drip and reassessing.    After being taken off epi drip, pt with onset of diffuse inspiratory and expiratory wheezing, tachypnea again. Airway remains patent without visualized edema. C/o itching sensation to throat. Placed back on epi drip at 5 mcg.  Discussed again with intensivist who will consult and admit.   Clinical Impression:  1. Allergic reaction, initial encounter   2. Wheezing   3.  Periorbital edema      Admit           Final Clinical Impression(s) / ED Diagnoses Final diagnoses:  Allergic reaction, initial encounter  Wheezing  Periorbital edema    Rx / DC Orders ED Discharge Orders     None         Tonette Lederer, PA-C 07/30/23 2201    Richardson Dopp 07/30/23 2215    Glyn Ade, MD 07/30/23 2242

## 2023-07-30 NOTE — ED Triage Notes (Signed)
Pt with hx of lots allergies comes in after having to use her own epi pen IM. Patient was given another IM epi by EMS. She also got 50 mg benadryl IV, 25 mg solumedrol IV , and two nebulizer treatments. Pt is alert, oriented, and able to move to stretcher.

## 2023-07-31 DIAGNOSIS — T7840XA Allergy, unspecified, initial encounter: Secondary | ICD-10-CM

## 2023-07-31 DIAGNOSIS — J45909 Unspecified asthma, uncomplicated: Secondary | ICD-10-CM | POA: Diagnosis not present

## 2023-07-31 DIAGNOSIS — T782XXA Anaphylactic shock, unspecified, initial encounter: Secondary | ICD-10-CM | POA: Diagnosis not present

## 2023-07-31 HISTORY — DX: Allergy, unspecified, initial encounter: T78.40XA

## 2023-07-31 LAB — GLUCOSE, CAPILLARY
Glucose-Capillary: 223 mg/dL — ABNORMAL HIGH (ref 70–99)
Glucose-Capillary: 196 mg/dL — ABNORMAL HIGH (ref 70–99)
Glucose-Capillary: 151 mg/dL — ABNORMAL HIGH (ref 70–99)
Glucose-Capillary: 132 mg/dL — ABNORMAL HIGH (ref 70–99)
Glucose-Capillary: 169 mg/dL — ABNORMAL HIGH (ref 70–99)
Glucose-Capillary: 148 mg/dL — ABNORMAL HIGH (ref 70–99)

## 2023-07-31 LAB — HEMOGLOBIN A1C
Hgb A1c MFr Bld: 5.8 % — ABNORMAL HIGH (ref 4.8–5.6)
Mean Plasma Glucose: 119.76 mg/dL

## 2023-07-31 LAB — HIV ANTIBODY (ROUTINE TESTING W REFLEX): HIV Screen 4th Generation wRfx: NONREACTIVE

## 2023-07-31 MED ORDER — POTASSIUM PHOSPHATES 15 MMOLE/5ML IV SOLN
15.0000 mmol | Freq: Once | INTRAVENOUS | Status: AC
  Administered 2023-07-31: 15 mmol via INTRAVENOUS
  Filled 2023-07-31: qty 5

## 2023-07-31 MED ORDER — METHYLPREDNISOLONE SODIUM SUCC 40 MG IJ SOLR
40.0000 mg | Freq: Once | INTRAMUSCULAR | Status: AC
  Administered 2023-07-31: 40 mg via INTRAVENOUS
  Filled 2023-07-31: qty 1

## 2023-07-31 MED ORDER — ONDANSETRON 4 MG PO TBDP: 4.0000 mg | ORAL_TABLET | Freq: Once | ORAL | Status: DC | PRN

## 2023-07-31 MED ORDER — INSULIN ASPART 100 UNIT/ML IJ SOLN
0.0000 [IU] | Freq: Every day | INTRAMUSCULAR | Status: DC
  Administered 2023-07-31: 2 [IU] via SUBCUTANEOUS

## 2023-07-31 MED ORDER — INSULIN ASPART 100 UNIT/ML IJ SOLN
0.0000 [IU] | Freq: Three times a day (TID) | INTRAMUSCULAR | Status: DC
  Administered 2023-07-31: 1 [IU] via SUBCUTANEOUS
  Administered 2023-07-31 (×2): 2 [IU] via SUBCUTANEOUS

## 2023-07-31 NOTE — Plan of Care (Signed)

## 2023-07-31 NOTE — Progress Notes (Signed)
eLink Physician-Brief Progress Note Patient Name: Holly Nielsen DOB: 15-Oct-1991 MRN: [id number]   Date of Service  07/31/2023  HPI/Events of Note  K and phos notified  eICU Interventions  15 mmol IV k phos x 1 ordered     Intervention Category Major Interventions: Electrolyte abnormality - evaluation and management  Oretha Milch 07/31/2023, 5:12 AM

## 2023-07-31 NOTE — Progress Notes (Signed)
eLink Physician-Brief Progress Note Patient Name: Holly Nielsen DOB: 1991/01/28 MRN: [id number]   Date of Service  07/31/2023  HPI/Events of Note  78 F obese, hx of asthma developed anaphylaxis, given epipen by boyfriend and another when fire department arrived with Solumedrol, Benadryl and nebs.  Seen not in distress, on room air  eICU Interventions  Anaphylaxis unclear trigger at this time Monitor glucose while on systemic steroids as initial glucose elevated     Intervention Category Evaluation Type: New Patient Evaluation  Darl Pikes 07/31/2023, 12:48 AM

## 2023-07-31 NOTE — Plan of Care (Signed)

## 2023-08-01 DIAGNOSIS — T782XXD Anaphylactic shock, unspecified, subsequent encounter: Secondary | ICD-10-CM

## 2023-08-01 LAB — GLUCOSE, CAPILLARY
Glucose-Capillary: 109 mg/dL — ABNORMAL HIGH (ref 70–99)
Glucose-Capillary: 87 mg/dL (ref 70–99)
Glucose-Capillary: 114 mg/dL — ABNORMAL HIGH (ref 70–99)
Glucose-Capillary: 151 mg/dL — ABNORMAL HIGH (ref 70–99)

## 2023-08-01 MED ORDER — FAMOTIDINE 20 MG PO TABS: 20.0000 mg | ORAL_TABLET | Freq: Two times a day (BID) | ORAL | Status: DC | PRN

## 2023-08-01 MED ORDER — PAROXETINE HCL ER 12.5 MG PO TB24
12.5000 mg | ORAL_TABLET | Freq: Every morning | ORAL | Status: DC
  Administered 2023-08-02 – 2023-08-04 (×3): 12.5 mg via ORAL
  Filled 2023-08-01 (×3): qty 1

## 2023-08-01 MED ORDER — LORATADINE 10 MG PO TABS
10.0000 mg | ORAL_TABLET | Freq: Every day | ORAL | Status: DC
  Administered 2023-08-02 – 2023-08-04 (×3): 10 mg via ORAL
  Filled 2023-08-01 (×3): qty 1

## 2023-08-01 MED ORDER — ACETAMINOPHEN 325 MG PO TABS
650.0000 mg | ORAL_TABLET | Freq: Four times a day (QID) | ORAL | Status: DC | PRN
  Administered 2023-08-01 – 2023-08-02 (×4): 650 mg via ORAL
  Filled 2023-08-01 (×4): qty 2

## 2023-08-01 MED ORDER — DIPHENHYDRAMINE HCL 25 MG PO CAPS: 25.0000 mg | ORAL_CAPSULE | Freq: Four times a day (QID) | ORAL | Status: DC | PRN

## 2023-08-01 MED ORDER — PREDNISONE 20 MG PO TABS
20.0000 mg | ORAL_TABLET | Freq: Every day | ORAL | Status: DC
  Administered 2023-08-01 – 2023-08-04 (×4): 20 mg via ORAL
  Filled 2023-08-01 (×4): qty 1

## 2023-08-01 MED ORDER — LIDOCAINE 5 % EX PTCH
1.0000 | MEDICATED_PATCH | CUTANEOUS | Status: DC
  Administered 2023-08-01: 1 via TRANSDERMAL
  Filled 2023-08-01: qty 1

## 2023-08-01 MED ORDER — KETOROLAC TROMETHAMINE 30 MG/ML IJ SOLN
30.0000 mg | Freq: Once | INTRAMUSCULAR | Status: AC
  Administered 2023-08-01: 30 mg via INTRAVENOUS
  Filled 2023-08-01: qty 1

## 2023-08-01 NOTE — Progress Notes (Addendum)
NAME:  Holly Nielsen, MRN:  [id number], DOB:  [birth year], LOS: 2 ADMISSION DATE:  07/30/2023, CONSULTATION DATE:  07/30/23 REFERRING MD:  Doran Durand CHIEF COMPLAINT:  Anaphylaxis   History of Present Illness:  Holly Nielsen is a 32 y.o. female who has a PMH as below including but not limited to Asthma, multiple allergies. She was in her usual state of health 8/1 until around 1800 when she developed eye itching, dyspnea, wheezing. She tried to go get her epi pen but was unable to do so because of dyspnea so called 911 and her boyfriend who works across the street. Boyfriend arrived and administered epi pen as fire department arrived. She then received an additional epi pen along with Benadryl, Solumedrol, and two nebulizer treatments. She had some improvement but remained dyspneic. In ED, she was started on Epinephrine infusion which helped significantly. This was held briefly, but wheezing then recurred; therefore, epi was restarted.  She denies any new foods or medications. States she ate [place] food around 1300 but this was leftovers from the day prior which she tolerated fine. She is not on an ACE. She states she has never had allergic symptoms this severe before. She has seen an allergist in the past and was told that she was allergic to everything except [place]. She used to be on Xolair but is no longer on this due to lack of insurance coverage.  She is currently on 5 Epi and is stable. She is able to speak in full sentences without difficulty. She has no wheezing. She does have obvious periorbital edema which she feels has improved somewhat.  Pertinent  Medical History:  has Morbid obesity with body mass index of 40.0-49.9 (HCC); ADD (attention deficit disorder); Amenorrhea; Anxiety and depression; Cyst of thyroid; Guillain-Barre syndrome (HCC); H/O removal of thyroglossal duct cyst; History of iron deficiency anemia; Pica in adults; Unspecified asthma with (acute) exacerbation; Severe  persistent asthma with acute bronchitis and acute exacerbation; URI (upper respiratory infection); Anaphylaxis; and Allergic reaction on their problem list.  Significant Hospital Events: Including procedures, antibiotic start and stop dates in addition to other pertinent events   8/1 admit.  Interim History / Subjective:   Able to wean off epinephrine 8/2 in the morning. She has felt some subjective chest tightening, discomfort, did briefly have some facial flushing. Additional does Solu-Medrol given 8/2, completed scheduled 4 doses of Benadryl, remains on Pepcid  Objective:  Blood pressure 117/82, pulse (!) 53, temperature 98.4 F (36.9 C), temperature source Oral, resp. rate 17, height 4\' 11"  (1.499 m), weight 123.9 kg, SpO2 94%.        Intake/Output Summary (Last 24 hours) at 08/01/2023 0805 Last data filed at 08/01/2023 0600 Gross per 24 hour  Intake 444.42 ml  Output --  Net 444.42 ml   Filed Weights   07/31/23 0000  Weight: 123.9 kg    Examination: General: Y obese young woman, no distress, laying in bed. Neuro: Awake alert appropriate, follows commands, no deficits HEENT: No significant facial edema.  No stridor.  Lips, tongue appear normal with normal movement and phonation Cardiovascular: Regular, distant, no murmur Lungs: Clear bilaterally, no wheeze no crackles Abdomen: Obese, nondistended positive bowel sounds Musculoskeletal: No deformities.  No edema Skin: Slight redness face but symmetrical, no rash   Assessment & Plan:   Anaphylaxis -unclear inciting exposure.  Improved and epinephrine off 8/2 -Change Benadryl and Pepcid to as needed -Prednisone today 8/3 and taper: 20mg  x 5 days, then 10mg  x  5 days, then off -Food allergy panel sent on 8/2, follow-up results -Will need outpatient allergy referral -Will need to keep EpiPen available once discharged  Hx Asthma.  Followed by Dr. Everardo All in our office -Continue home Singulair -Continue budesonide/Brovana  scheduled at this time.  Change back to her usual home AirDuo at the time of discharge -Albuterol as needed -She was on Xolair in the past.  Will need to assess for possible reinitiation for both her asthma and this allergic process  Hyperglycemia on steroids -Sliding scale insulin in place  Okay to transition out of the ICU on 8/3   Best practice (evaluated daily):  Diet/type: Regular consistency (see orders)  DVT prophylaxis: prophylactic heparin  GI prophylaxis: H2B Lines: N/A Foley:  N/A Code Status:  full code Last date of multidisciplinary goals of care discussion: None yet.  Labs   CBC: Recent Labs  Lab 07/30/23 1946 07/31/23 0324  WBC 8.0 14.6*  NEUTROABS 5.0  --   HGB 12.8 12.5  HCT 41.2 40.4  MCV 81.1 81.8  PLT 229 280    Basic Metabolic Panel: Recent Labs  Lab 07/30/23 1946 07/31/23 0324  NA 138 135  K 3.6 3.4*  CL 103 106  CO2 24 13*  GLUCOSE 121* 193*  BUN 12 6  CREATININE 0.93 1.15*  CALCIUM 8.5* 7.9*  MG  --  2.0  PHOS  --  2.0*   GFR: Estimated Creatinine Clearance: 84.5 mL/min (A) (by C-G formula based on SCr of 1.15 mg/dL (H)). Recent Labs  Lab 07/30/23 1946 07/31/23 0324  WBC 8.0 14.6*    Liver Function Tests: No results for input(s): "AST", "ALT", "ALKPHOS", "BILITOT", "PROT", "ALBUMIN" in the last 168 hours. No results for input(s): "LIPASE", "AMYLASE" in the last 168 hours. No results for input(s): "AMMONIA" in the last 168 hours.  ABG    Component Value Date/Time   HCO3 30.3 (H) 03/13/2022 1840   O2SAT 79.7 03/13/2022 1840     Coagulation Profile: No results for input(s): "INR", "PROTIME" in the last 168 hours.  Cardiac Enzymes: No results for input(s): "CKTOTAL", "CKMB", "CKMBINDEX", "TROPONINI" in the last 168 hours.  HbA1C: Hgb A1c MFr Bld  Date/Time Value Ref Range Status  07/31/2023 03:24 AM 5.8 (H) 4.8 - 5.6 % Final    Comment:    (NOTE) Pre diabetes:          5.7%-6.4%  Diabetes:               >6.4%  Glycemic control for   <7.0% adults with diabetes     CBG: Recent Labs  Lab 07/31/23 0734 07/31/23 1108 07/31/23 1539 07/31/23 2133 08/01/23 0732  GLUCAP 151* 132* 169* 148* 109*    Critical care time:  NA    Levy Pupa, MD, PhD 08/01/2023, 8:05 AM [facility] [phone] or if no answer before 7:00PM call [phone] For any issues after 7:00PM please call eLink [phone]

## 2023-08-01 NOTE — Progress Notes (Signed)
Telephone call to e-Link for headache that hasn't improved 7/10 tylenol was given at 1818.

## 2023-08-01 NOTE — Progress Notes (Signed)
eLink Physician-Brief Progress Note Patient Name: Holly Nielsen DOB: 04-Oct-1991 MRN: [id number]   Date of Service  08/01/2023  HPI/Events of Note  Reports headache not improved on tylenol Pain 7/10 States able to take ibuprofen/aleve  eICU Interventions  Will order toradol 30 mg IV once     Intervention Category Minor Interventions: Other:   Mechele Collin 08/01/2023, 11:09 PM

## 2023-08-01 NOTE — Plan of Care (Signed)
  Problem: Nutrition: Goal: Adequate nutrition will be maintained Outcome: Progressing   Problem: Coping: Goal: Level of anxiety will decrease Outcome: Progressing   Problem: Pain Managment: Goal: General experience of comfort will improve Outcome: Progressing   Problem: Safety: Goal: Ability to remain free from injury will improve Outcome: Progressing   Problem: Skin Integrity: Goal: Risk for impaired skin integrity will decrease Outcome: Progressing   Problem: Coping: Goal: Ability to adjust to condition or change in health will improve Outcome: Progressing

## 2023-08-02 DIAGNOSIS — T782XXA Anaphylactic shock, unspecified, initial encounter: Secondary | ICD-10-CM

## 2023-08-02 LAB — GLUCOSE, CAPILLARY
Glucose-Capillary: 82 mg/dL (ref 70–99)
Glucose-Capillary: 116 mg/dL — ABNORMAL HIGH (ref 70–99)
Glucose-Capillary: 100 mg/dL — ABNORMAL HIGH (ref 70–99)
Glucose-Capillary: 153 mg/dL — ABNORMAL HIGH (ref 70–99)

## 2023-08-02 NOTE — Progress Notes (Signed)
PROGRESS NOTE    Holly Nielsen  BJY:[id number] DOB: 1991/03/26 DOA: 07/30/2023 PCP: Pcp, No  Outpatient Specialists:     Brief Narrative:  Patient is a 32 year old Caucasian female, morbidly obese, with past medical history significant for Asthma, multiple allergies, pica, Guillain-Barr syndrome and iron deficiency anemia.  Patient was initially admitted to ICU team with anaphylaxis.  Patient was managed with several doses of epinephrine.  At some point, patient was on epinephrine drip.  Etiology of anaphylaxis remains unclear.  Patient has improved significantly.  Hospitalist team is assumed care of patient today.  08/02/2023: Patient seen alongside patient's boyfriend.  Patient is stable.  No wheezing.  No constitutional symptoms endorsed.  Continue prednisone for now.  Patient is also on Benadryl, Pepcid, Claritin, Singulair, and pulmicort.  Assessment & Plan:   Principal Problem:   Anaphylaxis Active Problems:   Allergic reaction   Anaphylaxis: -Etiology unclear. -Continue prednisone and no other medications listed above. -Will likely be discharged back home tomorrow if patient remains stable. -Patient will follow-up with Allergist on discharge.  History of asthma: -Continue singular -Continue budesonide and Brovana. -Supportive care.  Hyperglycemia: -Likely steroid-induced. -Improving.  Morbid obesity: -Diet and exercise. -PCP to consider semaglutide or Mounjaro on discharge.   DVT prophylaxis: Subcutaneous heparin. Code Status: Full code. Family Communication: Boyfriend at bedside. Disposition Plan: Likely discharge back in 24 to 48 hours.  Patient remains inpatient.    Consultants:  Patient was transferred from ICU team.  Procedures:  None.  Antimicrobials:  None.   Subjective: No new complaints. No shortness of breath. No chest pain.  Objective: Vitals:   08/01/23 2121 08/02/23 0541 08/02/23 0728 08/02/23 0756  BP: (!) 126/100 124/83  128/89   Pulse: 73 63  74  Resp: 18   17  Temp: 97.7 F (36.5 C) (!) 97.5 F (36.4 C)  98 F (36.7 C)  TempSrc: Oral Oral    SpO2: 98% 99% 99% 99%  Weight:      Height:       No intake or output data in the 24 hours ending 08/02/23 1216 Filed Weights   07/31/23 0000  Weight: 123.9 kg    Examination:  General exam: Appears calm and comfortable.  Patient is morbidly obese. Respiratory system: Clear to auscultation.  Cardiovascular system: S1 & S2 heard. Gastrointestinal system: Abdomen is obese, soft and nontender.   Central nervous system: Alert and oriented.  Patient moves all extremities. Extremities: Fullness of the ankle.  Data Reviewed: I have personally reviewed following labs and imaging studies  CBC: Recent Labs  Lab 07/30/23 1946 07/31/23 0324  WBC 8.0 14.6*  NEUTROABS 5.0  --   HGB 12.8 12.5  HCT 41.2 40.4  MCV 81.1 81.8  PLT 229 280   Basic Metabolic Panel: Recent Labs  Lab 07/30/23 1946 07/31/23 0324  NA 138 135  K 3.6 3.4*  CL 103 106  CO2 24 13*  GLUCOSE 121* 193*  BUN 12 6  CREATININE 0.93 1.15*  CALCIUM 8.5* 7.9*  MG  --  2.0  PHOS  --  2.0*   GFR: Estimated Creatinine Clearance: 84.5 mL/min (A) (by C-G formula based on SCr of 1.15 mg/dL (H)). Liver Function Tests: No results for input(s): "AST", "ALT", "ALKPHOS", "BILITOT", "PROT", "ALBUMIN" in the last 168 hours. No results for input(s): "LIPASE", "AMYLASE" in the last 168 hours. No results for input(s): "AMMONIA" in the last 168 hours. Coagulation Profile: No results for input(s): "INR", "PROTIME" in the last 168 hours.  Cardiac Enzymes: No results for input(s): "CKTOTAL", "CKMB", "CKMBINDEX", "TROPONINI" in the last 168 hours. BNP (last 3 results) No results for input(s): "PROBNP" in the last 8760 hours. HbA1C: Recent Labs    07/31/23 0324  HGBA1C 5.8*   CBG: Recent Labs  Lab 08/01/23 0732 08/01/23 1122 08/01/23 1623 08/01/23 2122 08/02/23 0639  GLUCAP 109* 87 114* 151* 82    Lipid Profile: No results for input(s): "CHOL", "HDL", "LDLCALC", "TRIG", "CHOLHDL", "LDLDIRECT" in the last 72 hours. Thyroid Function Tests: No results for input(s): "TSH", "T4TOTAL", "FREET4", "T3FREE", "THYROIDAB" in the last 72 hours. Anemia Panel: No results for input(s): "VITAMINB12", "FOLATE", "FERRITIN", "TIBC", "IRON", "RETICCTPCT" in the last 72 hours. Urine analysis: No results found for: "COLORURINE", "APPEARANCEUR", "LABSPEC", "PHURINE", "GLUCOSEU", "HGBUR", "BILIRUBINUR", "KETONESUR", "PROTEINUR", "UROBILINOGEN", "NITRITE", "LEUKOCYTESUR" Sepsis Labs: @LABRCNTIP (procalcitonin:4,lacticidven:4)  ) Recent Results (from the past 240 hour(s))  MRSA Next Gen by PCR, Nasal     Status: None   Collection Time: 07/30/23 11:29 PM   Specimen: Nasal Mucosa; Nasal Swab  Result Value Ref Range Status   MRSA by PCR Next Gen NOT DETECTED NOT DETECTED Final    Comment: (NOTE) The GeneXpert MRSA Assay (FDA approved for NASAL specimens only), is one component of a comprehensive MRSA colonization surveillance program. It is not intended to diagnose MRSA infection nor to guide or monitor treatment for MRSA infections. Test performance is not FDA approved in patients less than 69 years old. Performed at [facility], [address] [place], [place] [address]          Radiology Studies: No results found.      Scheduled Meds:  arformoterol  15 mcg Nebulization BID   budesonide (PULMICORT) nebulizer solution  0.5 mg Nebulization BID   Chlorhexidine Gluconate Cloth  6 each Topical Daily   heparin  5,000 Units Subcutaneous Q8H   insulin aspart  0-5 Units Subcutaneous QHS   insulin aspart  0-9 Units Subcutaneous TID WC   lidocaine  1 patch Transdermal Q24H   loratadine  10 mg Oral Daily   montelukast  10 mg Oral QHS   PARoxetine  12.5 mg Oral q morning   predniSONE  20 mg Oral Q breakfast   Continuous Infusions:   LOS: 3 days    Time spent: 35  minutes.    Berton Mount, MD  [facility] Pager #: [phone] 7PM-7AM contact night coverage as above

## 2023-08-02 NOTE — Plan of Care (Signed)
  Problem: Education: Goal: Knowledge of General Education information will improve Description: Including pain rating scale, medication(s)/side effects and non-pharmacologic comfort measures Outcome: Progressing   Problem: Health Behavior/Discharge Planning: Goal: Ability to manage health-related needs will improve Outcome: Progressing   Problem: Clinical Measurements: Goal: Ability to maintain clinical measurements within normal limits will improve Outcome: Progressing Goal: Will remain free from infection Outcome: Progressing Goal: Diagnostic test results will improve Outcome: Progressing Goal: Respiratory complications will improve Outcome: Progressing Goal: Cardiovascular complication will be avoided Outcome: Progressing   Problem: Activity: Goal: Risk for activity intolerance will decrease Outcome: Progressing   Problem: Nutrition: Goal: Adequate nutrition will be maintained Outcome: Completed/Met   Problem: Coping: Goal: Level of anxiety will decrease Outcome: Progressing   Problem: Elimination: Goal: Will not experience complications related to bowel motility Outcome: Progressing Goal: Will not experience complications related to urinary retention Outcome: Progressing   Problem: Safety: Goal: Ability to remain free from injury will improve Outcome: Progressing   Problem: Skin Integrity: Goal: Risk for impaired skin integrity will decrease Outcome: Progressing

## 2023-08-03 ENCOUNTER — Other Ambulatory Visit (HOSPITAL_COMMUNITY): Payer: Self-pay

## 2023-08-03 ENCOUNTER — Other Ambulatory Visit: Payer: Self-pay

## 2023-08-03 DIAGNOSIS — T782XXD Anaphylactic shock, unspecified, subsequent encounter: Secondary | ICD-10-CM

## 2023-08-03 LAB — GLUCOSE, CAPILLARY
Glucose-Capillary: 79 mg/dL (ref 70–99)
Glucose-Capillary: 128 mg/dL — ABNORMAL HIGH (ref 70–99)
Glucose-Capillary: 123 mg/dL — ABNORMAL HIGH (ref 70–99)
Glucose-Capillary: 118 mg/dL — ABNORMAL HIGH (ref 70–99)
Glucose-Capillary: 130 mg/dL — ABNORMAL HIGH (ref 70–99)

## 2023-08-03 MED ORDER — PREDNISONE 10 MG PO TABS
ORAL_TABLET | ORAL | 0 refills | Status: AC
Start: 2023-08-04 — End: 2023-08-12
  Filled 2023-08-03 (×2): qty 13, 9d supply, fill #0

## 2023-08-03 MED ORDER — DOCUSATE SODIUM 100 MG PO CAPS
100.0000 mg | ORAL_CAPSULE | Freq: Two times a day (BID) | ORAL | 0 refills | Status: DC | PRN
  Filled 2023-08-03 (×2): qty 10, 5d supply, fill #0

## 2023-08-03 MED ORDER — DIPHENHYDRAMINE HCL 25 MG PO TABS
25.0000 mg | ORAL_TABLET | Freq: Four times a day (QID) | ORAL | 0 refills | Status: DC | PRN
  Filled 2023-08-03: qty 24, 6d supply, fill #0
  Filled 2023-08-03: qty 30, 8d supply, fill #0

## 2023-08-03 MED ORDER — LIDOCAINE 5 % EX PTCH
1.0000 | MEDICATED_PATCH | CUTANEOUS | 0 refills | Status: DC
  Filled 2023-08-03 (×2): qty 30, 30d supply, fill #0

## 2023-08-03 MED ORDER — POLYETHYLENE GLYCOL 3350 17 GM/SCOOP PO POWD
17.0000 g | Freq: Every day | ORAL | 0 refills | Status: DC | PRN
  Filled 2023-08-03 (×2): qty 238, 14d supply, fill #0

## 2023-08-03 NOTE — Evaluation (Signed)
Physical Therapy Evaluation Patient Details Name: Holly Nielsen MRN: [id number] DOB: Nov 12, 1991 Today's Date: 08/03/2023  History of Present Illness  32 year old woman admitted with apparent anaphylaxis with periorbital edema, a feeling of throat narrowing and dyspnea. PMH: obesity, asthma, multiple allergies, GBS   Clinical Impression  Pt admitted with above. Per RN pt was ambulating in room indep yesterday and then reported this morning to be feeling very weak. Pt c/o "My legs feel like jello." When ambulating with PT. Pt progressively used L HHA more with ambulation and eventually also used hallway rail with R UE due to feeling "unsteady." Pt with noted DOE, SpO2 > 97% on RA. Pt also with noted redness/rash on chest. RN and MD notified. At baseline pt is indep and works part time at the front desk of a hotel. Pt now with generalized weakness requiring assist for ambulation and is at increased falls risk. Pt will need to do stairs prior to d/c home. MD to keep patient another night and PT to reassess mobility and d/c recommendations tomorrow.       If plan is discharge home, recommend the following: A little help with walking and/or transfers;A little help with bathing/dressing/bathroom;Assist for transportation;Help with stairs or ramp for entrance   Can travel by private vehicle        Equipment Recommendations  (suspect once she feels better she wont need AD)  Recommendations for Other Services       Functional Status Assessment Patient has had a recent decline in their functional status and demonstrates the ability to make significant improvements in function in a reasonable and predictable amount of time.     Precautions / Restrictions Precautions Precautions: Fall Precaution Comments: morbidly obese Restrictions Weight Bearing Restrictions: No      Mobility  Bed Mobility Overal bed mobility: Independent                  Transfers Overall transfer level: Modified  independent Equipment used: None               General transfer comment: pt guarded and cautious    Ambulation/Gait Ambulation/Gait assistance: Min assist Gait Distance (Feet): 100 Feet Assistive device: 1 person hand held assist Gait Pattern/deviations: Step-through pattern, Decreased stride length, Wide base of support Gait velocity: dec     General Gait Details: pt initially ambulating without AD, pt stating "My legs feel like jello." Pt bracing self with arms out to her side. PT provided pt HHA on L UE and pt progressively placed more support on PT's hand and eventually started holding onto railling with R UE closer to room  Stairs            Wheelchair Mobility     Tilt Bed    Modified Rankin (Stroke Patients Only)       Balance Overall balance assessment: Mild deficits observed, not formally tested                                           Pertinent Vitals/Pain Pain Assessment Pain Assessment: No/denies pain    Home Living Family/patient expects to be discharged to:: Private residence Living Arrangements: Spouse/significant other Available Help at Discharge: Family;Available PRN/intermittently Type of Home: House Home Access: Stairs to enter Entrance Stairs-Rails: Right Entrance Stairs-Number of Steps: 8   Home Layout: One level Home Equipment: None      Prior  Function Prior Level of Function : Working/employed;Independent/Modified Independent             Mobility Comments: works part time at hotel front desk in [place] ADLs Comments: indep     Higher education careers adviser   Dominant Hand: Right    Extremity/Trunk Assessment   Upper Extremity Assessment Upper Extremity Assessment: Overall WFL for tasks assessed    Lower Extremity Assessment Lower Extremity Assessment: Generalized weakness    Cervical / Trunk Assessment Cervical / Trunk Assessment: Normal  Communication   Communication: No difficulties  Cognition  Arousal/Alertness: Awake/alert Behavior During Therapy: WFL for tasks assessed/performed Overall Cognitive Status: Within Functional Limits for tasks assessed                                          General Comments General comments (skin integrity, edema, etc.): VSS, SpO2 >97% on RA, HR in 70s, noted DOE with ambulation, pt with noted redness rash along chest    Exercises     Assessment/Plan    PT Assessment Patient needs continued PT services  PT Problem List Decreased strength;Decreased balance;Decreased activity tolerance;Decreased mobility;Decreased coordination;Decreased cognition       PT Treatment Interventions DME instruction;Gait training;Stair training;Functional mobility training;Therapeutic activities;Therapeutic exercise;Balance training    PT Goals (Current goals can be found in the Care Plan section)  Acute Rehab PT Goals Patient Stated Goal: feel better PT Goal Formulation: With patient Time For Goal Achievement: 08/17/23 Potential to Achieve Goals: Good    Frequency Min 1X/week     Co-evaluation               AM-PAC PT "6 Clicks" Mobility  Outcome Measure Help needed turning from your back to your side while in a flat bed without using bedrails?: None Help needed moving from lying on your back to sitting on the side of a flat bed without using bedrails?: None Help needed moving to and from a bed to a chair (including a wheelchair)?: None Help needed standing up from a chair using your arms (e.g., wheelchair or bedside chair)?: None Help needed to walk in hospital room?: A Little Help needed climbing 3-5 steps with a railing? : A Little 6 Click Score: 22    End of Session Equipment Utilized During Treatment: Gait belt Activity Tolerance: Patient limited by fatigue Patient left: in chair;with call bell/phone within reach Nurse Communication: Mobility status (rash on chest) PT Visit Diagnosis: Difficulty in walking, not elsewhere  classified (R26.2)    Time: 6962-9528 PT Time Calculation (min) (ACUTE ONLY): 26 min   Charges:   PT Evaluation $PT Eval Moderate Complexity: 1 Mod PT Treatments $Gait Training: 8-22 mins PT General Charges $$ ACUTE PT VISIT: 1 Visit         Lewis Shock, PT, DPT Acute Rehabilitation Services Secure chat preferred Office #: [phone]   Iona Hansen 08/03/2023, 12:53 PM

## 2023-08-03 NOTE — Progress Notes (Signed)
PROGRESS NOTE    Holly Nielsen  ZOX:[id number] DOB: 1991/02/23 DOA: 07/30/2023 PCP: Pcp, No  Outpatient Specialists:     Brief Narrative:  Patient is a 32 year old Caucasian female, morbidly obese, with past medical history significant for Asthma, multiple allergies, pica, Guillain-Barr syndrome and iron deficiency anemia.  Patient was initially admitted to ICU team with anaphylaxis.  Patient was managed with several doses of epinephrine.  At some point, patient was on epinephrine drip.  Etiology of anaphylaxis remains unclear.  Patient has improved significantly.  Hospitalist team is assumed care of patient today.  08/03/2023: Patient was not keen on being discharged home today.  Patient reported feeling weak.  Physical therapy consulted.  Possible discharge tomorrow.  Assessment & Plan:   Principal Problem:   Anaphylaxis Active Problems:   Allergic reaction   Anaphylaxis: -Etiology unclear. -Continue prednisone and no other medications listed above. -Will likely be discharged back home tomorrow if patient remains stable. -Patient will follow-up with Allergist on discharge. -Continue prednisone.  History of asthma: -Continue singular -Continue budesonide and Brovana. -Supportive care. 08/03/2023: Stable.  Hyperglycemia: -Likely steroid-induced. 08/03/2023: Improved significantly.  Morbid obesity: -Diet and exercise. -PCP to consider semaglutide or Mounjaro on discharge.   DVT prophylaxis: Subcutaneous heparin. Code Status: Full code. Family Communication: Boyfriend at bedside. Disposition Plan: Likely discharge back in 24 to 48 hours.  Patient remains inpatient.    Consultants:  Patient was transferred from ICU team.  Procedures:  None.  Antimicrobials:  None.   Subjective: No new complaints. No shortness of breath. No chest pain.  Objective: Vitals:   08/03/23 0733 08/03/23 0809 08/03/23 1024 08/03/23 1611  BP: (!) 128/96  126/79 (!) 140/97  Pulse:  (!) 58  76 67  Resp: 17  18 17   Temp: (!) 97.5 F (36.4 C)   (!) 97.4 F (36.3 C)  TempSrc: Oral   Oral  SpO2: 98% 98% 98% 97%  Weight:      Height:        Intake/Output Summary (Last 24 hours) at 08/03/2023 1657 Last data filed at 08/03/2023 0830 Gross per 24 hour  Intake 840 ml  Output --  Net 840 ml   Filed Weights   07/31/23 0000 08/03/23 0553  Weight: 123.9 kg 125.6 kg    Examination:  General exam: Appears calm and comfortable.  Patient is morbidly obese. Respiratory system: Clear to auscultation.  Cardiovascular system: S1 & S2 heard. Gastrointestinal system: Abdomen is obese, soft and nontender.   Central nervous system: Alert and oriented.  Patient moves all extremities. Extremities: Fullness of the ankle.  Data Reviewed: I have personally reviewed following labs and imaging studies  CBC: Recent Labs  Lab 07/30/23 1946 07/31/23 0324  WBC 8.0 14.6*  NEUTROABS 5.0  --   HGB 12.8 12.5  HCT 41.2 40.4  MCV 81.1 81.8  PLT 229 280   Basic Metabolic Panel: Recent Labs  Lab 07/30/23 1946 07/31/23 0324  NA 138 135  K 3.6 3.4*  CL 103 106  CO2 24 13*  GLUCOSE 121* 193*  BUN 12 6  CREATININE 0.93 1.15*  CALCIUM 8.5* 7.9*  MG  --  2.0  PHOS  --  2.0*   GFR: Estimated Creatinine Clearance: 85.3 mL/min (A) (by C-G formula based on SCr of 1.15 mg/dL (H)). Liver Function Tests: No results for input(s): "AST", "ALT", "ALKPHOS", "BILITOT", "PROT", "ALBUMIN" in the last 168 hours. No results for input(s): "LIPASE", "AMYLASE" in the last 168 hours. No results for input(s): "  AMMONIA" in the last 168 hours. Coagulation Profile: No results for input(s): "INR", "PROTIME" in the last 168 hours. Cardiac Enzymes: No results for input(s): "CKTOTAL", "CKMB", "CKMBINDEX", "TROPONINI" in the last 168 hours. BNP (last 3 results) No results for input(s): "PROBNP" in the last 8760 hours. HbA1C: No results for input(s): "HGBA1C" in the last 72 hours.  CBG: Recent Labs   Lab 08/02/23 2030 08/03/23 0734 08/03/23 1022 08/03/23 1138 08/03/23 1612  GLUCAP 153* 79 128* 123* 118*   Lipid Profile: No results for input(s): "CHOL", "HDL", "LDLCALC", "TRIG", "CHOLHDL", "LDLDIRECT" in the last 72 hours. Thyroid Function Tests: No results for input(s): "TSH", "T4TOTAL", "FREET4", "T3FREE", "THYROIDAB" in the last 72 hours. Anemia Panel: No results for input(s): "VITAMINB12", "FOLATE", "FERRITIN", "TIBC", "IRON", "RETICCTPCT" in the last 72 hours. Urine analysis: No results found for: "COLORURINE", "APPEARANCEUR", "LABSPEC", "PHURINE", "GLUCOSEU", "HGBUR", "BILIRUBINUR", "KETONESUR", "PROTEINUR", "UROBILINOGEN", "NITRITE", "LEUKOCYTESUR" Sepsis Labs: @LABRCNTIP (procalcitonin:4,lacticidven:4)  ) Recent Results (from the past 240 hour(s))  MRSA Next Gen by PCR, Nasal     Status: None   Collection Time: 07/30/23 11:29 PM   Specimen: Nasal Mucosa; Nasal Swab  Result Value Ref Range Status   MRSA by PCR Next Gen NOT DETECTED NOT DETECTED Final    Comment: (NOTE) The GeneXpert MRSA Assay (FDA approved for NASAL specimens only), is one component of a comprehensive MRSA colonization surveillance program. It is not intended to diagnose MRSA infection nor to guide or monitor treatment for MRSA infections. Test performance is not FDA approved in patients less than 28 years old. Performed at [facility], [address], [place], [place] [address]          Radiology Studies: No results found.      Scheduled Meds:  arformoterol  15 mcg Nebulization BID   budesonide (PULMICORT) nebulizer solution  0.5 mg Nebulization BID   Chlorhexidine Gluconate Cloth  6 each Topical Daily   heparin  5,000 Units Subcutaneous Q8H   insulin aspart  0-5 Units Subcutaneous QHS   insulin aspart  0-9 Units Subcutaneous TID WC   lidocaine  1 patch Transdermal Q24H   loratadine  10 mg Oral Daily   montelukast  10 mg Oral QHS   PARoxetine  12.5 mg Oral q morning    predniSONE  20 mg Oral Q breakfast   Continuous Infusions:   LOS: 4 days    Time spent: 35 minutes.    Berton Mount, MD  [facility] Pager #: [phone] 7PM-7AM contact night coverage as above

## 2023-08-03 NOTE — Plan of Care (Signed)
  Problem: Education: Goal: Knowledge of General Education information will improve Description: Including pain rating scale, medication(s)/side effects and non-pharmacologic comfort measures Outcome: Progressing   Problem: Health Behavior/Discharge Planning: Goal: Ability to manage health-related needs will improve Outcome: Progressing   Problem: Clinical Measurements: Goal: Ability to maintain clinical measurements within normal limits will improve Outcome: Progressing Goal: Will remain free from infection Outcome: Progressing Goal: Diagnostic test results will improve Outcome: Progressing Goal: Respiratory complications will improve Outcome: Progressing Goal: Cardiovascular complication will be avoided Outcome: Progressing   Problem: Activity: Goal: Risk for activity intolerance will decrease Outcome: Progressing   Problem: Coping: Goal: Level of anxiety will decrease Outcome: Progressing   Problem: Elimination: Goal: Will not experience complications related to bowel motility Outcome: Progressing Goal: Will not experience complications related to urinary retention Outcome: Progressing   Problem: Pain Managment: Goal: General experience of comfort will improve Outcome: Progressing   Problem: Safety: Goal: Ability to remain free from injury will improve Outcome: Progressing   Problem: Skin Integrity: Goal: Risk for impaired skin integrity will decrease Outcome: Progressing   Problem: Education: Goal: Ability to describe self-care measures that may prevent or decrease complications (Diabetes Survival Skills Education) will improve Outcome: Progressing Goal: Individualized Educational Video(s) Outcome: Progressing   Problem: Coping: Goal: Ability to adjust to condition or change in health will improve Outcome: Progressing   Problem: Fluid Volume: Goal: Ability to maintain a balanced intake and output will improve Outcome: Progressing   Problem: Health  Behavior/Discharge Planning: Goal: Ability to identify and utilize available resources and services will improve Outcome: Progressing Goal: Ability to manage health-related needs will improve Outcome: Progressing   Problem: Metabolic: Goal: Ability to maintain appropriate glucose levels will improve Outcome: Progressing   Problem: Nutritional: Goal: Maintenance of adequate nutrition will improve Outcome: Progressing Goal: Progress toward achieving an optimal weight will improve Outcome: Progressing   Problem: Skin Integrity: Goal: Risk for impaired skin integrity will decrease Outcome: Progressing   Problem: Tissue Perfusion: Goal: Adequacy of tissue perfusion will improve Outcome: Progressing   

## 2023-08-03 NOTE — Plan of Care (Addendum)
Around 1030 Pt called RN into room stating she was feeling weak and pt stated her body feels like " jello", RN took pt Blood sugar and vital sign all WNL. Pt stated she would notify RN if anything changes, pt waiting to round with MD.   Around 1140 Pt called RN into room stating the MD had already come to see pt and pt was very tired and only remembered a little of what MD said, Pt stated she was still feeling weak and fatigued and wanted RN to notify MD before discharging. RN notified Eben Burow MD. MD ordered a PT evaul before D/C. Per MD delay D/C until pt seen by PT first. RN made pt aware   Pt seen by PT, pt still feeling weak, after evaul PT and RN went to speak with Dr.Ogbata MD, per MD okay to keep pt today and monitor, PT to re-evaul pt tomorrow

## 2023-08-03 NOTE — Progress Notes (Addendum)
Transition of Care [facility]) - Inpatient Brief Assessment   Patient Details  Name: Nikia Weingart MRN: [id number] Date of Birth: 1991-05-03  Transition of Care [facility]) CM/SW Contact:    Janae Bridgeman, RN Phone Number: 08/03/2023, 12:29 PM   Clinical Narrative: Patient admitted for anaphylaxis.  PCP placed in the AVS for patient follow up if patient does not have primary care provider.  Patient is discharging home today.  Financial resources provided in the AVS.  08/03/2023 1413 - Patient states that she is feeling weak today and PT evaluated the patient and recommended patient discharge home tomorrow when her boyfriend will be at the home to assist.  Attending MD was notified.  No DME is needed.  I called the [facility] and follow up appointment is scheduled in the AVS for this Friday, 08/07/23.   Transition of Care Asessment: Insurance and Status: (P) Selfpay (No insurance provider.)   Home environment has been reviewed: (P) Yes - home Prior level of function:: (P) Independent Prior/Current Home Services: (P) No current home services Social Determinants of Health Reivew: (P) SDOH reviewed needs interventions Readmission risk has been reviewed: (P) Yes Transition of care needs: (P) transition of care needs identified, TOC will continue to follow

## 2023-08-03 NOTE — Discharge Summary (Signed)
Physician Discharge Summary  Patient ID: Holly Nielsen MRN: [id number] DOB/AGE: [birth year] 32 y.o.  Admit date: 07/30/2023 Discharge date: 08/03/2023  Admission Diagnoses:  Discharge Diagnoses:  Principal Problem:   Anaphylaxis Active Problems:   Allergic reaction   Discharged Condition: stable  Hospital Course:  Patient is a 32 year old Caucasian female, morbidly obese, with past medical history significant for Asthma, multiple allergies, pica, Guillain-Barr syndrome and iron deficiency anemia.  Patient was initially admitted to ICU team with anaphylaxis.  Patient was managed with several doses of epinephrine.  At some point, patient was on epinephrine drip.  Etiology of anaphylaxis remains unclear.  Patient has improved significantly.  Patient was eventually transferred to the hospitalist team.  Patient has continued to do well on oral prednisone.  Patient will be discharged back into the care of the primary care provider, pulmonary team and allergy team.  Patient will be discharged on prednisone 20 Mg p.o. once daily for the next 4 days, then 10 Mg p.o. once daily for 5 days and stop.  Marland Kitchen  Anaphylaxis: -Etiology unclear. -Continue prednisone on discharge for the next 9 days. -Follow-up with allergy team, pulmonary team and primary care provider.   History of asthma: -Continue singular -Continue budesonide and Brovana. -Supportive care.   Hyperglycemia: -Likely steroid-induced. -Improved.   Morbid obesity: -Diet and exercise. -PCP to consider semaglutide or Mounjaro on discharge.  Consults: pulmonary/intensive care (patient was initially admitted to the ICU team).     Discharge Exam: Blood pressure 126/79, pulse 76, temperature (!) 97.5 F (36.4 C), temperature source Oral, resp. rate 18, height 4\' 11"  (1.499 m), weight 125.6 kg, SpO2 98%.   Disposition: Discharge disposition: 01-Home or Self Care       Discharge Instructions     Diet - low sodium heart healthy    Complete by: As directed    Increase activity slowly   Complete by: As directed       Allergies as of 08/03/2023       Reactions   Capsicum Anaphylaxis   Buspirone Diarrhea   Other Nausea And Vomiting, Diarrhea   Green bell peppers   Celery Oil Diarrhea   Iron Sucrose    Other reaction(s): Dizziness, Other (See Comments), Other - See Comments Pt became flushed, hot and diaphoretic after completion of infusion.  Pt became flushed, hot and diaphoretic after completion of infusion.    Nutricap [actical] Diarrhea   Pedi-pre Tape Spray [wound Dressing Adhesive] Hives   Anise Extract [flavoring Agent] Hives, Rash   Vanilla Extract        Medication List     STOP taking these medications    benzonatate 200 MG capsule Commonly known as: TESSALON   meloxicam 15 MG tablet Commonly known as: MOBIC   methocarbamol 500 MG tablet Commonly known as: ROBAXIN   omalizumab 150 MG/ML prefilled syringe Commonly known as: Xolair   ondansetron 4 MG disintegrating tablet Commonly known as: ZOFRAN-ODT       TAKE these medications    albuterol 108 (90 Base) MCG/ACT inhaler Commonly known as: VENTOLIN HFA Inhale 2 puffs into the lungs every 6 (six) hours as needed for wheezing. What changed: Another medication with the same name was changed. Make sure you understand how and when to take each.   albuterol (2.5 MG/3ML) 0.083% nebulizer solution Commonly known as: PROVENTIL Take 3 mLs by nebulization every 6 (six) hours as needed for wheezing. Contact Doctor for more refills. What changed: reasons to take this   cetirizine  10 MG tablet Commonly known as: ZYRTEC Take 10 mg by mouth daily.   diphenhydrAMINE 25 mg capsule Commonly known as: BENADRYL Take 1 capsule (25 mg total) by mouth every 6 (six) hours as needed for allergies or itching.   docusate sodium 100 MG capsule Commonly known as: COLACE Take 1 capsule (100 mg total) by mouth 2 (two) times daily as needed for mild  constipation.   EPINEPHrine 0.3 mg/0.3 mL Soaj injection Commonly known as: EPI-PEN Inject 0.3 mg into the muscle as needed for anaphylaxis. What changed: when to take this   Fluticasone-Salmeterol 232-14 MCG/ACT Aepb Commonly known as: AirDuo RespiClick 232/14 Inhale 1 puff into the lungs 2 (two) times daily.   ipratropium-albuterol 0.5-2.5 (3) MG/3ML Soln Commonly known as: DUONEB Take 3 mLs by nebulization every 4 (four) hours as needed.   lidocaine 5 % Commonly known as: LIDODERM Place 1 patch onto the skin daily. Remove & Discard patch within 12 hours or as directed by MD   montelukast 10 MG tablet Commonly known as: SINGULAIR Take 1 tablet (10 mg total) by mouth at bedtime.   PARoxetine 12.5 MG 24 hr tablet Commonly known as: PAXIL-CR Take 12.5 mg by mouth every morning.   polyethylene glycol 17 g packet Commonly known as: MIRALAX / GLYCOLAX Take 17 g by mouth daily as needed for moderate constipation.   predniSONE 10 MG tablet Commonly known as: DELTASONE Prednisone 20 mg po once daily for 4 days and then 10 mg po once daily for 5 days and stop. Start taking on: August 04, 2023 What changed:  medication strength how much to take how to take this when to take this additional instructions       Time spent: 35 minutes.  SignedBarnetta Chapel 08/03/2023, 11:46 AM

## 2023-08-04 ENCOUNTER — Other Ambulatory Visit (HOSPITAL_COMMUNITY): Payer: Self-pay

## 2023-08-04 ENCOUNTER — Other Ambulatory Visit: Payer: Self-pay | Admitting: Pulmonary Disease

## 2023-08-04 ENCOUNTER — Other Ambulatory Visit: Payer: Self-pay

## 2023-08-04 DIAGNOSIS — F419 Anxiety disorder, unspecified: Secondary | ICD-10-CM | POA: Diagnosis not present

## 2023-08-04 DIAGNOSIS — T782XXD Anaphylactic shock, unspecified, subsequent encounter: Secondary | ICD-10-CM | POA: Diagnosis not present

## 2023-08-04 DIAGNOSIS — J4551 Severe persistent asthma with (acute) exacerbation: Secondary | ICD-10-CM

## 2023-08-04 DIAGNOSIS — R519 Headache, unspecified: Secondary | ICD-10-CM | POA: Diagnosis not present

## 2023-08-04 DIAGNOSIS — D509 Iron deficiency anemia, unspecified: Secondary | ICD-10-CM | POA: Diagnosis not present

## 2023-08-04 DIAGNOSIS — R0600 Dyspnea, unspecified: Secondary | ICD-10-CM | POA: Diagnosis not present

## 2023-08-04 DIAGNOSIS — J4550 Severe persistent asthma, uncomplicated: Secondary | ICD-10-CM | POA: Diagnosis not present

## 2023-08-04 DIAGNOSIS — J8283 Eosinophilic asthma: Secondary | ICD-10-CM | POA: Diagnosis not present

## 2023-08-04 DIAGNOSIS — R008 Other abnormalities of heart beat: Secondary | ICD-10-CM | POA: Diagnosis not present

## 2023-08-04 DIAGNOSIS — F32A Depression, unspecified: Secondary | ICD-10-CM | POA: Diagnosis not present

## 2023-08-04 DIAGNOSIS — T7849XA Other allergy, initial encounter: Secondary | ICD-10-CM | POA: Diagnosis not present

## 2023-08-04 DIAGNOSIS — E041 Nontoxic single thyroid nodule: Secondary | ICD-10-CM | POA: Diagnosis not present

## 2023-08-04 DIAGNOSIS — Z6841 Body Mass Index (BMI) 40.0 and over, adult: Secondary | ICD-10-CM | POA: Diagnosis not present

## 2023-08-04 LAB — BASIC METABOLIC PANEL
Anion gap: 10 (ref 5–15)
BUN: 21 mg/dL — ABNORMAL HIGH (ref 6–20)
CO2: 26 mmol/L (ref 22–32)
Calcium: 9.1 mg/dL (ref 8.9–10.3)
Chloride: 100 mmol/L (ref 98–111)
Creatinine, Ser: 0.95 mg/dL (ref 0.44–1.00)
GFR, Estimated: >60 mL/min (ref >60)
Glucose, Bld: 107 mg/dL — ABNORMAL HIGH (ref 70–99)
Potassium: 3.4 mmol/L — ABNORMAL LOW (ref 3.5–5.1)
Sodium: 136 mmol/L (ref 135–145)

## 2023-08-04 LAB — MAGNESIUM: Magnesium: 1.9 mg/dL (ref 1.7–2.4)

## 2023-08-04 LAB — GLUCOSE, CAPILLARY: Glucose-Capillary: 82 mg/dL (ref 70–99)

## 2023-08-04 LAB — TSH: TSH: 2.729 u[IU]/mL (ref 0.350–4.500)

## 2023-08-04 MED ORDER — ALBUTEROL SULFATE (2.5 MG/3ML) 0.083% IN NEBU
3.0000 mL | INHALATION_SOLUTION | Freq: Four times a day (QID) | RESPIRATORY_TRACT | 0 refills | Status: DC | PRN
  Filled 2023-08-04 – 2023-08-24 (×2): qty 90, 8d supply, fill #0

## 2023-08-04 MED ORDER — POTASSIUM CHLORIDE CRYS ER 20 MEQ PO TBCR: 40.0000 meq | EXTENDED_RELEASE_TABLET | Freq: Once | ORAL | Status: DC

## 2023-08-04 MED ORDER — ALBUTEROL SULFATE HFA 108 (90 BASE) MCG/ACT IN AERS
2.0000 | INHALATION_SPRAY | Freq: Four times a day (QID) | RESPIRATORY_TRACT | 5 refills | Status: DC | PRN
Start: 2023-08-04 — End: 2023-09-14
  Filled 2023-08-04 – 2023-08-24 (×2): qty 6.7, 25d supply, fill #0
  Filled 2023-09-02 – 2023-09-07 (×2): qty 6.7, 25d supply, fill #1
  Filled 2023-09-08: qty 18, 25d supply, fill #1
  Filled 2023-09-08 (×3): qty 6.7, 25d supply, fill #1

## 2023-08-04 NOTE — Progress Notes (Signed)
Physical Therapy Treatment Patient Details Name: Holly Nielsen MRN: [id number] DOB: 01-04-1991 Today's Date: 08/04/2023   History of Present Illness 32 year old woman admitted with apparent anaphylaxis with periorbital edema, a feeling of throat narrowing and dyspnea. PMH: obesity, asthma, multiple allergies, GBS    PT Comments  Pt greeted resting in bed and agreeable to session with continued progress towards acute goals. Pt endorsing feeling better this date with Les feeling more stable and less weak. Pt able to progress gait without AD or HHA at min guard level fading to supervision with increased distance. Pt demonstrating safe stair ascent/descent with education/encouragement for step-to pattern on descent for safety. Pt with continued DOE during gait, VSS on RA. Educated pt on appropriate activity progression and energy conservation techniques with pt verbalizing understanding. Anticipate safe discharge, with assist level outlined below, once medically cleared, will continue to follow acutely.     If plan is discharge home, recommend the following: A little help with walking and/or transfers;A little help with bathing/dressing/bathroom;Assist for transportation;Help with stairs or ramp for entrance   Can travel by private vehicle        Equipment Recommendations  None recommended by PT    Recommendations for Other Services       Precautions / Restrictions Precautions Precautions: Fall Precaution Comments: morbidly obese Restrictions Weight Bearing Restrictions: No     Mobility  Bed Mobility Overal bed mobility: Independent                  Transfers Overall transfer level: Modified independent Equipment used: None               General transfer comment: pt cautious    Ambulation/Gait Ambulation/Gait assistance: Min guard, Supervision Gait Distance (Feet): 300 Feet Assistive device: None Gait Pattern/deviations: Step-through pattern, Decreased stride  length, Wide base of support Gait velocity: slightly decr     General Gait Details: pt reporting improved strength of LEs, pt denies LEs feeling "like jello". steady gait without AD, some low clearance during swing phase   Stairs Stairs: Yes Stairs assistance: Min guard Stair Management: One rail Right, Alternating pattern, Forwards Number of Stairs: 6 General stair comments: up/down steps in stairwell, no LOB, encouraged/educated on step to pattern on descent for safety and increased stability   Wheelchair Mobility     Tilt Bed    Modified Rankin (Stroke Patients Only)       Balance Overall balance assessment: Mild deficits observed, not formally tested                                          Cognition Arousal/Alertness: Awake/alert Behavior During Therapy: WFL for tasks assessed/performed Overall Cognitive Status: Within Functional Limits for tasks assessed                                          Exercises      General Comments General comments (skin integrity, edema, etc.): VSS, noted DOE with ambulation, reviewed energy conservation techniques with pt verbakizing understanding      Pertinent Vitals/Pain Pain Assessment Pain Assessment: No/denies pain    Home Living                          Prior Function  PT Goals (current goals can now be found in the care plan section) Acute Rehab PT Goals Patient Stated Goal: feel better PT Goal Formulation: With patient Time For Goal Achievement: 08/17/23 Progress towards PT goals: Progressing toward goals    Frequency    Min 1X/week      PT Plan Current plan remains appropriate    Co-evaluation              AM-PAC PT "6 Clicks" Mobility   Outcome Measure  Help needed turning from your back to your side while in a flat bed without using bedrails?: None Help needed moving from lying on your back to sitting on the side of a flat bed  without using bedrails?: None Help needed moving to and from a bed to a chair (including a wheelchair)?: None Help needed standing up from a chair using your arms (e.g., wheelchair or bedside chair)?: None Help needed to walk in hospital room?: None Help needed climbing 3-5 steps with a railing? : A Little 6 Click Score: 23    End of Session   Activity Tolerance: Patient tolerated treatment well Patient left: with call bell/phone within reach;in bed Nurse Communication: Mobility status PT Visit Diagnosis: Difficulty in walking, not elsewhere classified (R26.2)     Time: 5366-4403 PT Time Calculation (min) (ACUTE ONLY): 14 min  Charges:    $Gait Training: 8-22 mins PT General Charges $$ ACUTE PT VISIT: 1 Visit                     Lenora Boys. PTA Acute Rehabilitation Services Office: [phone]   Catalina Antigua 08/04/2023, 9:09 AM

## 2023-08-04 NOTE — Discharge Summary (Addendum)
Physician Discharge Summary  Holly Nielsen NUU:[id number] DOB: [birth year] DOA: 07/30/2023  PCP: Pcp, No  Admit date: 07/30/2023  Discharge date: 08/04/2023  Admitted From:Home  Disposition:  Home  Recommendations for Outpatient Follow-up:  Follow up with PCP as scheduled on 08/07/23 Patient will need to schedule outpatient follow-up visit with allergist  Home Health: None  Equipment/Devices: None  Discharge Condition:Stable  CODE STATUS: Full  Diet recommendation: Heart Healthy  Brief/Interim Summary: Patient is a 32 year old Caucasian female, morbidly obese, with past medical history significant for Asthma, multiple allergies, pica, Guillain-Barr syndrome and iron deficiency anemia.  Patient was initially admitted to ICU team with anaphylaxis.  Patient was managed with several doses of epinephrine.  At some point, patient was on epinephrine drip.  Etiology of anaphylaxis remains unclear.  Patient has improved significantly.  Patient was eventually transferred to the hospitalist team.  Patient has continued to do well on oral prednisone.  Patient will be discharged back into the care of the primary care provider, pulmonary team and allergy team.  Patient will be discharged on prednisone 20 Mg p.o. once daily for the next 4 days, then 10 Mg p.o. once daily for 5 days and stop.   Anaphylaxis: -Etiology unclear. -Continue prednisone on discharge for the next 9 days. -Follow-up with allergy team, pulmonary team and primary care provider.   History of asthma: -Continue singular -Continue budesonide and Brovana. -Supportive care.   Hyperglycemia: -Likely steroid-induced. -Improved.   Morbid obesity: -Diet and exercise. -PCP to consider semaglutide or Mounjaro on discharge.   Consults: pulmonary/intensive care (patient was initially admitted to the ICU team).    Patient was noted to have some PVCs on EKG overnight and had some mild hypokalemia which was supplemented prior to  discharge.  TSH within normal limits and telemetry with no significant findings this a.m.  She has no other symptoms of chest pain or shortness of breath.  No indication for 2D echocardiogram.  Discharge Diagnoses:  Principal Problem:   Anaphylaxis Active Problems:   Allergic reaction    Discharge Instructions  Discharge Instructions     Diet - low sodium heart healthy   Complete by: As directed    Diet - low sodium heart healthy   Complete by: As directed    Increase activity slowly   Complete by: As directed    Increase activity slowly   Complete by: As directed       Allergies as of 08/04/2023       Reactions   Capsicum Anaphylaxis   Buspirone Diarrhea   Other Nausea And Vomiting, Diarrhea   Green bell peppers   Celery Oil Diarrhea   Iron Sucrose    Other reaction(s): Dizziness, Other (See Comments), Other - See Comments Pt became flushed, hot and diaphoretic after completion of infusion.  Pt became flushed, hot and diaphoretic after completion of infusion.    Nutricap [actical] Diarrhea   Pedi-pre Tape Spray [wound Dressing Adhesive] Hives   Anise Extract [flavoring Agent] Hives, Rash   Vanilla Extract        Medication List     STOP taking these medications    benzonatate 200 MG capsule Commonly known as: TESSALON   meloxicam 15 MG tablet Commonly known as: MOBIC   methocarbamol 500 MG tablet Commonly known as: ROBAXIN   omalizumab 150 MG/ML prefilled syringe Commonly known as: Xolair   ondansetron 4 MG disintegrating tablet Commonly known as: ZOFRAN-ODT       TAKE these medications  albuterol 108 (90 Base) MCG/ACT inhaler Commonly known as: VENTOLIN HFA Inhale 2 puffs into the lungs every 6 (six) hours as needed for wheezing.   albuterol (2.5 MG/3ML) 0.083% nebulizer solution Commonly known as: PROVENTIL Take 3 mLs by nebulization every 6 (six) hours as needed for wheezing or shortness of breath.   Banophen 25 MG tablet Generic  drug: diphenhydrAMINE Take 1 tablet (25 mg total) by mouth every 6 (six) hours as needed for allergies or itching.   cetirizine 10 MG tablet Commonly known as: ZYRTEC Take 10 mg by mouth daily.   docusate sodium 100 MG capsule Commonly known as: COLACE Take 1 capsule (100 mg total) by mouth 2 (two) times daily as needed for mild constipation.   EPINEPHrine 0.3 mg/0.3 mL Soaj injection Commonly known as: EPI-PEN Inject 0.3 mg into the muscle as needed for anaphylaxis. What changed: when to take this   Fluticasone-Salmeterol 232-14 MCG/ACT Aepb Commonly known as: AirDuo RespiClick 232/14 Inhale 1 puff into the lungs 2 (two) times daily.   ipratropium-albuterol 0.5-2.5 (3) MG/3ML Soln Commonly known as: DUONEB Take 3 mLs by nebulization every 4 (four) hours as needed.   lidocaine 5 % Commonly known as: LIDODERM Place 1 patch onto the skin daily. Remove & Discard patch within 12 hours or as directed by MD   montelukast 10 MG tablet Commonly known as: SINGULAIR Take 1 tablet (10 mg total) by mouth at bedtime.   PARoxetine 12.5 MG 24 hr tablet Commonly known as: PAXIL-CR Take 12.5 mg by mouth every morning.   polyethylene glycol powder 17 GM/SCOOP powder Commonly known as: GLYCOLAX/MIRALAX Take 17 g by mouth daily as needed for moderate constipation.   predniSONE 10 MG tablet Commonly known as: DELTASONE Take 2 tablets (20 mg total) by mouth daily for 4 days, THEN 1 tablet (10 mg total) daily for 5 days then stop Start taking on: August 04, 2023 What changed:  medication strength See the new instructions.        Follow-up Information     Donell Beers, FNP. Go on 08/07/2023.   Specialty: Nurse Practitioner Why: You are scheduled for a hospital follow up on Friday, 08/07/2023 at 10:20 am. Contact information: [address] [place], [place] [address] [phone]                Allergies  Allergen Reactions   Capsicum Anaphylaxis   Buspirone  Diarrhea   Other Nausea And Vomiting and Diarrhea    Green bell peppers   Celery Oil Diarrhea   Iron Sucrose     Other reaction(s): Dizziness, Other (See Comments), Other - See Comments Pt became flushed, hot and diaphoretic after completion of infusion.  Pt became flushed, hot and diaphoretic after completion of infusion.     Nutricap [Actical] Diarrhea   Pedi-Pre Tape Spray [Wound Dressing Adhesive] Hives   Anise Extract [Flavoring Agent] Hives and Rash    Vanilla Extract    Consultations: PCCM   Procedures/Studies: DG Chest Port 1 View  Result Date: 07/30/2023 CLINICAL DATA:  Allergic reaction EXAM: PORTABLE CHEST 1 VIEW COMPARISON:  04/02/2023 FINDINGS: The heart size and mediastinal contours are within normal limits. Both lungs are clear. The visualized skeletal structures are unremarkable. IMPRESSION: No active disease. Electronically Signed   By: Jasmine Pang M.D.   On: 07/30/2023 19:37     Discharge Exam: Vitals:   08/04/23 0733 08/04/23 0749  BP: (!) 124/90   Pulse: 78 78  Resp: 18 18  Temp: (!) 97.4 F (36.3 C)   SpO2: 97% 97%   Vitals:   08/04/23 0420 08/04/23 0442 08/04/23 0733 08/04/23 0749  BP:  124/84 (!) 124/90   Pulse:  65 78 78  Resp:  18 18 18   Temp:  98.2 F (36.8 C) (!) 97.4 F (36.3 C)   TempSrc:  Oral Oral   SpO2:  98% 97% 97%  Weight: 128.1 kg     Height:        General: Pt is alert, awake, not in acute distress Cardiovascular: RRR, S1/S2 +, no rubs, no gallops Respiratory: CTA bilaterally, no wheezing, no rhonchi Abdominal: Soft, NT, ND, bowel sounds + Extremities: no edema, no cyanosis    The results of significant diagnostics from this hospitalization (including imaging, microbiology, ancillary and laboratory) are listed below for reference.     Microbiology: Recent Results (from the past 240 hour(s))  MRSA Next Gen by PCR, Nasal     Status: None   Collection Time: 07/30/23 11:29 PM   Specimen: Nasal Mucosa; Nasal Swab   Result Value Ref Range Status   MRSA by PCR Next Gen NOT DETECTED NOT DETECTED Final    Comment: (NOTE) The GeneXpert MRSA Assay (FDA approved for NASAL specimens only), is one component of a comprehensive MRSA colonization surveillance program. It is not intended to diagnose MRSA infection nor to guide or monitor treatment for MRSA infections. Test performance is not FDA approved in patients less than 86 years old. Performed at [facility], [address], [place], [place] [address]      Labs: BNP (last 3 results) No results for input(s): "BNP" in the last 8760 hours. Basic Metabolic Panel: Recent Labs  Lab 07/30/23 1946 07/31/23 0324 08/04/23 0632  NA 138 135 136  K 3.6 3.4* 3.4*  CL 103 106 100  CO2 24 13* 26  GLUCOSE 121* 193* 107*  BUN 12 6 21*  CREATININE 0.93 1.15* 0.95  CALCIUM 8.5* 7.9* 9.1  MG  --  2.0 1.9  PHOS  --  2.0*  --    Liver Function Tests: No results for input(s): "AST", "ALT", "ALKPHOS", "BILITOT", "PROT", "ALBUMIN" in the last 168 hours. No results for input(s): "LIPASE", "AMYLASE" in the last 168 hours. No results for input(s): "AMMONIA" in the last 168 hours. CBC: Recent Labs  Lab 07/30/23 1946 07/31/23 0324  WBC 8.0 14.6*  NEUTROABS 5.0  --   HGB 12.8 12.5  HCT 41.2 40.4  MCV 81.1 81.8  PLT 229 280   Cardiac Enzymes: No results for input(s): "CKTOTAL", "CKMB", "CKMBINDEX", "TROPONINI" in the last 168 hours. BNP: Invalid input(s): "POCBNP" CBG: Recent Labs  Lab 08/03/23 1022 08/03/23 1138 08/03/23 1612 08/03/23 2113 08/04/23 0734  GLUCAP 128* 123* 118* 130* 82   D-Dimer No results for input(s): "DDIMER" in the last 72 hours. Hgb A1c No results for input(s): "HGBA1C" in the last 72 hours. Lipid Profile No results for input(s): "CHOL", "HDL", "LDLCALC", "TRIG", "CHOLHDL", "LDLDIRECT" in the last 72 hours. Thyroid function studies Recent Labs    08/04/23 0632  TSH 2.729   Anemia work up No results for input(s):  "VITAMINB12", "FOLATE", "FERRITIN", "TIBC", "IRON", "RETICCTPCT" in the last 72 hours. Urinalysis No results found for: "COLORURINE", "APPEARANCEUR", "LABSPEC", "PHURINE", "GLUCOSEU", "HGBUR", "BILIRUBINUR", "KETONESUR", "PROTEINUR", "UROBILINOGEN", "NITRITE", "LEUKOCYTESUR" Sepsis Labs Recent Labs  Lab 07/30/23 1946 07/31/23 0324  WBC 8.0 14.6*   Microbiology Recent Results (from the past 240 hour(s))  MRSA Next Gen by PCR, Nasal  Status: None   Collection Time: 07/30/23 11:29 PM   Specimen: Nasal Mucosa; Nasal Swab  Result Value Ref Range Status   MRSA by PCR Next Gen NOT DETECTED NOT DETECTED Final    Comment: (NOTE) The GeneXpert MRSA Assay (FDA approved for NASAL specimens only), is one component of a comprehensive MRSA colonization surveillance program. It is not intended to diagnose MRSA infection nor to guide or monitor treatment for MRSA infections. Test performance is not FDA approved in patients less than 76 years old. Performed at [facility], [address] [place], [place] [address]      Time coordinating discharge: 35 minutes  SIGNED:   Erick Blinks, DO [facility] 08/04/2023, 8:28 AM  If 7PM-7AM, please contact night-coverage [email or web address]

## 2023-08-04 NOTE — Progress Notes (Signed)
Night cross-coverage  Notified by RN that telemetry is reporting ventricular trigeminy.  Patient is asymptomatic and vital signs stable.  Stat EKG ordered.  Check potassium, magnesium, and TSH levels.  Echo ordered to assess for LVEF and structural abnormalities.

## 2023-08-04 NOTE — Plan of Care (Signed)
  Problem: Education: Goal: Knowledge of General Education information will improve Description: Including pain rating scale, medication(s)/side effects and non-pharmacologic comfort measures Outcome: Progressing   Problem: Health Behavior/Discharge Planning: Goal: Ability to manage health-related needs will improve Outcome: Progressing   Problem: Clinical Measurements: Goal: Ability to maintain clinical measurements within normal limits will improve Outcome: Progressing Goal: Will remain free from infection Outcome: Progressing Goal: Diagnostic test results will improve Outcome: Progressing Goal: Respiratory complications will improve Outcome: Progressing Goal: Cardiovascular complication will be avoided Outcome: Progressing   Problem: Activity: Goal: Risk for activity intolerance will decrease Outcome: Progressing   Problem: Coping: Goal: Level of anxiety will decrease Outcome: Progressing   Problem: Elimination: Goal: Will not experience complications related to bowel motility Outcome: Progressing Goal: Will not experience complications related to urinary retention Outcome: Progressing   Problem: Pain Managment: Goal: General experience of comfort will improve Outcome: Progressing   Problem: Safety: Goal: Ability to remain free from injury will improve Outcome: Progressing   Problem: Skin Integrity: Goal: Risk for impaired skin integrity will decrease Outcome: Progressing   Problem: Education: Goal: Ability to describe self-care measures that may prevent or decrease complications (Diabetes Survival Skills Education) will improve Outcome: Progressing Goal: Individualized Educational Video(s) Outcome: Progressing   Problem: Coping: Goal: Ability to adjust to condition or change in health will improve Outcome: Progressing   Problem: Fluid Volume: Goal: Ability to maintain a balanced intake and output will improve Outcome: Progressing   Problem: Health  Behavior/Discharge Planning: Goal: Ability to identify and utilize available resources and services will improve Outcome: Progressing Goal: Ability to manage health-related needs will improve Outcome: Progressing   Problem: Metabolic: Goal: Ability to maintain appropriate glucose levels will improve Outcome: Progressing   Problem: Nutritional: Goal: Maintenance of adequate nutrition will improve Outcome: Progressing Goal: Progress toward achieving an optimal weight will improve Outcome: Progressing   Problem: Skin Integrity: Goal: Risk for impaired skin integrity will decrease Outcome: Progressing   Problem: Tissue Perfusion: Goal: Adequacy of tissue perfusion will improve Outcome: Progressing   

## 2023-08-07 ENCOUNTER — Inpatient Hospital Stay: Payer: Self-pay | Admitting: Nurse Practitioner

## 2023-08-13 ENCOUNTER — Other Ambulatory Visit (HOSPITAL_COMMUNITY): Payer: Self-pay

## 2023-08-14 ENCOUNTER — Inpatient Hospital Stay: Payer: Self-pay | Admitting: Nurse Practitioner

## 2023-08-14 ENCOUNTER — Other Ambulatory Visit: Payer: Self-pay

## 2023-08-14 MED ORDER — EPINEPHRINE 0.3 MG/0.3ML IJ SOAJ
0.3000 mg | INTRAMUSCULAR | 5 refills | Status: AC | PRN
Start: 2023-08-14 — End: ?
  Filled 2023-08-14 – 2023-08-24 (×2): qty 2, 9d supply, fill #0
  Filled 2023-11-13: qty 2, 4d supply, fill #0

## 2023-08-18 ENCOUNTER — Other Ambulatory Visit: Payer: Self-pay

## 2023-08-18 MED ORDER — BENZONATATE 100 MG PO CAPS
100.0000 mg | ORAL_CAPSULE | Freq: Three times a day (TID) | ORAL | 0 refills | Status: DC | PRN
  Filled 2023-08-18: qty 30, 5d supply, fill #0

## 2023-08-24 ENCOUNTER — Other Ambulatory Visit (HOSPITAL_COMMUNITY): Payer: Self-pay

## 2023-08-25 ENCOUNTER — Other Ambulatory Visit: Payer: Self-pay

## 2023-08-26 ENCOUNTER — Emergency Department (HOSPITAL_COMMUNITY)
Admission: EM | Admit: 2023-08-26 | Discharge: 2023-08-26 | Disposition: A | Discharge status: Home / Self Care | Payer: Medicaid Other

## 2023-08-26 ENCOUNTER — Encounter (HOSPITAL_COMMUNITY): Payer: Self-pay

## 2023-08-26 ENCOUNTER — Other Ambulatory Visit: Payer: Self-pay

## 2023-08-26 ENCOUNTER — Emergency Department (HOSPITAL_COMMUNITY): Payer: Medicaid Other

## 2023-08-26 ENCOUNTER — Telehealth: Payer: Self-pay | Admitting: Pulmonary Disease

## 2023-08-26 DIAGNOSIS — Z7951 Long term (current) use of inhaled steroids: Secondary | ICD-10-CM | POA: Diagnosis not present

## 2023-08-26 DIAGNOSIS — J45909 Unspecified asthma, uncomplicated: Secondary | ICD-10-CM | POA: Diagnosis not present

## 2023-08-26 DIAGNOSIS — J40 Bronchitis, not specified as acute or chronic: Secondary | ICD-10-CM | POA: Insufficient documentation

## 2023-08-26 DIAGNOSIS — Z20822 Contact with and (suspected) exposure to covid-19: Secondary | ICD-10-CM | POA: Insufficient documentation

## 2023-08-26 DIAGNOSIS — R0602 Shortness of breath: Secondary | ICD-10-CM | POA: Diagnosis not present

## 2023-08-26 DIAGNOSIS — B349 Viral infection, unspecified: Secondary | ICD-10-CM | POA: Diagnosis not present

## 2023-08-26 DIAGNOSIS — R0789 Other chest pain: Secondary | ICD-10-CM | POA: Diagnosis not present

## 2023-08-26 DIAGNOSIS — R509 Fever, unspecified: Secondary | ICD-10-CM | POA: Diagnosis present

## 2023-08-26 LAB — CBC WITH DIFFERENTIAL/PLATELET
Abs Immature Granulocytes: 0.02 10*3/uL (ref 0.00–0.07)
Basophils Absolute: 0 10*3/uL (ref 0.0–0.1)
Basophils Relative: 0 %
Eosinophils Absolute: 0.4 10*3/uL (ref 0.0–0.5)
Eosinophils Relative: 5 %
HCT: 39.2 % (ref 36.0–46.0)
Hemoglobin: 12.4 g/dL (ref 12.0–15.0)
Immature Granulocytes: 0 %
Lymphocytes Relative: 24 %
Lymphs Abs: 1.9 10*3/uL (ref 0.7–4.0)
MCH: 26.3 pg (ref 26.0–34.0)
MCHC: 31.6 g/dL (ref 30.0–36.0)
MCV: 83.1 fL (ref 80.0–100.0)
Monocytes Absolute: 0.6 10*3/uL (ref 0.1–1.0)
Monocytes Relative: 8 %
Neutro Abs: 4.9 10*3/uL (ref 1.7–7.7)
Neutrophils Relative %: 63 %
Platelets: 262 10*3/uL (ref 150–400)
RBC: 4.72 MIL/uL (ref 3.87–5.11)
RDW: 14.8 % (ref 11.5–15.5)
WBC: 7.8 10*3/uL (ref 4.0–10.5)
nRBC: 0 % (ref 0.0–0.2)

## 2023-08-26 LAB — COMPREHENSIVE METABOLIC PANEL
ALT: 25 U/L (ref 0–44)
AST: 21 U/L (ref 15–41)
Albumin: 3.9 g/dL (ref 3.5–5.0)
Alkaline Phosphatase: 59 U/L (ref 38–126)
Anion gap: 13 (ref 5–15)
BUN: 10 mg/dL (ref 6–20)
CO2: 23 mmol/L (ref 22–32)
Calcium: 9.2 mg/dL (ref 8.9–10.3)
Chloride: 103 mmol/L (ref 98–111)
Creatinine, Ser: 0.93 mg/dL (ref 0.44–1.00)
GFR, Estimated: >60 mL/min (ref >60)
Glucose, Bld: 83 mg/dL (ref 70–99)
Potassium: 3.5 mmol/L (ref 3.5–5.1)
Sodium: 139 mmol/L (ref 135–145)
Total Bilirubin: 0.4 mg/dL (ref 0.3–1.2)
Total Protein: 7.1 g/dL (ref 6.5–8.1)

## 2023-08-26 LAB — URINALYSIS, ROUTINE W REFLEX MICROSCOPIC
Bilirubin Urine: NEGATIVE
Glucose, UA: NEGATIVE mg/dL
Ketones, ur: NEGATIVE mg/dL
Nitrite: NEGATIVE
Protein, ur: NEGATIVE mg/dL
Specific Gravity, Urine: 1.024 (ref 1.005–1.030)
pH: 6 (ref 5.0–8.0)

## 2023-08-26 LAB — D-DIMER, QUANTITATIVE: D-Dimer, Quant: 0.38 ug{FEU}/mL (ref 0.00–0.50)

## 2023-08-26 LAB — SARS CORONAVIRUS 2 BY RT PCR: SARS Coronavirus 2 by RT PCR: NEGATIVE

## 2023-08-26 LAB — TROPONIN I (HIGH SENSITIVITY): Troponin I (High Sensitivity): <2 ng/L (ref <18)

## 2023-08-26 LAB — PREGNANCY, URINE: Preg Test, Ur: NEGATIVE

## 2023-08-26 LAB — GROUP A STREP BY PCR: Group A Strep by PCR: NOT DETECTED

## 2023-08-26 MED ORDER — SODIUM CHLORIDE 0.9 % IV BOLUS
1000.0000 mL | Freq: Once | INTRAVENOUS | Status: AC
  Administered 2023-08-26: 1000 mL via INTRAVENOUS

## 2023-08-26 MED ORDER — IPRATROPIUM-ALBUTEROL 0.5-2.5 (3) MG/3ML IN SOLN
3.0000 mL | Freq: Once | RESPIRATORY_TRACT | Status: AC
  Administered 2023-08-26: 3 mL via RESPIRATORY_TRACT
  Filled 2023-08-26: qty 3

## 2023-08-26 MED ORDER — METHYLPREDNISOLONE 4 MG PO TBPK
ORAL_TABLET | Freq: Every day | ORAL | 0 refills | Status: AC
  Filled 2023-08-26: qty 21, 6d supply, fill #0

## 2023-08-26 NOTE — Discharge Instructions (Addendum)
You were seen in the ER today for evaluation of your cough and cold symptoms.  You tested negative for strep and COVID.  This is likely a viral illness.  I have prescribed you some prednisone to help with your cough and bronchitis symptoms.  I recommend trying Robitussin over-the-counter as well as other over-the-counter cough and cold medications.  Please consult your pharmacist about which medications you can take together.  Given that you have some nasal congestion, I recommend you continue taking your Flonase and also try some saline nasal rinses as well.  Make sure that you are staying well-hydrated drinking plenty of fluids, and the water.  Please make sure to follow with your primary care doctor within next few days.  If you have new concerns, new or worsening symptoms, please return to your nearest Emergency Department for reevaluation.  Contact a doctor if: Your symptoms do not get better in 2 weeks. You have trouble coughing up the mucus. Your cough keeps you awake at night. You have a fever. Get help right away if: You cough up blood. You have chest pain. You have very bad shortness of breath. You faint or keep feeling like you are going to faint. You have a very bad headache. Your fever or chills get worse. These symptoms may be an emergency. Get help right away. Call your local emergency services (911 in the [place] Do not wait to see if the symptoms will go away. Do not drive yourself to the hospital.

## 2023-08-26 NOTE — Telephone Encounter (Signed)
Called patient. She is currently in the ED. Covid test pending. Advised that would likely need prednisone taper for asthma exacerbation and maybe paxlovid depending on the recommendations of the EDP. Advised patient to schedule follow-up with me in the near future either in-person or video visit.

## 2023-08-26 NOTE — ED Provider Triage Note (Signed)
Emergency Medicine Provider Triage Evaluation Note  Holly Nielsen , a 32 y.o. female  was evaluated in triage.  Pt complains of fevers Tmax 100.46F, rhinorrrhea, nasal congestion, sore throat, wheezing. She feels like this is flaring her asthma. Was exposed to COVID on Sunday. Symptoms started on Tuesday. Last dose of ibuprofen was 4 hours pta.   Review of Systems  Positive:  Negative:   Physical Exam  BP (!) 154/96 (BP Location: Left Arm)   Pulse 100   Temp 98.4 F (36.9 C) (Oral)   Resp 18   SpO2 98%  Gen:   Awake, no distress   Resp:  Normal effort  MSK:   Moves extremities without difficulty  Other:    Medical Decision Making  Medically screening exam initiated at 12:13 PM.  Appropriate orders placed.  Dott Bluford was informed that the remainder of the evaluation will be completed by another provider, this initial triage assessment does not replace that evaluation, and the importance of remaining in the ED until their evaluation is complete.  COVID and strep ordered.   Achille Rich, [place] 08/26/23 1215

## 2023-08-26 NOTE — ED Provider Notes (Signed)
[facility] AT [facility] Provider Note   CSN: [id number] Arrival date & time: 08/26/23  1129     History Chief Complaint  Patient presents with   Fatigue    Holly Nielsen is a 32 y.o. female with history of iron-deficiency anemia and asthma presents emergency room today for evaluation after exposure to COVID.  Patient reports fevers Tmax 100.8 Fahrenheit, rhinorrhea, nasal congestion, sore throat, wheezing.  She reports that she is not having some cough and feels like she has been having some chest pain and shortness of breath.  She feels that this is flaring her asthma.  She was exposed to COVID on Sunday but started symptoms on Tuesday.  She last had ibuprofen 4 hours prior to arrival.  She denies any lateral leg swelling, OCP use, history of malignancy or any long travel.  She denies any hemoptysis as well.  Chest pain only with coughing.  She reports it feels more tight that it is painful.  She has not tried any over-the-counter cough and cold medications for symptoms.  She reports that she has tried her albuterol nebulizer but does not feel it is working much.  HPI     Home Medications Prior to Admission medications   Medication Sig Start Date End Date Taking? Authorizing Provider  albuterol (PROVENTIL) (2.5 MG/3ML) 0.083% nebulizer solution Use 3 mLs by nebulization every 6 hours as needed for wheezing or shortness of breath. 08/04/23 09/03/23  Sherryll Burger, Pratik D, DO  albuterol (VENTOLIN HFA) 108 (90 Base) MCG/ACT inhaler Inhale 2 puffs into the lungs every 6 hours as needed for wheezing. 08/04/23   Sherryll Burger, Pratik D, DO  benzonatate (TESSALON PERLES) 100 MG capsule Take 1-2 capsules (100-200 mg total) by mouth 3 (three) times daily as needed for cough. Maximum 6 caps in 24 hours 08/18/23     cetirizine (ZYRTEC) 10 MG tablet Take 10 mg by mouth daily.    [provider]  diphenhydrAMINE (BENADRYL) 25 MG tablet Take 1 tablet (25 mg total) by mouth every 6  (six) hours as needed for allergies or itching. 08/03/23   Barnetta Chapel, MD  docusate sodium (COLACE) 100 MG capsule Take 1 capsule (100 mg total) by mouth 2 (two) times daily as needed for mild constipation. 08/03/23   Berton Mount I, MD  EPINEPHrine 0.3 mg/0.3 mL IJ SOAJ injection Inject 0.3 mg into the muscle as needed for anaphylaxis. 08/14/23   Luciano Cutter, MD  Fluticasone-Salmeterol (AIRDUO RESPICLICK 232/14) [phone] MCG/ACT AEPB Inhale 1 puff into the lungs 2 (two) times daily. 12/01/22   Luciano Cutter, MD  ipratropium-albuterol (DUONEB) 0.5-2.5 (3) MG/3ML SOLN Take 3 mLs by nebulization every 4 (four) hours as needed. 04/02/23   Glyn Ade, MD  lidocaine (LIDODERM) 5 % Place 1 patch onto the skin daily. Remove & Discard patch within 12 hours or as directed by MD 08/03/23   Barnetta Chapel, MD  montelukast (SINGULAIR) 10 MG tablet Take 1 tablet (10 mg total) by mouth at bedtime. Patient not taking: Reported on 07/30/2023 04/25/22   Luciano Cutter, MD  PARoxetine (PAXIL-CR) 12.5 MG 24 hr tablet Take 12.5 mg by mouth every morning. 07/08/21   [provider]  polyethylene glycol powder (GLYCOLAX/MIRALAX) 17 GM/SCOOP powder Take 17 g by mouth daily as needed for moderate constipation. 08/03/23   Barnetta Chapel, MD      Allergies    Capsicum, Buspirone, Other, Celery oil, Iron sucrose, Nutricap [actical], Pedi-pre tape  spray [wound dressing adhesive], and Anise extract [flavoring agent]    Review of Systems   Review of Systems  Constitutional:  Positive for fever. Negative for chills.  HENT:  Positive for congestion, rhinorrhea and sore throat. Negative for ear pain and trouble swallowing.   Respiratory:  Positive for cough, chest tightness and shortness of breath.   Cardiovascular:  Positive for chest pain. Negative for palpitations and leg swelling.  Genitourinary:  Negative for dysuria, frequency, hematuria, urgency and vaginal discharge.  Musculoskeletal:   Positive for myalgias. Negative for neck pain.  Neurological:  Negative for headaches.    Physical Exam Updated Vital Signs BP (!) 127/109   Pulse 77   Temp 98.4 F (36.9 C) (Oral)   Resp 17   Ht 5' (1.524 m)   Wt 117.9 kg   LMP 08/17/2023   SpO2 98%   BMI 50.78 kg/m  Physical Exam Vitals and nursing note reviewed.  Constitutional:      General: She is not in acute distress.    Appearance: She is not toxic-appearing.  HENT:     Head: Normocephalic and atraumatic.     Right Ear: Tympanic membrane, ear canal and external ear normal.     Left Ear: Tympanic membrane, ear canal and external ear normal.     Nose:     Comments: Bilateral nasal turbinate edema and erythema with scant clear nasal discharge.    Mouth/Throat:     Mouth: Mucous membranes are moist.     Comments: No pharyngeal erythema, exudate, or edema noted.  Uvula midline.  Airway patent.  Moist mucous membranes. Eyes:     General: No scleral icterus.    Conjunctiva/sclera: Conjunctivae normal.  Cardiovascular:     Rate and Rhythm: Normal rate.  Pulmonary:     Effort: Pulmonary effort is normal. No respiratory distress.     Breath sounds: No stridor.     Comments: Lung sounds diminished at the bases however patient speaking in full sentences with ease and satting well on room air without increased work of breathing. Abdominal:     General: Abdomen is flat.     Palpations: Abdomen is soft.     Tenderness: There is no abdominal tenderness. There is no guarding or rebound.  Musculoskeletal:        General: No deformity.     Cervical back: Normal range of motion.     Right lower leg: No edema.     Left lower leg: No edema.  Skin:    General: Skin is warm and dry.  Neurological:     General: No focal deficit present.     Mental Status: She is alert.     ED Results / Procedures / Treatments   Labs (all labs ordered are listed, but only abnormal results are displayed) Labs Reviewed  URINALYSIS, ROUTINE W  REFLEX MICROSCOPIC - Abnormal; Notable for the following components:      Result Value   APPearance HAZY (*)    Hgb urine dipstick SMALL (*)    Leukocytes,Ua MODERATE (*)    Bacteria, UA RARE (*)    All other components within normal limits  SARS CORONAVIRUS 2 BY RT PCR  GROUP A STREP BY PCR  URINE CULTURE  PREGNANCY, URINE  D-DIMER, QUANTITATIVE  CBC WITH DIFFERENTIAL/PLATELET  COMPREHENSIVE METABOLIC PANEL  TROPONIN I (HIGH SENSITIVITY)    EKG EKG Interpretation Date/Time:  Wednesday August 26 2023 16:10:51 EDT Ventricular Rate:  78 PR Interval:  148 QRS Duration:  104 QT Interval:  434 QTC Calculation: 495 R Axis:   68  Text Interpretation: Sinus rhythm Low voltage, precordial leads Nonspecific T abnormalities, anterior leads Borderline prolonged QT interval Confirmed by Estanislado Pandy [phone]) on 08/26/2023 4:29:38 PM  Radiology DG Chest 2 View  Result Date: 08/26/2023 CLINICAL DATA:  Cough with fever, nasal congestion, sore throat and wheezing. EXAM: CHEST - 2 VIEW COMPARISON:  Radiographs 07/30/2023, 04/02/2023 and 12/25/2022. FINDINGS: The heart size and mediastinal contours are normal. The lungs are clear. There is no pleural effusion or pneumothorax. No acute osseous findings are identified. IMPRESSION: No active cardiopulmonary process. Electronically Signed   By: Carey Bullocks M.D.   On: 08/26/2023 12:34    Procedures Procedures   Medications Ordered in ED Medications  sodium chloride 0.9 % bolus 1,000 mL (0 mLs Intravenous Stopped 08/26/23 1809)  ipratropium-albuterol (DUONEB) 0.5-2.5 (3) MG/3ML nebulizer solution 3 mL (3 mLs Nebulization Given 08/26/23 1809)    ED Course/ Medical Decision Making/ A&P   Medical Decision Making Amount and/or Complexity of Data Reviewed Labs: ordered. Radiology: ordered.  Risk Prescription drug management.   32 y.o. female presents to the ER for evaluation of cough and cold symptoms with some chest pain and shortness of  breath. Differential diagnosis includes but is not limited to CHF, pericardial effusion/tamponade, arrhythmias, ACS, COPD, asthma, bronchitis, pneumonia, pneumothorax, PE, anemia, COVID, flu, RSV. Vital signs showed some mild tachycardia while I was in the room up to 107, blood pressure 124/96, afebrile, satting well room air without increased work of breathing. Physical exam as noted above.   COVID-negative.  Strep is negative as well.  Will continue with ordering further lab evaluation for patient's shortness of breath given that she did have some mild tachycardia making her PERC positive because of the mild tachycardia however it has improved now, and low [place] criteria.  I independently reviewed and interpreted the patient's labs.  CBC shows no leukocytosis or anemia.  CMP shows no electrolyte abnormality or LFT abnormality.  Troponin less than 2.  D-dimer is 0.38, within normal limits.  Pregnancy test is negative.  Urinalysis shows hazy urine with small amount of hemoglobin present with moderate amount leukocytes with 11-20 red blood cells and white blood cells seen with rare bacteria.  Will order urine culture.  Patient not complaining of any abdominal pain or any urinary urgency or frequency.  She is not having any flank pain as well.  She reports that she has had urinary tract infections in the past that this does not feel similar to it.  Chest x-ray shows No active cardiopulmonary process.  Per radiology interpretation.  EKG reviewed and interpreted with my attending and read as Sinus rhythm Low voltage, precordial leads Nonspecific T abnormalities, anterior leads Borderline prolonged QT interval.  Patient was given fluid bolus here and her tachycardia improved.  DuoNeb was given as well and she reports that she is feeling better.  Overall, less likely any ACS given unremarkable troponin in reassuring EKG.  Less likely any PE given the reassuring D-dimer.  Patient is not hypoxic, satting well on  room air.  I do not appreciate any significant wheezing however patient may have some bronchitis from her asthma with her cough.  No pneumonia seen on chest x-ray or pneumothorax.  She is speaking in full sentences with ease and is satting well on room air.  She does not appear in any acute distress.  Again, do not appreciate any wheezing or rhonchi or stridor.  Will prescribe her prednisone.  She reports that she has tried Robitussin in the past and it is worked really well for her.  I encouraged her to take this along with the prednisone.  We discussed cool mist humidifiers and being adherent to her inhalers and nebulizers. Work note was given for her.  We discussed the results of the labs/imaging. The plan is take prednisone as prescribed, recommended over-the-counter Robitussin as well.  We discussed return precautions and follow-up with primary care doctor.  Discussed cold medications humidifiers. We discussed strict return precautions and red flag symptoms. The patient verbalized their understanding and agrees to the plan. The patient is stable and being discharged home in good condition.  Portions of this report may have been transcribed using voice recognition software. Every effort was made to ensure accuracy; however, inadvertent computerized transcription errors may be present.   Final Clinical Impression(s) / ED Diagnoses Final diagnoses:  Viral illness  Bronchitis    Rx / DC Orders ED Discharge Orders          Ordered    methylPREDNISolone (MEDROL DOSEPAK) 4 MG TBPK tablet  Daily        Pending              Achille Rich, PA-C 08/27/23 2253    Coral Spikes, DO 08/28/23 0022

## 2023-08-26 NOTE — ED Triage Notes (Addendum)
Pt coming in complaining of fatigue, fevers, nausea, coughing. Pt states she feels like she has covid, had recent exposure.

## 2023-08-27 ENCOUNTER — Other Ambulatory Visit: Payer: Self-pay

## 2023-08-27 LAB — URINE CULTURE: Culture: 50000 — AB

## 2023-08-28 NOTE — Telephone Encounter (Signed)
Post ED Visit - Positive Culture Follow-up  Culture report reviewed by antimicrobial stewardship pharmacist: [facility] []  Enzo Bi, Pharm.D. []  Celedonio Miyamoto, Pharm.D., BCPS AQ-ID []  Garvin Fila, Pharm.D., BCPS []  Georgina Pillion, Pharm.D., BCPS []  [place], [address].D., BCPS, AAHIVP []  Estella Husk, Pharm.D., BCPS, AAHIVP []  Lysle Pearl, PharmD, BCPS []  Phillips Climes, PharmD, BCPS []  Agapito Games, PharmD, BCPS []  Verlan Friends, PharmD []  Mervyn Gay, PharmD, BCPS []  Vinnie Level, PharmD  [facility] [x]  Sharin Mons, PharmD []  Greer Pickerel, PharmD []  Adalberto Cole, PharmD []  Perlie Gold, Rph []  Lonell Face) Jean Rosenthal, PharmD []  Earl Many, PharmD []  Junita Push, PharmD []  Dorna Leitz, PharmD []  Terrilee Files, PharmD []  Lynann Beaver, PharmD []  Keturah Barre, PharmD []  Loralee Pacas, PharmD []  Bernadene Person, PharmD   Positive urine culture and patient was asymptomatic and insignificant growth.  Treated with no antibiotics per Dr Harrel Carina, Dagmar Hait 08/28/2023, 10:38 AM

## 2023-08-28 NOTE — Progress Notes (Signed)
ED Antimicrobial Stewardship Positive Culture Follow Up   Holly Nielsen is an 32 y.o. female who presented to [facility] on 08/26/2023 with a chief complaint of  Chief Complaint  Patient presents with   Fatigue    Recent Results (from the past 720 hour(s))  MRSA Next Gen by PCR, Nasal     Status: None   Collection Time: 07/30/23 11:29 PM   Specimen: Nasal Mucosa; Nasal Swab  Result Value Ref Range Status   MRSA by PCR Next Gen NOT DETECTED NOT DETECTED Final    Comment: (NOTE) The GeneXpert MRSA Assay (FDA approved for NASAL specimens only), is one component of a comprehensive MRSA colonization surveillance program. It is not intended to diagnose MRSA infection nor to guide or monitor treatment for MRSA infections. Test performance is not FDA approved in patients less than 87 years old. Performed at [facility], [address] [place], [place] [address]   SARS Coronavirus 2 by RT PCR (hospital order, performed in [facility] hospital lab) *cepheid single result test* Anterior Nasal Swab     Status: None   Collection Time: 08/26/23 11:50 AM   Specimen: Anterior Nasal Swab  Result Value Ref Range Status   SARS Coronavirus 2 by RT PCR NEGATIVE NEGATIVE Final    Comment: (NOTE) SARS-CoV-2 target nucleic acids are NOT DETECTED.  The SARS-CoV-2 RNA is generally detectable in upper and lower respiratory specimens during the acute phase of infection. The lowest concentration of SARS-CoV-2 viral copies this assay can detect is 250 copies / mL. A negative result does not preclude SARS-CoV-2 infection and should not be used as the sole basis for treatment or other patient management decisions.  A negative result may occur with improper specimen collection / handling, submission of specimen other than nasopharyngeal swab, presence of viral mutation(s) within the areas targeted by this assay, and inadequate number of viral copies (<250 copies / mL). A negative result must be  combined with clinical observations, patient history, and epidemiological information.  Fact Sheet for Patients:   RoadLapTop.co.za  Fact Sheet for Healthcare Providers: http://kim-miller.com/  This test is not yet approved or  cleared by the [place] FDA and has been authorized for detection and/or diagnosis of SARS-CoV-2 by FDA under an Emergency Use Authorization (EUA).  This EUA will remain in effect (meaning this test can be used) for the duration of the COVID-19 declaration under Section 564(b)(1) of the Act, 21 U.S.C. section 360bbb-3(b)(1), unless the authorization is terminated or revoked sooner.  Performed at [facility], [address], [place], [place] [address]   Group A Strep by PCR     Status: None   Collection Time: 08/26/23 11:50 AM   Specimen: Anterior Nasal Swab; Sterile Swab  Result Value Ref Range Status   Group A Strep by PCR NOT DETECTED NOT DETECTED Final    Comment: Performed at [facility], [address], [place], [place] [address]  Urine Culture     Status: Abnormal   Collection Time: 08/26/23  6:09 PM   Specimen: Urine, Clean Catch  Result Value Ref Range Status   Specimen Description   Final    URINE, CLEAN CATCH Performed at [facility], [address], [place], [place] [address]    Special Requests   Final    NONE Performed at [facility], [address], [place], [place] [address]    Culture (A)  Final    50,000  COLONIES/mL GROUP B STREP(S.AGALACTIAE)ISOLATED TESTING AGAINST S. AGALACTIAE NOT ROUTINELY PERFORMED DUE TO PREDICTABILITY OF AMP/PEN/VAN SUSCEPTIBILITY. Performed at [facility], [address] [place], [place] [address]    Report Status 08/27/2023 FINAL  Final   32 yo F who presented to ED with concerns of COVID due to recent exposure, respiratory symptoms, fever, no  urinary symptoms, and a  negative pregnancy test. UA had mildly elevated WBC and Urine Culture did not show significant growth. No need to treat at this time.   ED Provider: Bethann Berkshire, MD   Caprice Beaver PharmD Candidate 2025 08/28/2023 9:44 AM

## 2023-09-01 ENCOUNTER — Other Ambulatory Visit (HOSPITAL_COMMUNITY): Payer: Self-pay

## 2023-09-02 ENCOUNTER — Other Ambulatory Visit: Payer: Self-pay

## 2023-09-03 ENCOUNTER — Other Ambulatory Visit: Payer: Self-pay

## 2023-09-04 ENCOUNTER — Other Ambulatory Visit (HOSPITAL_COMMUNITY): Payer: Self-pay

## 2023-09-07 ENCOUNTER — Other Ambulatory Visit: Payer: Self-pay

## 2023-09-08 ENCOUNTER — Other Ambulatory Visit: Payer: Self-pay

## 2023-09-08 ENCOUNTER — Other Ambulatory Visit (HOSPITAL_COMMUNITY): Payer: Self-pay

## 2023-09-14 ENCOUNTER — Emergency Department (HOSPITAL_COMMUNITY): Payer: Medicaid Other

## 2023-09-14 ENCOUNTER — Other Ambulatory Visit: Payer: Self-pay

## 2023-09-14 ENCOUNTER — Encounter (HOSPITAL_COMMUNITY): Payer: Self-pay | Admitting: *Deleted

## 2023-09-14 ENCOUNTER — Emergency Department (HOSPITAL_COMMUNITY)
Admission: EM | Admit: 2023-09-14 | Discharge: 2023-09-14 | Disposition: A | Discharge status: Home / Self Care | Payer: Medicaid Other | Attending: Emergency Medicine | Admitting: Emergency Medicine

## 2023-09-14 DIAGNOSIS — R0602 Shortness of breath: Secondary | ICD-10-CM | POA: Diagnosis not present

## 2023-09-14 DIAGNOSIS — J4541 Moderate persistent asthma with (acute) exacerbation: Secondary | ICD-10-CM | POA: Diagnosis not present

## 2023-09-14 DIAGNOSIS — J069 Acute upper respiratory infection, unspecified: Secondary | ICD-10-CM | POA: Diagnosis not present

## 2023-09-14 DIAGNOSIS — Z20822 Contact with and (suspected) exposure to covid-19: Secondary | ICD-10-CM | POA: Diagnosis not present

## 2023-09-14 DIAGNOSIS — J4550 Severe persistent asthma, uncomplicated: Secondary | ICD-10-CM

## 2023-09-14 DIAGNOSIS — B9789 Other viral agents as the cause of diseases classified elsewhere: Secondary | ICD-10-CM | POA: Diagnosis not present

## 2023-09-14 LAB — BASIC METABOLIC PANEL
Anion gap: 10 (ref 5–15)
BUN: 13 mg/dL (ref 6–20)
CO2: 25 mmol/L (ref 22–32)
Calcium: 8.7 mg/dL — ABNORMAL LOW (ref 8.9–10.3)
Chloride: 102 mmol/L (ref 98–111)
Creatinine, Ser: 0.85 mg/dL (ref 0.44–1.00)
GFR, Estimated: >60 mL/min (ref >60)
Glucose, Bld: 108 mg/dL — ABNORMAL HIGH (ref 70–99)
Potassium: 3.6 mmol/L (ref 3.5–5.1)
Sodium: 137 mmol/L (ref 135–145)

## 2023-09-14 LAB — CBC
HCT: 38.7 % (ref 36.0–46.0)
Hemoglobin: 12.2 g/dL (ref 12.0–15.0)
MCH: 25.5 pg — ABNORMAL LOW (ref 26.0–34.0)
MCHC: 31.5 g/dL (ref 30.0–36.0)
MCV: 80.8 fL (ref 80.0–100.0)
Platelets: 244 10*3/uL (ref 150–400)
RBC: 4.79 MIL/uL (ref 3.87–5.11)
RDW: 14.6 % (ref 11.5–15.5)
WBC: 9.1 10*3/uL (ref 4.0–10.5)
nRBC: 0 % (ref 0.0–0.2)

## 2023-09-14 LAB — SARS CORONAVIRUS 2 BY RT PCR: SARS Coronavirus 2 by RT PCR: NEGATIVE

## 2023-09-14 MED ORDER — ALBUTEROL SULFATE HFA 108 (90 BASE) MCG/ACT IN AERS: 2.0000 | INHALATION_SPRAY | RESPIRATORY_TRACT | Status: DC | PRN

## 2023-09-14 MED ORDER — ALBUTEROL SULFATE (2.5 MG/3ML) 0.083% IN NEBU: 2.5000 mg | INHALATION_SOLUTION | RESPIRATORY_TRACT | Status: DC | PRN

## 2023-09-14 MED ORDER — ALBUTEROL SULFATE (2.5 MG/3ML) 0.083% IN NEBU
10.0000 mg | INHALATION_SOLUTION | Freq: Once | RESPIRATORY_TRACT | Status: AC
  Administered 2023-09-14: 10 mg via RESPIRATORY_TRACT
  Filled 2023-09-14: qty 12

## 2023-09-14 MED ORDER — PROMETHAZINE-DM 6.25-15 MG/5ML PO SYRP
5.0000 mL | ORAL_SOLUTION | Freq: Four times a day (QID) | ORAL | 0 refills | Status: DC | PRN
  Filled 2023-09-14 – 2023-09-24 (×2): qty 118, 6d supply, fill #0

## 2023-09-14 MED ORDER — ALBUTEROL SULFATE (2.5 MG/3ML) 0.083% IN NEBU
3.0000 mL | INHALATION_SOLUTION | Freq: Four times a day (QID) | RESPIRATORY_TRACT | 0 refills | Status: DC | PRN
  Filled 2023-09-14: qty 90, 8d supply, fill #0

## 2023-09-14 MED ORDER — ALBUTEROL SULFATE (2.5 MG/3ML) 0.083% IN NEBU
3.0000 mL | INHALATION_SOLUTION | Freq: Four times a day (QID) | RESPIRATORY_TRACT | 0 refills | Status: DC | PRN
  Filled 2023-09-14 – 2023-09-24 (×2): qty 90, 8d supply, fill #0

## 2023-09-14 MED ORDER — PROMETHAZINE-DM 6.25-15 MG/5ML PO SYRP
5.0000 mL | ORAL_SOLUTION | Freq: Four times a day (QID) | ORAL | 0 refills | Status: DC | PRN
  Filled 2023-09-14: qty 118, 6d supply, fill #0

## 2023-09-14 MED ORDER — METHYLPREDNISOLONE 4 MG PO TBPK
ORAL_TABLET | ORAL | 0 refills | Status: DC
  Filled 2023-09-14: qty 21, fill #0

## 2023-09-14 MED ORDER — DEXAMETHASONE SODIUM PHOSPHATE 10 MG/ML IJ SOLN
10.0000 mg | Freq: Once | INTRAMUSCULAR | Status: AC
  Administered 2023-09-14: 10 mg via INTRAMUSCULAR
  Filled 2023-09-14: qty 1

## 2023-09-14 MED ORDER — ALBUTEROL SULFATE HFA 108 (90 BASE) MCG/ACT IN AERS
2.0000 | INHALATION_SPRAY | Freq: Four times a day (QID) | RESPIRATORY_TRACT | 5 refills | Status: DC | PRN
  Filled 2023-09-14: qty 6.7, 10d supply, fill #0
  Filled 2023-10-01 – 2023-10-26 (×2): qty 18, 25d supply, fill #0

## 2023-09-14 MED ORDER — METHYLPREDNISOLONE 4 MG PO TBPK
ORAL_TABLET | ORAL | 0 refills | Status: DC
  Filled 2023-09-14: qty 21, 6d supply, fill #0

## 2023-09-14 NOTE — ED Provider Triage Note (Signed)
Emergency Medicine Provider Triage Evaluation Note  Holly Nielsen , a 32 y.o. female  was evaluated in triage.  Pt complains of shortness of breath, cough, chest tightness x 3-4 days. Has been trying both OTC and prescribed meds including inhalers with no relief. This morning around 3 am got significantly worse. Hx of asthma  Review of Systems  Positive: SOB, cough, chest tightness Negative: Fevers, chills  Physical Exam  BP (!) 155/77 (BP Location: Right Arm)   Pulse 96   Resp (!) 21   Ht 5' (1.524 m)   Wt 122.5 kg   LMP 08/17/2023   SpO2 95%   BMI 52.73 kg/m  Gen:   Awake, no distress   Resp:  Increased effort MSK:   Moves extremities without difficulty  Other:  Decreased breath sounds, speaking in almost full sentences  Medical Decision Making  Medically screening exam initiated at 1:31 PM.  Appropriate orders placed.  Camrin Alamin was informed that the remainder of the evaluation will be completed by another provider, this initial triage assessment does not replace that evaluation, and the importance of remaining in the ED until their evaluation is complete.  Workup initiated RT initiating continuous neb   Breken Nazari T, PA-C 09/14/23 1333

## 2023-09-14 NOTE — ED Notes (Signed)
Resp notified. He will come and start treatment.

## 2023-09-14 NOTE — ED Triage Notes (Signed)
Pt with decreased breath sounds, Lung fields tight. Shob, history of asthma, pt has had theses symptoms for a days treating with OTC meds

## 2023-09-14 NOTE — Discharge Instructions (Signed)
As we discussed, your work-up in the ER today was reassuring for acute findings.  Laboratory evaluation, x-ray imaging, and EKG did not reveal any emergent concerns.  Given that you are feeling better after a nebulizer treatment an steroids, it is reasonable for you to continue to manage your symptoms at home.  To help with this, I have refilled your nebulizer solution and given you a prescription for promethazine cough syrup which is a cough medication for you to take as prescribed as needed.  Do not drive or operate heavy machinery while taking this medication as it can be sedating.  I have also given you a prescription for a Medrol Dosepak for you to take as prescribed in its entirety for management of your persistent symptoms.  I recommend calling your primary doctor to schedule follow-up appointment in the next week.  Return if development of any new or worsening symptoms.

## 2023-09-14 NOTE — ED Provider Notes (Signed)
[place] EMERGENCY DEPARTMENT AT [facility] Provider Note   CSN: [id number] Arrival date & time: 09/14/23  1227     History  Chief Complaint  Patient presents with   Shortness of Breath   Asthma    Holly Nielsen is a 32 y.o. female.  Patient with history of asthma presents today with complaints of shortness of breath. She states that same has been ongoing for the past 2-3 days. She has been attempting to manage her symptoms with over-the-counter cold medication and her home inhalers, however she ran out of the nebulizer pods yesterday. States that she woke up at 3 am with significant wheezing and chest tightness. States this feels like her asthma, but also states she is congested like she has a URI as well. She denies any chest pain, nausea, or vomiting. She does note that she has required admission for an asthma exacerbation previously, however she does not feel as bad as she did when she required admission before. Additionally, of note patient did receive a continuous nebulizer treatment in triage prior to my evaluation and she notes that she is now feeling significantly improved.  The history is provided by the patient. No language interpreter was used.  Shortness of Breath Associated symptoms: wheezing   Asthma Associated symptoms include shortness of breath.       Home Medications Prior to Admission medications   Medication Sig Start Date End Date Taking? Authorizing Provider  albuterol (PROVENTIL) (2.5 MG/3ML) 0.083% nebulizer solution Use 3 mLs by nebulization every 6 hours as needed for wheezing or shortness of breath. 08/04/23 09/03/23  Sherryll Burger, Pratik D, DO  albuterol (VENTOLIN HFA) 108 (90 Base) MCG/ACT inhaler Inhale 2 puffs into the lungs every 6 hours as needed for wheezing. 08/04/23   Sherryll Burger, Pratik D, DO  benzonatate (TESSALON PERLES) 100 MG capsule Take 1-2 capsules (100-200 mg total) by mouth 3 (three) times daily as needed for cough. Maximum 6 caps in 24 hours  08/18/23     cetirizine (ZYRTEC) 10 MG tablet Take 10 mg by mouth daily.    [provider]  diphenhydrAMINE (BENADRYL) 25 MG tablet Take 1 tablet (25 mg total) by mouth every 6 (six) hours as needed for allergies or itching. 08/03/23   Barnetta Chapel, MD  docusate sodium (COLACE) 100 MG capsule Take 1 capsule (100 mg total) by mouth 2 (two) times daily as needed for mild constipation. 08/03/23   Berton Mount I, MD  EPINEPHrine 0.3 mg/0.3 mL IJ SOAJ injection Inject 0.3 mg into the muscle as needed for anaphylaxis. 08/14/23   Luciano Cutter, MD  Fluticasone-Salmeterol (AIRDUO RESPICLICK 232/14) [phone] MCG/ACT AEPB Inhale 1 puff into the lungs 2 (two) times daily. 12/01/22   Luciano Cutter, MD  ipratropium-albuterol (DUONEB) 0.5-2.5 (3) MG/3ML SOLN Take 3 mLs by nebulization every 4 (four) hours as needed. 04/02/23   Glyn Ade, MD  lidocaine (LIDODERM) 5 % Place 1 patch onto the skin daily. Remove & Discard patch within 12 hours or as directed by MD 08/03/23   Barnetta Chapel, MD  montelukast (SINGULAIR) 10 MG tablet Take 1 tablet (10 mg total) by mouth at bedtime. Patient not taking: Reported on 07/30/2023 04/25/22   Luciano Cutter, MD  PARoxetine (PAXIL-CR) 12.5 MG 24 hr tablet Take 12.5 mg by mouth every morning. 07/08/21   [provider]  polyethylene glycol powder (GLYCOLAX/MIRALAX) 17 GM/SCOOP powder Take 17 g by mouth daily as needed for moderate constipation. 08/03/23  Berton Mount I, MD      Allergies    Capsicum, Buspirone, Other, Celery oil, Iron sucrose, Nutricap [actical], Pedi-pre tape spray [wound dressing adhesive], and Anise extract [flavoring agent]    Review of Systems   Review of Systems  Respiratory:  Positive for shortness of breath and wheezing.   All other systems reviewed and are negative.   Physical Exam Updated Vital Signs BP (!) 155/77 (BP Location: Right Arm)   Pulse 96   Resp (!) 21   Ht 5' (1.524 m)   Wt 122.5 kg   LMP  08/17/2023   SpO2 96%   BMI 52.73 kg/m  Physical Exam Vitals and nursing note reviewed.  Constitutional:      General: She is not in acute distress.    Appearance: Normal appearance. She is normal weight. She is not ill-appearing, toxic-appearing or diaphoretic.  HENT:     Head: Normocephalic and atraumatic.  Cardiovascular:     Rate and Rhythm: Normal rate and regular rhythm.     Heart sounds: Normal heart sounds.  Pulmonary:     Effort: Pulmonary effort is normal. No respiratory distress.     Breath sounds: Wheezing present.  Abdominal:     Palpations: Abdomen is soft.     Tenderness: There is no abdominal tenderness.  Musculoskeletal:        General: Normal range of motion.     Cervical back: Normal range of motion.  Skin:    General: Skin is warm and dry.  Neurological:     General: No focal deficit present.     Mental Status: She is alert.  Psychiatric:        Mood and Affect: Mood normal.        Behavior: Behavior normal.     ED Results / Procedures / Treatments   Labs (all labs ordered are listed, but only abnormal results are displayed) Labs Reviewed  CBC - Abnormal; Notable for the following components:      Result Value   MCH 25.5 (*)    All other components within normal limits  BASIC METABOLIC PANEL - Abnormal; Notable for the following components:   Glucose, Bld 108 (*)    Calcium 8.7 (*)    All other components within normal limits  SARS CORONAVIRUS 2 BY RT PCR    EKG EKG Interpretation Date/Time:  Monday September 14 2023 12:49:10 EDT Ventricular Rate:  91 PR Interval:  122 QRS Duration:  82 QT Interval:  378 QTC Calculation: 464 R Axis:   64  Text Interpretation: Sinus rhythm with frequent Premature ventricular complexes Cannot rule out Anterior infarct , age undetermined Abnormal ECG When compared with ECG of 26-Aug-2023 16:10, PREVIOUS ECG IS PRESENT PVCs are new Confirmed by Jacalyn Lefevre [phone]) on 09/14/2023 3:28:55 PM  Radiology DG  Chest 2 View  Result Date: 09/14/2023 CLINICAL DATA:  Shortness of breath EXAM: CHEST - 2 VIEW COMPARISON:  None Available. FINDINGS: The heart size and mediastinal contours are within normal limits. Both lungs are clear. The visualized skeletal structures are unremarkable. IMPRESSION: No active cardiopulmonary disease. Electronically Signed   By: Annia Belt M.D.   On: 09/14/2023 13:54    Procedures Procedures    Medications Ordered in ED Medications  albuterol (PROVENTIL) (2.5 MG/3ML) 0.083% nebulizer solution 2.5 mg (has no administration in time range)  albuterol (PROVENTIL) (2.5 MG/3ML) 0.083% nebulizer solution 10 mg (10 mg Nebulization Given 09/14/23 1338)  dexamethasone (DECADRON) injection 10 mg (10 mg Intramuscular  Given 09/14/23 1558)    ED Course/ Medical Decision Making/ A&P                                 Medical Decision Making Amount and/or Complexity of Data Reviewed Radiology: ordered.  Risk Prescription drug management.   This patient is a 32 y.o. female who presents to the ED for concern of shortness of breath, this involves an extensive number of treatment options, and is a complaint that carries with it a high risk of complications and morbidity. The emergent differential diagnosis prior to evaluation includes, but is not limited to,  CHF, pericardial effusion/tamponade, arrhythmias, COPD, asthma, bronchitis, pneumonia, pneumothorax, PE, anemia    This is not an exhaustive differential.   Past Medical History / Co-morbidities / Social History:  has a past medical history of Asthma, Guillain-Barre (HCC), Iron deficiency anemia, and Pica.  Additional history: Chart reviewed. Pertinent results include: seen previously for similar symptoms, looks like she normally improves with a neb treatment and steroids, however has required admission previously  Physical Exam: Physical exam performed. The pertinent findings include: Per above, my physical exam was performed  after patient had received an hour-long continuous nebulizer treatment and her wheezing was only mild in the lung bases at this time.  Lab Tests: I ordered, and personally interpreted labs.  The pertinent results include: COVID-negative, no acute laboratory abnormalities.   Imaging Studies: I ordered imaging studies including CXR. I independently visualized and interpreted imaging which showed NAD. I agree with the radiologist interpretation.   Cardiac Monitoring:  The patient was maintained on a cardiac monitor.  My attending physician Dr. Particia Nearing viewed and interpreted the cardiac monitored which showed an underlying rhythm of: no STEMI. I agree with this interpretation.   Medications: I ordered medication including continuous nebulizer, decadron  for asthma/wheezing. Reevaluation of the patient after these medicines showed that the patient resolved. I have reviewed the patients home medicines and have made adjustments as needed.   Disposition: After consideration of the diagnostic results and the patients response to treatment, I feel that emergency department workup does not suggest an emergent condition requiring admission or immediate intervention beyond what has been performed at this time. The plan is: Discharge with Medrol Dosepak, promethazine cough syrup and close outpatient follow-up with return precautions. Will also refill her nebulizer pods for her machine per her request.  After interventions listed above, patient is feeling back to normal and is ready to go home.  She is able to walk around without any drop in her oxygen saturation and is no longer wheezing.  Given this with her medical history, patient's symptoms likely due to her asthma.  She may also have a URI which triggered her exacerbation. Evaluation and diagnostic testing in the emergency department does not suggest an emergent condition requiring admission or immediate intervention beyond what has been performed at this  time.  Plan for discharge with close PCP follow-up.  Patient is understanding and amenable with plan, educated on red flag symptoms that would prompt immediate return.  Patient discharged in stable condition.  Final Clinical Impression(s) / ED Diagnoses Final diagnoses:  Moderate persistent asthma with exacerbation  Viral URI with cough    Rx / DC Orders ED Discharge Orders          Ordered    albuterol (PROVENTIL) (2.5 MG/3ML) 0.083% nebulizer solution  Every 6 hours PRN  Note to Pharmacy: Dx: J45.50  Dispense one box.  Patient will need follow up appt for futher refills.  Have patient contact our office.   09/14/23 1815    promethazine-dextromethorphan (PROMETHAZINE-DM) 6.25-15 MG/5ML syrup  4 times daily PRN        09/14/23 1815    methylPREDNISolone (MEDROL DOSEPAK) 4 MG TBPK tablet        09/14/23 1815          An After Visit Summary was printed and given to the patient.     Vear Clock 09/14/23 1818    Jacalyn Lefevre, MD 09/14/23 2000

## 2023-09-15 ENCOUNTER — Ambulatory Visit: Payer: Medicaid Other | Admitting: Adult Health

## 2023-09-15 ENCOUNTER — Other Ambulatory Visit: Payer: Self-pay

## 2023-09-21 ENCOUNTER — Other Ambulatory Visit: Payer: Self-pay

## 2023-09-24 ENCOUNTER — Other Ambulatory Visit: Payer: Self-pay

## 2023-10-01 ENCOUNTER — Other Ambulatory Visit: Payer: Self-pay

## 2023-10-01 ENCOUNTER — Telehealth: Payer: Self-pay | Admitting: Adult Health

## 2023-10-01 NOTE — Telephone Encounter (Signed)
Pt calling for new prescription for neb treatment   [facility] - [facility]

## 2023-10-05 NOTE — Telephone Encounter (Signed)
ATC x1 LVM for patient to call our office back to let us know what medication patient is trying to request.

## 2023-10-06 ENCOUNTER — Emergency Department (HOSPITAL_COMMUNITY): Payer: Medicaid Other

## 2023-10-06 ENCOUNTER — Encounter (HOSPITAL_COMMUNITY): Payer: Self-pay

## 2023-10-06 ENCOUNTER — Emergency Department (HOSPITAL_COMMUNITY)
Admission: EM | Admit: 2023-10-06 | Discharge: 2023-10-06 | Disposition: A | Discharge status: Home / Self Care | Payer: Medicaid Other | Attending: Student | Admitting: Student

## 2023-10-06 ENCOUNTER — Other Ambulatory Visit (HOSPITAL_COMMUNITY): Payer: Self-pay

## 2023-10-06 ENCOUNTER — Other Ambulatory Visit: Payer: Self-pay

## 2023-10-06 DIAGNOSIS — J4550 Severe persistent asthma, uncomplicated: Secondary | ICD-10-CM | POA: Insufficient documentation

## 2023-10-06 DIAGNOSIS — S8992XA Unspecified injury of left lower leg, initial encounter: Secondary | ICD-10-CM | POA: Diagnosis present

## 2023-10-06 DIAGNOSIS — W19XXXA Unspecified fall, initial encounter: Secondary | ICD-10-CM | POA: Diagnosis not present

## 2023-10-06 DIAGNOSIS — M25562 Pain in left knee: Secondary | ICD-10-CM | POA: Diagnosis not present

## 2023-10-06 DIAGNOSIS — Z7951 Long term (current) use of inhaled steroids: Secondary | ICD-10-CM | POA: Diagnosis not present

## 2023-10-06 MED ORDER — NAPROXEN 375 MG PO TABS
375.0000 mg | ORAL_TABLET | Freq: Two times a day (BID) | ORAL | 0 refills | Status: DC
Start: 2023-10-06 — End: 2023-10-06
  Filled 2023-10-06: qty 20, 10d supply, fill #0

## 2023-10-06 MED ORDER — HYDROCODONE-ACETAMINOPHEN 5-325 MG PO TABS
1.0000 | ORAL_TABLET | Freq: Once | ORAL | Status: AC
  Administered 2023-10-06: 1 via ORAL
  Filled 2023-10-06: qty 1

## 2023-10-06 MED ORDER — KETOROLAC TROMETHAMINE 15 MG/ML IJ SOLN
15.0000 mg | Freq: Once | INTRAMUSCULAR | Status: AC
  Administered 2023-10-06: 15 mg via INTRAMUSCULAR
  Filled 2023-10-06: qty 1

## 2023-10-06 MED ORDER — ACETAMINOPHEN 500 MG PO TABS
1000.0000 mg | ORAL_TABLET | Freq: Three times a day (TID) | ORAL | 0 refills | Status: AC
Start: 2023-10-06 — End: 2023-11-05
  Filled 2023-10-06: qty 180, 30d supply, fill #0

## 2023-10-06 MED ORDER — NAPROXEN 375 MG PO TABS
375.0000 mg | ORAL_TABLET | Freq: Two times a day (BID) | ORAL | 0 refills | Status: DC
  Filled 2023-10-06: qty 20, 10d supply, fill #0

## 2023-10-06 MED ORDER — ACETAMINOPHEN 500 MG PO TABS
1000.0000 mg | ORAL_TABLET | Freq: Three times a day (TID) | ORAL | 0 refills | Status: DC
  Filled 2023-10-06: qty 180, 30d supply, fill #0

## 2023-10-06 MED ORDER — OXYCODONE HCL 5 MG PO TABS
5.0000 mg | ORAL_TABLET | Freq: Four times a day (QID) | ORAL | 0 refills | Status: DC | PRN
Start: 2023-10-06 — End: 2023-10-06
  Filled 2023-10-06: qty 10, 3d supply, fill #0

## 2023-10-06 MED ORDER — OXYCODONE HCL 5 MG PO TABS
5.0000 mg | ORAL_TABLET | Freq: Four times a day (QID) | ORAL | 0 refills | Status: DC | PRN
  Filled 2023-10-06: qty 10, 3d supply, fill #0

## 2023-10-06 NOTE — ED Provider Notes (Signed)
[place] EMERGENCY DEPARTMENT AT [facility] Provider Note  CSN: [id number] Arrival date & time: 10/06/23 0132  Chief Complaint(s) Leg Injury  HPI Nazaret Chea is a 32 y.o. female with PMH asthma, iron deficiency anemia with pica, GBS who presents emergency department for evaluation of left knee pain after a fall.  Patient states that fall occurred around midnight last night.  Patient was walking and fell at home onto the left knee.  States that she did have a previous injury to the knee and was seen an orthopedist in [place] who wanted to pursue a scope but symptoms had improved and they have since held off.  She denies associated numbness, tingling, weakness of the lower extremity.  Pain primarily worse on the medial aspect of the left knee and at the distal femur.  There is also associated swelling of the ankle.  Denies head strike or loss of consciousness.  Denies chest pain, shortness of breath, Donnell pain nausea, vomiting or other systemic symptoms.   Past Medical History Past Medical History:  Diagnosis Date   Asthma    Guillain-Barre (HCC)    Iron deficiency anemia    Pica    Patient Active Problem List   Diagnosis Date Noted   Allergic reaction 07/31/2023   Anaphylaxis 07/30/2023   URI (upper respiratory infection) 10/22/2022   Severe persistent asthma with acute bronchitis and acute exacerbation 03/13/2022   Morbid obesity with body mass index of 40.0-49.9 (HCC) 09/16/2021   Amenorrhea 09/16/2021   Anxiety and depression 09/16/2021   Cyst of thyroid 09/16/2021   History of iron deficiency anemia 09/16/2021   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   ADD (attention deficit disorder) 02/26/2018   H/O removal of thyroglossal duct cyst 12/27/2017   Unspecified asthma with (acute) exacerbation 10/15/2017   Guillain-Barre syndrome (HCC) 09/28/2017   Home Medication(s) Prior to Admission medications   Medication Sig Start Date End Date Taking? Authorizing Provider   acetaminophen (TYLENOL) 500 MG tablet Take 2 tablets (1,000 mg total) by mouth every 8 (eight) hours. 10/06/23 11/05/23 Yes Bernerd Terhune, MD  naproxen (NAPROSYN) 375 MG tablet Take 1 tablet (375 mg total) by mouth 2 (two) times daily. 10/06/23  Yes Isiaah Cuervo, MD  oxyCODONE (ROXICODONE) 5 MG immediate release tablet Take 1 tablet (5 mg total) by mouth every 6 (six) hours as needed for breakthrough pain. 10/06/23  Yes Riese Hellard, MD  albuterol (PROVENTIL) (2.5 MG/3ML) 0.083% nebulizer solution Use 3 mLs by nebulization every 6 hours as needed for wheezing or shortness of breath. 09/14/23 10/14/23  Smoot, Shawn Route, PA-C  albuterol (VENTOLIN HFA) 108 (90 Base) MCG/ACT inhaler Inhale 2 puffs into the lungs every 6 hours as needed for wheezing. 09/14/23   Smoot, Shawn Route, PA-C  benzonatate (TESSALON PERLES) 100 MG capsule Take 1-2 capsules (100-200 mg total) by mouth 3 (three) times daily as needed for cough. Maximum 6 caps in 24 hours 08/18/23     cetirizine (ZYRTEC) 10 MG tablet Take 10 mg by mouth daily.    [provider]  diphenhydrAMINE (BENADRYL) 25 MG tablet Take 1 tablet (25 mg total) by mouth every 6 (six) hours as needed for allergies or itching. 08/03/23   Barnetta Chapel, MD  docusate sodium (COLACE) 100 MG capsule Take 1 capsule (100 mg total) by mouth 2 (two) times daily as needed for mild constipation. 08/03/23   Berton Mount I, MD  EPINEPHrine 0.3 mg/0.3 mL IJ SOAJ injection Inject 0.3 mg into the muscle as  needed for anaphylaxis. 08/14/23   Luciano Cutter, MD  Fluticasone-Salmeterol (AIRDUO RESPICLICK 232/14) [phone] MCG/ACT AEPB Inhale 1 puff into the lungs 2 (two) times daily. 12/01/22   Luciano Cutter, MD  ipratropium-albuterol (DUONEB) 0.5-2.5 (3) MG/3ML SOLN Take 3 mLs by nebulization every 4 (four) hours as needed. 04/02/23   Glyn Ade, MD  lidocaine (LIDODERM) 5 % Place 1 patch onto the skin daily. Remove & Discard patch within 12 hours or as directed by  MD 08/03/23   Barnetta Chapel, MD  methylPREDNISolone (MEDROL DOSEPAK) 4 MG TBPK tablet Take as directed on package 09/14/23   Smoot, Shawn Route, PA-C  montelukast (SINGULAIR) 10 MG tablet Take 1 tablet (10 mg total) by mouth at bedtime. Patient not taking: Reported on 07/30/2023 04/25/22   Luciano Cutter, MD  PARoxetine (PAXIL-CR) 12.5 MG 24 hr tablet Take 12.5 mg by mouth every morning. 07/08/21   [provider]  polyethylene glycol powder (GLYCOLAX/MIRALAX) 17 GM/SCOOP powder Take 17 g by mouth daily as needed for moderate constipation. 08/03/23   Barnetta Chapel, MD  promethazine-dextromethorphan (PROMETHAZINE-DM) 6.25-15 MG/5ML syrup Take 5 mLs by mouth 4 (four) times daily as needed for cough. 09/14/23   Smoot, Shawn Route, PA-C                                                                                                                                    Past Surgical History Past Surgical History:  Procedure Laterality Date   APPENDECTOMY     CHOLECYSTECTOMY     thyroid cyst removal     TONSILLECTOMY     WISDOM TOOTH EXTRACTION     Family History Family History  Problem Relation Age of Onset   Asthma Father    Diabetes Mellitus I Paternal Grandmother    Stroke Paternal Grandfather     Social History Social History   Tobacco Use   Smoking status: Never   Smokeless tobacco: Never  Vaping Use   Vaping status: Never Used  Substance Use Topics   Alcohol use: Never   Drug use: Never   Allergies Capsicum, Buspirone, Other, Celery oil, Iron sucrose, Nutricap [actical], Pedi-pre tape spray [wound dressing adhesive], and Anise extract [flavoring agent]  Review of Systems Review of Systems  Musculoskeletal:  Positive for arthralgias and myalgias.    Physical Exam Vital Signs  I have reviewed the triage vital signs BP (!) 157/88 (BP Location: Right Arm)   Pulse 86   Temp 98 F (36.7 C) (Oral)   Resp 18   LMP 09/15/2023   SpO2 98%   Physical Exam Vitals and  nursing note reviewed.  Constitutional:      General: She is not in acute distress.    Appearance: She is well-developed.  HENT:     Head: Normocephalic and atraumatic.  Eyes:     Conjunctiva/sclera: Conjunctivae normal.  Cardiovascular:     Rate and  Rhythm: Normal rate and regular rhythm.     Heart sounds: No murmur heard. Pulmonary:     Effort: Pulmonary effort is normal. No respiratory distress.     Breath sounds: Normal breath sounds.  Abdominal:     Palpations: Abdomen is soft.     Tenderness: There is no abdominal tenderness.  Musculoskeletal:        General: Tenderness present. No swelling.     Cervical back: Neck supple.  Skin:    General: Skin is warm and dry.     Capillary Refill: Capillary refill takes less than 2 seconds.  Neurological:     Mental Status: She is alert.  Psychiatric:        Mood and Affect: Mood normal.     ED Results and Treatments Labs (all labs ordered are listed, but only abnormal results are displayed) Labs Reviewed - No data to display                                                                                                                        Radiology DG Ankle Complete Left  Result Date: 10/06/2023 CLINICAL DATA:  Fall and trauma to the left lower extremity. EXAM: LEFT ANKLE COMPLETE - 3+ VIEW; LEFT KNEE - COMPLETE 4+ VIEW COMPARISON:  None Available. FINDINGS: There is no evidence of fracture, dislocation, or joint effusion. There is no evidence of arthropathy or other focal bone abnormality. Soft tissues are unremarkable. IMPRESSION: Negative. Electronically Signed   By: Elgie Collard M.D.   On: 10/06/2023 02:46   DG Knee Complete 4 Views Left  Result Date: 10/06/2023 CLINICAL DATA:  Fall and trauma to the left lower extremity. EXAM: LEFT ANKLE COMPLETE - 3+ VIEW; LEFT KNEE - COMPLETE 4+ VIEW COMPARISON:  None Available. FINDINGS: There is no evidence of fracture, dislocation, or joint effusion. There is no evidence of  arthropathy or other focal bone abnormality. Soft tissues are unremarkable. IMPRESSION: Negative. Electronically Signed   By: Elgie Collard M.D.   On: 10/06/2023 02:46    Pertinent labs & imaging results that were available during my care of the patient were reviewed by me and considered in my medical decision making (see MDM for details).  Medications Ordered in ED Medications  ketorolac (TORADOL) 15 MG/ML injection 15 mg (has no administration in time range)  HYDROcodone-acetaminophen (NORCO/VICODIN) 5-325 MG per tablet 1 tablet (has no administration in time range)  Procedures .Ortho Injury Treatment  Date/Time: 10/06/2023 8:57 AM  Performed by: Glendora Score, MD Authorized by: Glendora Score, MD   Consent:    Consent obtained:  Verbal   Consent given by:  Patient   Risks discussed:  Fracture, nerve damage, restricted joint movement and vascular damage   Alternatives discussed:  No treatment and alternative treatmentInjury location: knee Location details: left knee Pre-procedure distal perfusion: normal Pre-procedure neurological function: normal Pre-procedure range of motion: reduced Immobilization: brace and crutches Post-procedure distal perfusion: normal Post-procedure neurological function: normal Post-procedure range of motion: unchanged     (including critical care time)  Medical Decision Making / ED Course   This patient presents to the ED for concern of knee pain, this involves an extensive number of treatment options, and is a complaint that carries with it a high risk of complications and morbidity.  The differential diagnosis includes fracture, hematoma, contusion, dislocation, ligamentous injury  MDM: Patient seen emergency room for evaluation of knee pain.  Physical exam with tenderness at the medial aspect of left knee,  distal femur and over the ankle.  Pulses intact.  X-ray imaging reassuringly negative for fracture or dislocation.  Patient presentation likely consistent with ligamentous injury and patient placed in knee immobilizer and given crutches for ambulation.  Patient pain controlled in the emergency department and an outpatient referral to orthopedics was sent.  At this time she does not meet inpatient criteria for admission but will require outpatient orthopedic follow-up.  Given return precautions which she voiced understanding she was discharged.   Additional history obtained:  -External records from outside source obtained and reviewed including: Chart review including previous notes, labs, imaging, consultation notes    Imaging Studies ordered: I ordered imaging studies including x-ray knee I independently visualized and interpreted imaging. I agree with the radiologist interpretation   Medicines ordered and prescription drug management: Meds ordered this encounter  Medications   ketorolac (TORADOL) 15 MG/ML injection 15 mg   HYDROcodone-acetaminophen (NORCO/VICODIN) 5-325 MG per tablet 1 tablet   oxyCODONE (ROXICODONE) 5 MG immediate release tablet    Sig: Take 1 tablet (5 mg total) by mouth every 6 (six) hours as needed for breakthrough pain.    Dispense:  10 tablet    Refill:  0   naproxen (NAPROSYN) 375 MG tablet    Sig: Take 1 tablet (375 mg total) by mouth 2 (two) times daily.    Dispense:  20 tablet    Refill:  0   acetaminophen (TYLENOL) 500 MG tablet    Sig: Take 2 tablets (1,000 mg total) by mouth every 8 (eight) hours.    Dispense:  180 tablet    Refill:  0    -I have reviewed the patients home medicines and have made adjustments as needed  Critical interventions none  Social Determinants of Health:  Factors impacting patients care include: none   Reevaluation: After the interventions noted above, I reevaluated the patient and found that they have :improved  Co  morbidities that complicate the patient evaluation  Past Medical History:  Diagnosis Date   Asthma    Guillain-Barre (HCC)    Iron deficiency anemia    Pica       Dispostion: I considered admission for this patient, but at this time she does not meet inpatient criteria for admission she is safe for discharge with outpatient follow-up     Final Clinical Impression(s) / ED Diagnoses Final diagnoses:  Acute pain of left knee     @  Charlyne Petrin, MD 10/06/23 [phone]

## 2023-10-06 NOTE — Discharge Instructions (Signed)
For pain:  - Acetaminophen 1000 mg three times daily (every 8 hours) - Naproxen 2 times daily (every 12 hours) - oxycodone for breakthrough pain only 

## 2023-10-06 NOTE — ED Triage Notes (Signed)
Pt arrived from home via POV s/p fall c/o left knee pain 10/10, unable to place any pressure on knee. Pt also c/o limited ROM and bruising from fall on left ankle '

## 2023-10-07 ENCOUNTER — Other Ambulatory Visit: Payer: Self-pay

## 2023-10-09 NOTE — Telephone Encounter (Signed)
Attempted to call pt but unable to reach. Left message to return call.  Due to multiple attempts trying to reach pt and unable to do so, per protocol encounter will be closed. 

## 2023-10-12 ENCOUNTER — Observation Stay (HOSPITAL_COMMUNITY)
Admission: EM | Admit: 2023-10-12 | Discharge: 2023-10-14 | Disposition: A | Discharge status: Home / Self Care | Payer: Medicaid Other | Attending: Student | Admitting: Student

## 2023-10-12 ENCOUNTER — Other Ambulatory Visit: Payer: Self-pay

## 2023-10-12 ENCOUNTER — Telehealth: Payer: Self-pay | Admitting: Adult Health

## 2023-10-12 ENCOUNTER — Emergency Department (HOSPITAL_COMMUNITY): Payer: Medicaid Other

## 2023-10-12 ENCOUNTER — Encounter (HOSPITAL_COMMUNITY): Payer: Self-pay | Admitting: Emergency Medicine

## 2023-10-12 DIAGNOSIS — R9431 Abnormal electrocardiogram [ECG] [EKG]: Secondary | ICD-10-CM | POA: Diagnosis not present

## 2023-10-12 DIAGNOSIS — T782XXA Anaphylactic shock, unspecified, initial encounter: Principal | ICD-10-CM | POA: Insufficient documentation

## 2023-10-12 DIAGNOSIS — J4550 Severe persistent asthma, uncomplicated: Secondary | ICD-10-CM | POA: Insufficient documentation

## 2023-10-12 DIAGNOSIS — Z6841 Body Mass Index (BMI) 40.0 and over, adult: Secondary | ICD-10-CM

## 2023-10-12 DIAGNOSIS — T7840XA Allergy, unspecified, initial encounter: Principal | ICD-10-CM

## 2023-10-12 DIAGNOSIS — Z79899 Other long term (current) drug therapy: Secondary | ICD-10-CM | POA: Diagnosis not present

## 2023-10-12 LAB — CBC WITH DIFFERENTIAL/PLATELET
Abs Immature Granulocytes: 0.02 10*3/uL (ref 0.00–0.07)
Basophils Absolute: 0.1 10*3/uL (ref 0.0–0.1)
Basophils Relative: 1 %
Eosinophils Absolute: 0.4 10*3/uL (ref 0.0–0.5)
Eosinophils Relative: 5 %
HCT: 40.5 % (ref 36.0–46.0)
Hemoglobin: 12.7 g/dL (ref 12.0–15.0)
Immature Granulocytes: 0 %
Lymphocytes Relative: 33 %
Lymphs Abs: 2.7 10*3/uL (ref 0.7–4.0)
MCH: 25.4 pg — ABNORMAL LOW (ref 26.0–34.0)
MCHC: 31.4 g/dL (ref 30.0–36.0)
MCV: 81 fL (ref 80.0–100.0)
Monocytes Absolute: 0.6 10*3/uL (ref 0.1–1.0)
Monocytes Relative: 7 %
Neutro Abs: 4.5 10*3/uL (ref 1.7–7.7)
Neutrophils Relative %: 54 %
Platelets: 260 10*3/uL (ref 150–400)
RBC: 5 MIL/uL (ref 3.87–5.11)
RDW: 14.7 % (ref 11.5–15.5)
WBC: 8.3 10*3/uL (ref 4.0–10.5)
nRBC: 0 % (ref 0.0–0.2)

## 2023-10-12 LAB — BASIC METABOLIC PANEL
Anion gap: 12 (ref 5–15)
BUN: 12 mg/dL (ref 6–20)
CO2: 22 mmol/L (ref 22–32)
Calcium: 9 mg/dL (ref 8.9–10.3)
Chloride: 103 mmol/L (ref 98–111)
Creatinine, Ser: 0.93 mg/dL (ref 0.44–1.00)
GFR, Estimated: >60 mL/min (ref >60)
Glucose, Bld: 128 mg/dL — ABNORMAL HIGH (ref 70–99)
Potassium: 3.9 mmol/L (ref 3.5–5.1)
Sodium: 137 mmol/L (ref 135–145)

## 2023-10-12 MED ORDER — ALBUTEROL SULFATE (2.5 MG/3ML) 0.083% IN NEBU
3.0000 mL | INHALATION_SOLUTION | Freq: Four times a day (QID) | RESPIRATORY_TRACT | Status: DC | PRN
  Administered 2023-10-12: 3 mL via RESPIRATORY_TRACT
  Filled 2023-10-12: qty 3

## 2023-10-12 MED ORDER — FAMOTIDINE IN NACL 20-0.9 MG/50ML-% IV SOLN: 20.0000 mg | INTRAVENOUS | Status: DC

## 2023-10-12 MED ORDER — EPINEPHRINE 0.3 MG/0.3ML IJ SOAJ
0.3000 mg | Freq: Once | INTRAMUSCULAR | Status: AC
  Administered 2023-10-12: 0.3 mg via INTRAMUSCULAR
  Filled 2023-10-12: qty 0.3

## 2023-10-12 MED ORDER — METHYLPREDNISOLONE SODIUM SUCC 125 MG IJ SOLR
80.0000 mg | INTRAMUSCULAR | Status: DC
  Administered 2023-10-12: 80 mg via INTRAVENOUS
  Filled 2023-10-12: qty 2

## 2023-10-12 MED ORDER — ALBUTEROL SULFATE (2.5 MG/3ML) 0.083% IN NEBU: 10.0000 mg/h | INHALATION_SOLUTION | RESPIRATORY_TRACT | Status: DC

## 2023-10-12 MED ORDER — FAMOTIDINE IN NACL 20-0.9 MG/50ML-% IV SOLN
20.0000 mg | Freq: Once | INTRAVENOUS | Status: AC
  Administered 2023-10-12: 20 mg via INTRAVENOUS
  Filled 2023-10-12: qty 50

## 2023-10-12 MED ORDER — ENOXAPARIN SODIUM 40 MG/0.4ML IJ SOSY
40.0000 mg | PREFILLED_SYRINGE | INTRAMUSCULAR | Status: DC
  Administered 2023-10-13: 40 mg via SUBCUTANEOUS
  Filled 2023-10-12 (×2): qty 0.4

## 2023-10-12 MED ORDER — DIPHENHYDRAMINE HCL 50 MG/ML IJ SOLN: 25.0000 mg | Freq: Three times a day (TID) | INTRAMUSCULAR | Status: DC

## 2023-10-12 MED ORDER — ACETAMINOPHEN 650 MG RE SUPP: 650.0000 mg | Freq: Four times a day (QID) | RECTAL | Status: DC | PRN

## 2023-10-12 MED ORDER — ACETAMINOPHEN 325 MG PO TABS
650.0000 mg | ORAL_TABLET | Freq: Four times a day (QID) | ORAL | Status: DC | PRN
  Administered 2023-10-12 – 2023-10-13 (×2): 650 mg via ORAL
  Filled 2023-10-12 (×2): qty 2

## 2023-10-12 MED ORDER — DIPHENHYDRAMINE HCL 25 MG PO CAPS
25.0000 mg | ORAL_CAPSULE | Freq: Three times a day (TID) | ORAL | Status: DC
  Administered 2023-10-12 – 2023-10-13 (×4): 25 mg via ORAL
  Filled 2023-10-12 (×4): qty 1

## 2023-10-12 MED ORDER — PREDNISONE 10 MG PO TABS
50.0000 mg | ORAL_TABLET | Freq: Every day | ORAL | Status: DC
  Administered 2023-10-12 – 2023-10-14 (×3): 50 mg via ORAL
  Filled 2023-10-12: qty 5
  Filled 2023-10-12: qty 2
  Filled 2023-10-12: qty 5

## 2023-10-12 MED ORDER — MOMETASONE FURO-FORMOTEROL FUM 200-5 MCG/ACT IN AERO
2.0000 | INHALATION_SPRAY | Freq: Two times a day (BID) | RESPIRATORY_TRACT | Status: DC
  Administered 2023-10-12 – 2023-10-14 (×5): 2 via RESPIRATORY_TRACT
  Filled 2023-10-12: qty 8.8

## 2023-10-12 MED ORDER — FAMOTIDINE 20 MG PO TABS
20.0000 mg | ORAL_TABLET | Freq: Two times a day (BID) | ORAL | Status: DC
  Administered 2023-10-12 – 2023-10-14 (×5): 20 mg via ORAL
  Filled 2023-10-12 (×6): qty 1

## 2023-10-12 MED ORDER — IPRATROPIUM BROMIDE 0.02 % IN SOLN
0.5000 mg | Freq: Once | RESPIRATORY_TRACT | Status: AC
  Administered 2023-10-12: 0.5 mg via RESPIRATORY_TRACT
  Filled 2023-10-12: qty 2.5

## 2023-10-12 NOTE — ED Notes (Signed)
Provider notified that patients eyes are itching again.

## 2023-10-12 NOTE — ED Provider Notes (Signed)
[facility] Emergency Department Provider Note MRN:  [id number]  Arrival date & time: 10/12/23     Chief Complaint   Allergic Reaction   History of Present Illness   Holly Nielsen is a 32 y.o. year-old female presents to the ED with chief complaint of allergic reaction.  Reports being in normal state of health before bed and then woke up with throat swelling sensation, wheezing, and facial swelling.  Was admitted for the same to ICU on epi infusion for anaphylaxis.  Unclear source at that time and today.  Patient received albuterol, 0.3mg  epi IM, 50mg  Benadryl, 125mg  solumedrol, 2g mag by EMS.  Patient reports some improvement and has had improvement of the facial swelling, but still reports sob and throat tightness sensation.  History provided by patient.   Review of Systems  Pertinent positive and negative review of systems noted in HPI.    Physical Exam   Vitals:   10/12/23 0115 10/12/23 0230  BP: (!) 130/95 (!) 140/116  Pulse: 97 78  Resp: (!) 27 18  Temp:    SpO2: 98% 98%    CONSTITUTIONAL:  anxious-appearing, NAD NEURO:  Alert and oriented x 3, CN 3-12 grossly intact EYES:  eyes equal and reactive ENT/NECK:  Supple, no stridor  CARDIO:  tachycardic, regular rhythm, appears well-perfused  PULM:  mildly increased WOB, end expiratory wheezes GI/GU:  non-distended,  MSK/SPINE:  No gross deformities, no edema, moves all extremities  SKIN:  no rash, atraumatic   *Additional and/or pertinent findings included in MDM below  Diagnostic and Interventional Summary    EKG Interpretation Date/Time:    Ventricular Rate:    PR Interval:    QRS Duration:    QT Interval:    QTC Calculation:   R Axis:      Text Interpretation:         Labs Reviewed  CBC WITH DIFFERENTIAL/PLATELET - Abnormal; Notable for the following components:      Result Value   MCH 25.4 (*)    All other components within normal limits  BASIC METABOLIC PANEL - Abnormal;  Notable for the following components:   Glucose, Bld 128 (*)    All other components within normal limits    DG Chest Port 1 View  Final Result      Medications  albuterol (PROVENTIL) (2.5 MG/3ML) 0.083% nebulizer solution (has no administration in time range)  famotidine (PEPCID) IVPB 20 mg premix (has no administration in time range)  EPINEPHrine (EPI-PEN) injection 0.3 mg (0.3 mg Intramuscular Given 10/12/23 0102)  ipratropium (ATROVENT) nebulizer solution 0.5 mg (0.5 mg Nebulization Given 10/12/23 0102)     Procedures  /  Critical Care .Critical Care  Performed by: Roxy Horseman, PA-C Authorized by: Roxy Horseman, PA-C   Critical care provider statement:    Critical care time (minutes):  46   Critical care was necessary to treat or prevent imminent or life-threatening deterioration of the following conditions: allergic reaction.   Critical care was time spent personally by me on the following activities:  Development of treatment plan with patient or surrogate, discussions with consultants, evaluation of patient's response to treatment, examination of patient, ordering and review of laboratory studies, ordering and review of radiographic studies, ordering and performing treatments and interventions, pulse oximetry, re-evaluation of patient's condition and review of old charts   ED Course and Medical Decision Making  I have reviewed the triage vital signs, the nursing notes, and pertinent available records from the EMR.  Social Determinants Affecting Complexity of Care: Patient has no clinically significant social determinants affecting this chief complaint..   ED Course: Clinical Course as of 10/12/23 0244  Mon Oct 12, 2023  0105 I was called to the bedside for allergic reaction.  Patient still wheezing and complaining of throat tightness sensation despite interventions listed in HPI.  Feel that patient will need additional epi.  I asked Dr. Manus Gunning to see the patient  and he agrees.  Recommends repeat epipen and continuous albuterol. [RB]    Clinical Course User Index [RB] Roxy Horseman, PA-C    Medical Decision Making Patient here with allergic reaction.  Had facial swelling, throat irritation and wheezing.  Has hx of similar and required icu and epi infusion.  Received IM epi and other standard interventions as listed in HPI with some improvement.  Still had some wheezing and was given additional IM epi and albuterol.  Had additional improvement, but given that she required repeat epi, feel that she should be admitted for further observation.  Amount and/or Complexity of Data Reviewed Labs: ordered. Radiology: ordered. ECG/medicine tests: ordered.  Risk Prescription drug management. Decision regarding hospitalization.         Consultants: I consulted with Hospitalist, Dr. Loney Loh, who is appreciated for admitting.   Treatment and Plan: Patient's exam and diagnostic results are concerning for allergic reaction.  Feel that patient will need admission to the hospital for further treatment and evaluation.  Patient seen by and discussed with attending physician, Dr. Manus Gunning, who agrees with plan.  Final Clinical Impressions(s) / ED Diagnoses     ICD-10-CM   1. Allergic reaction, initial encounter  T78.40XA       ED Discharge Orders     None         Discharge Instructions Discussed with and Provided to Patient:   Discharge Instructions   None      Roxy Horseman, PA-C 10/12/23 0247    Glynn Octave, MD 10/12/23 [phone]

## 2023-10-12 NOTE — Progress Notes (Signed)
PROGRESS NOTE    Holly Nielsen  AOZ:[id number] DOB: [birth year] DOA: 10/12/2023 PCP: Pcp, No  Subjective: Pt seen and examined. Breathing better. Still with sore throat. No difficulty swallowing.  Has never been referred to allergy/immunology. Has never been dx with HAE   Hospital Course: HPI: Holly Nielsen is a 32 y.o. female with medical history significant of asthma, multiple allergies, pica, Guillain-Barr syndrome, iron deficiency anemia, morbid obesity.  She required ICU admission in August 2024 for anaphylaxis and required epinephrine drip.  Etiology of anaphylaxis was unclear.  Patient presents to the ED via EMS tonight for evaluation of acute onset throat swelling sensation, wheezing, and facial swelling.  She was given 10 mg albuterol, 0.5 mg Atrovent, 0.3 mg IM epinephrine, 50 mg Benadryl, 125 mg Solu-Medrol, and 2 g IV mag by EMS.  She reported improvement of her facial swelling on arrival to the ED but still complained of shortness of breath, throat tightness sensation, and itchy eyes.  Tachycardic and tachypneic on arrival.  Not hypoxic.  She was given additional IM epinephrine 0.3 mg and also given ipratropium neb and IV Pepcid 20 mg.  No significant abnormalities on labs including CBC and BMP.  Chest x-ray showing no active disease.  TRH called to admit.   Patient states as she was getting ready to go to bed tonight, all of a sudden her throat felt tight and she felt short of breath and started wheezing.  She went to the bathroom and saw that her eyes were puffy and also itching.  She was not sure if her symptoms were related to allergic reaction or asthma so she did not use her home EpiPen.  Her boyfriend called EMS.  She reports history of multiple environmental and food allergies.  She is seen by an allergist and her next appointment is in November.  Patient is not sure what triggered her symptoms tonight as she did not try eating anything new.  She reports significant  improvement of her symptoms after receiving medications by EMS and in the ED.  No longer wheezing or feeling short of breath.  Denies chest pain.  No other complaints.    Significant Events: Admitted 10/12/2023 for recurrent anaphylaxis Given IM epi x by EMS and IM epi by EDP  Significant Labs:   Significant Imaging Studies: CXR negative  Antibiotic Therapy: Anti-infectives (From admission, onward)    None       Procedures:   Consultants:    Assessment and Plan: * Anaphylaxis Admitted for repeated bouts of anaphylaxis without a cause. Has been seeing pulmonary APP for allergic asthma. Has yet to have formal allergist consultation. She needs workup for HAE. Preferably at [facility] center where they have more expertise in the field. Pt able to tolerate liquids. Have changed to solid food. Change to po meds. Stop IV pepcid, steroids, etc.  Monitor overnight. Changed to med/surg bed.  Severe persistent asthma Sees pulmonology for Xolair injections. Stable. No wheezing.  Morbid obesity with BMI of 50.0-59.9, adult (HCC) BMI 52.5   DVT prophylaxis: enoxaparin (LOVENOX) injection 40 mg Start: 10/12/23 1400    Code Status: Full Code Family Communication: no family at bedside. She is decisional Disposition Plan: return home Reason for continuing need for hospitalization: monitoring overnight for rebound anaphylaxis.  Objective: Vitals:   10/12/23 1200 10/12/23 1230 10/12/23 1300 10/12/23 1336  BP: (!) 123/104 123/81 132/77   Pulse: 86 95 98   Resp: 17 19 15    Temp:    98.1  F (36.7 C)  TempSrc:    Oral  SpO2: 95% 97% 95%   Weight:      Height:        Intake/Output Summary (Last 24 hours) at 10/12/2023 1456 Last data filed at 10/12/2023 0354 Gross per 24 hour  Intake 50 ml  Output --  Net 50 ml   Filed Weights   10/12/23 0044  Weight: 122 kg    Examination:  Physical Exam Vitals and nursing note reviewed.  Constitutional:      General: She is  not in acute distress.    Appearance: She is obese. She is not toxic-appearing or diaphoretic.  HENT:     Head: Normocephalic and atraumatic.     Nose: Nose normal.  Eyes:     General: No scleral icterus. Cardiovascular:     Rate and Rhythm: Normal rate and regular rhythm.     Pulses: Normal pulses.  Pulmonary:     Effort: Pulmonary effort is normal.     Breath sounds: Normal breath sounds.  Abdominal:     General: Bowel sounds are normal.     Palpations: Abdomen is soft.  Musculoskeletal:     Right lower leg: No edema.     Left lower leg: No edema.  Skin:    General: Skin is warm and dry.     Capillary Refill: Capillary refill takes less than 2 seconds.  Neurological:     General: No focal deficit present.     Mental Status: She is alert and oriented to person, place, and time.    Data Reviewed: I have personally reviewed following labs and imaging studies  CBC: Recent Labs  Lab 10/12/23 0111  WBC 8.3  NEUTROABS 4.5  HGB 12.7  HCT 40.5  MCV 81.0  PLT 260   Basic Metabolic Panel: Recent Labs  Lab 10/12/23 0111  NA 137  K 3.9  CL 103  CO2 22  GLUCOSE 128*  BUN 12  CREATININE 0.93  CALCIUM 9.0   GFR: Estimated Creatinine Clearance: 105.3 mL/min (by C-G formula based on SCr of 0.93 mg/dL).  Radiology Studies: DG Chest Port 1 View  Result Date: 10/12/2023 CLINICAL DATA:  Allergic reaction. Facial swelling, difficulty breathing. EXAM: PORTABLE CHEST 1 VIEW COMPARISON:  09/14/2023 FINDINGS: Heart and mediastinal contours are within normal limits. No focal opacities or effusions. No acute bony abnormality. IMPRESSION: No active disease. Electronically Signed   By: Charlett Nose M.D.   On: 10/12/2023 01:31    Scheduled Meds:  diphenhydrAMINE  25 mg Oral TID   enoxaparin (LOVENOX) injection  40 mg Subcutaneous Q24H   famotidine  20 mg Oral BID   mometasone-formoterol  2 puff Inhalation BID   predniSONE  50 mg Oral Q breakfast   Continuous Infusions:   LOS:  0 days   Time spent: 30 minutes  Carollee Herter, DO  [facility]  10/12/2023, 2:56 PM

## 2023-10-12 NOTE — Assessment & Plan Note (Signed)
Sees pulmonology for Xolair injections. Stable. No wheezing.

## 2023-10-12 NOTE — H&P (Signed)
History and Physical    Holly Nielsen XLK:[id number] DOB: April 06, 1991 DOA: 10/12/2023  PCP: Pcp, No  Patient coming from: Home  Chief Complaint: Allergic reaction  HPI: Holly Nielsen is a 32 y.o. female with medical history significant of asthma, multiple allergies, pica, Guillain-Barr syndrome, iron deficiency anemia, morbid obesity.  She required ICU admission in August 2024 for anaphylaxis and required epinephrine drip.  Etiology of anaphylaxis was unclear.  Patient presents to the ED via EMS tonight for evaluation of acute onset throat swelling sensation, wheezing, and facial swelling.  She was given 10 mg albuterol, 0.5 mg Atrovent, 0.3 mg IM epinephrine, 50 mg Benadryl, 125 mg Solu-Medrol, and 2 g IV mag by EMS.  She reported improvement of her facial swelling on arrival to the ED but still complained of shortness of breath, throat tightness sensation, and itchy eyes.  Tachycardic and tachypneic on arrival.  Not hypoxic.  She was given additional IM epinephrine 0.3 mg and also given ipratropium neb and IV Pepcid 20 mg.  No significant abnormalities on labs including CBC and BMP.  Chest x-ray showing no active disease.  TRH called to admit.  Patient states as she was getting ready to go to bed tonight, all of a sudden her throat felt tight and she felt short of breath and started wheezing.  She went to the bathroom and saw that her eyes were puffy and also itching.  She was not sure if her symptoms were related to allergic reaction or asthma so she did not use her home EpiPen.  Her boyfriend called EMS.  She reports history of multiple environmental and food allergies.  She is seen by an allergist and her next appointment is in November.  Patient is not sure what triggered her symptoms tonight as she did not try eating anything new.  She reports significant improvement of her symptoms after receiving medications by EMS and in the ED.  No longer wheezing or feeling short of breath.  Denies chest  pain.  No other complaints.  Review of Systems:  Review of Systems  All other systems reviewed and are negative.   Past Medical History:  Diagnosis Date   Asthma    Guillain-Barre (HCC)    Iron deficiency anemia    Pica     Past Surgical History:  Procedure Laterality Date   APPENDECTOMY     CHOLECYSTECTOMY     thyroid cyst removal     TONSILLECTOMY     WISDOM TOOTH EXTRACTION       reports that she has never smoked. She has never used smokeless tobacco. She reports that she does not drink alcohol and does not use drugs.  Allergies  Allergen Reactions   Capsicum Anaphylaxis   Penicillins Anaphylaxis   Buspirone Diarrhea   Other Nausea And Vomiting and Diarrhea    Green bell peppers   Celery Oil Diarrhea   Iron Sucrose     Other reaction(s): Dizziness, Other (See Comments), Other - See Comments Pt became flushed, hot and diaphoretic after completion of infusion.  Pt became flushed, hot and diaphoretic after completion of infusion.     Nutricap [Actical] Diarrhea   Pedi-Pre Tape Spray [Wound Dressing Adhesive] Hives   Anise Extract [Flavoring Agent] Hives and Rash    Vanilla Extract    Family History  Problem Relation Age of Onset   Asthma Father    Diabetes Mellitus I Paternal Grandmother    Stroke Paternal Grandfather     Prior to Admission medications  Medication Sig Start Date End Date Taking? Authorizing Provider  acetaminophen (TYLENOL) 500 MG tablet Take 2 tablets (1,000 mg total) by mouth every 8 (eight) hours. 10/06/23 11/05/23  Kommor, Madison, MD  albuterol (PROVENTIL) (2.5 MG/3ML) 0.083% nebulizer solution Use 3 mLs by nebulization every 6 hours as needed for wheezing or shortness of breath. 09/14/23 10/14/23  Smoot, Shawn Route, PA-C  albuterol (VENTOLIN HFA) 108 (90 Base) MCG/ACT inhaler Inhale 2 puffs into the lungs every 6 hours as needed for wheezing. 09/14/23   Smoot, Shawn Route, PA-C  benzonatate (TESSALON PERLES) 100 MG capsule Take 1-2 capsules  (100-200 mg total) by mouth 3 (three) times daily as needed for cough. Maximum 6 caps in 24 hours 08/18/23     cetirizine (ZYRTEC) 10 MG tablet Take 10 mg by mouth daily.    [provider]  diphenhydrAMINE (BENADRYL) 25 MG tablet Take 1 tablet (25 mg total) by mouth every 6 (six) hours as needed for allergies or itching. 08/03/23   Barnetta Chapel, MD  docusate sodium (COLACE) 100 MG capsule Take 1 capsule (100 mg total) by mouth 2 (two) times daily as needed for mild constipation. 08/03/23   Berton Mount I, MD  EPINEPHrine 0.3 mg/0.3 mL IJ SOAJ injection Inject 0.3 mg into the muscle as needed for anaphylaxis. 08/14/23   Luciano Cutter, MD  Fluticasone-Salmeterol (AIRDUO RESPICLICK 232/14) [phone] MCG/ACT AEPB Inhale 1 puff into the lungs 2 (two) times daily. 12/01/22   Luciano Cutter, MD  ipratropium-albuterol (DUONEB) 0.5-2.5 (3) MG/3ML SOLN Take 3 mLs by nebulization every 4 (four) hours as needed. 04/02/23   Glyn Ade, MD  lidocaine (LIDODERM) 5 % Place 1 patch onto the skin daily. Remove & Discard patch within 12 hours or as directed by MD 08/03/23   Barnetta Chapel, MD  methylPREDNISolone (MEDROL DOSEPAK) 4 MG TBPK tablet Take as directed on package 09/14/23   Smoot, Shawn Route, PA-C  montelukast (SINGULAIR) 10 MG tablet Take 1 tablet (10 mg total) by mouth at bedtime. Patient not taking: Reported on 07/30/2023 04/25/22   Luciano Cutter, MD  naproxen (NAPROSYN) 375 MG tablet Take 1 tablet (375 mg total) by mouth 2 (two) times daily. 10/06/23   Kommor, Madison, MD  oxyCODONE (ROXICODONE) 5 MG immediate release tablet Take 1 tablet (5 mg total) by mouth every 6 (six) hours as needed for breakthrough pain. 10/06/23   Kommor, Madison, MD  PARoxetine (PAXIL-CR) 12.5 MG 24 hr tablet Take 12.5 mg by mouth every morning. 07/08/21   [provider]  polyethylene glycol powder (GLYCOLAX/MIRALAX) 17 GM/SCOOP powder Take 17 g by mouth daily as needed for moderate constipation.  08/03/23   Barnetta Chapel, MD  promethazine-dextromethorphan (PROMETHAZINE-DM) 6.25-15 MG/5ML syrup Take 5 mLs by mouth 4 (four) times daily as needed for cough. 09/14/23   Silva Bandy, PA-C    Physical Exam: Vitals:   10/12/23 0045 10/12/23 0100 10/12/23 0115 10/12/23 0230  BP: (!) 144/92 (!) 112/92 (!) 130/95 (!) 140/116  Pulse: (!) 105 96 97 78  Resp: (!) 28 18 (!) 27 18  Temp:      TempSrc:      SpO2: 97% 95% 98% 98%  Weight:      Height:        Physical Exam Vitals reviewed.  Constitutional:      General: She is not in acute distress. HENT:     Head: Normocephalic and atraumatic.  Eyes:     Extraocular Movements: Extraocular  movements intact.  Cardiovascular:     Rate and Rhythm: Normal rate and regular rhythm.     Pulses: Normal pulses.  Pulmonary:     Effort: Pulmonary effort is normal. No respiratory distress.     Breath sounds: Normal breath sounds. No stridor. No wheezing.     Comments: Resting comfortably, no signs of respiratory distress Speaking clearly in full sentences No facial or lip/tongue swelling Abdominal:     General: Bowel sounds are normal. There is no distension.     Palpations: Abdomen is soft.     Tenderness: There is no abdominal tenderness.  Musculoskeletal:     Cervical back: Normal range of motion.     Right lower leg: No edema.     Left lower leg: No edema.  Skin:    General: Skin is warm and dry.  Neurological:     General: No focal deficit present.     Mental Status: She is alert and oriented to person, place, and time.     Labs on Admission: I have personally reviewed following labs and imaging studies  CBC: Recent Labs  Lab 10/12/23 0111  WBC 8.3  NEUTROABS 4.5  HGB 12.7  HCT 40.5  MCV 81.0  PLT 260   Basic Metabolic Panel: Recent Labs  Lab 10/12/23 0111  NA 137  K 3.9  CL 103  CO2 22  GLUCOSE 128*  BUN 12  CREATININE 0.93  CALCIUM 9.0   GFR: Estimated Creatinine Clearance: 105.3 mL/min (by C-G  formula based on SCr of 0.93 mg/dL). Liver Function Tests: No results for input(s): "AST", "ALT", "ALKPHOS", "BILITOT", "PROT", "ALBUMIN" in the last 168 hours. No results for input(s): "LIPASE", "AMYLASE" in the last 168 hours. No results for input(s): "AMMONIA" in the last 168 hours. Coagulation Profile: No results for input(s): "INR", "PROTIME" in the last 168 hours. Cardiac Enzymes: No results for input(s): "CKTOTAL", "CKMB", "CKMBINDEX", "TROPONINI" in the last 168 hours. BNP (last 3 results) No results for input(s): "PROBNP" in the last 8760 hours. HbA1C: No results for input(s): "HGBA1C" in the last 72 hours. CBG: No results for input(s): "GLUCAP" in the last 168 hours. Lipid Profile: No results for input(s): "CHOL", "HDL", "LDLCALC", "TRIG", "CHOLHDL", "LDLDIRECT" in the last 72 hours. Thyroid Function Tests: No results for input(s): "TSH", "T4TOTAL", "FREET4", "T3FREE", "THYROIDAB" in the last 72 hours. Anemia Panel: No results for input(s): "VITAMINB12", "FOLATE", "FERRITIN", "TIBC", "IRON", "RETICCTPCT" in the last 72 hours. Urine analysis:    Component Value Date/Time   COLORURINE YELLOW 08/26/2023 1449   APPEARANCEUR HAZY (A) 08/26/2023 1449   LABSPEC 1.024 08/26/2023 1449   PHURINE 6.0 08/26/2023 1449   GLUCOSEU NEGATIVE 08/26/2023 1449   HGBUR SMALL (A) 08/26/2023 1449   BILIRUBINUR NEGATIVE 08/26/2023 1449   KETONESUR NEGATIVE 08/26/2023 1449   PROTEINUR NEGATIVE 08/26/2023 1449   NITRITE NEGATIVE 08/26/2023 1449   LEUKOCYTESUR MODERATE (A) 08/26/2023 1449    Radiological Exams on Admission: DG Chest Port 1 View  Result Date: 10/12/2023 CLINICAL DATA:  Allergic reaction. Facial swelling, difficulty breathing. EXAM: PORTABLE CHEST 1 VIEW COMPARISON:  09/14/2023 FINDINGS: Heart and mediastinal contours are within normal limits. No focal opacities or effusions. No acute bony abnormality. IMPRESSION: No active disease. Electronically Signed   By: Charlett Nose M.D.    On: 10/12/2023 01:31    EKG: Pending at this time.  Assessment and Plan  Anaphylaxis Unclear etiology.  Now significantly improved.  Vital signs stable, not hypoxic.  No wheezing or shortness of  breath.  Speaking clearly in full sentences.  No signs of respiratory distress.  Continue Solu-Medrol 40 mg every 12 hours, scheduled Benadryl and Pepcid.  Continuous pulse ox, supplemental oxygen as needed.  Follow-up with allergist as outpatient.  Asthma Continue Advair, albuterol as needed.  DVT prophylaxis: Lovenox Code Status: Full Code (discussed with the patient) Level of care: Progressive Care Unit Admission status: It is my clinical opinion that referral for OBSERVATION is reasonable and necessary in this patient based on the above information provided. The aforementioned taken together are felt to place the patient at high risk for further clinical deterioration. However, it is anticipated that the patient may be medically stable for discharge from the hospital within 24 to 48 hours.  John Giovanni MD [facility]  If 7PM-7AM, please contact night-coverage [email or web address]  10/12/2023, 3:02 AM

## 2023-10-12 NOTE — Telephone Encounter (Signed)
needs a referral to [facility]

## 2023-10-12 NOTE — Hospital Course (Signed)
HPI: Holly Nielsen is a 32 y.o. female with medical history significant of asthma, multiple allergies, pica, Guillain-Barr syndrome, iron deficiency anemia, morbid obesity.  She required ICU admission in August 2024 for anaphylaxis and required epinephrine drip.  Etiology of anaphylaxis was unclear.  Patient presents to the ED via EMS tonight for evaluation of acute onset throat swelling sensation, wheezing, and facial swelling.  She was given 10 mg albuterol, 0.5 mg Atrovent, 0.3 mg IM epinephrine, 50 mg Benadryl, 125 mg Solu-Medrol, and 2 g IV mag by EMS.  She reported improvement of her facial swelling on arrival to the ED but still complained of shortness of breath, throat tightness sensation, and itchy eyes.  Tachycardic and tachypneic on arrival.  Not hypoxic.  She was given additional IM epinephrine 0.3 mg and also given ipratropium neb and IV Pepcid 20 mg.  No significant abnormalities on labs including CBC and BMP.  Chest x-ray showing no active disease.  TRH called to admit.   Patient states as she was getting ready to go to bed tonight, all of a sudden her throat felt tight and she felt short of breath and started wheezing.  She went to the bathroom and saw that her eyes were puffy and also itching.  She was not sure if her symptoms were related to allergic reaction or asthma so she did not use her home EpiPen.  Her boyfriend called EMS.  She reports history of multiple environmental and food allergies.  She is seen by an allergist and her next appointment is in November.  Patient is not sure what triggered her symptoms tonight as she did not try eating anything new.  She reports significant improvement of her symptoms after receiving medications by EMS and in the ED.  No longer wheezing or feeling short of breath.  Denies chest pain.  No other complaints.    Significant Events: Admitted 10/12/2023 for recurrent anaphylaxis Given IM epi x by EMS and IM epi by EDP  Significant  Labs:   Significant Imaging Studies: CXR negative  Antibiotic Therapy: Anti-infectives (From admission, onward)    None       Procedures:   Consultants:

## 2023-10-12 NOTE — ED Triage Notes (Signed)
Pt BIB [facility] from home. Pt having allergic reaction from unknown source. On arrival pt had significant facial swelling, difficulty breathing, but denies swelling on tongue/throat. Pt had 2 ned treatments before EMS arrived.  EMS administered:  10mg  albuterol  .5mg  atrovent .3mg  epi IM 50mg  benadryl PO 125mg  solumedrol  2g mag IV

## 2023-10-12 NOTE — Subjective & Objective (Signed)
Pt seen and examined. Breathing better. Still with sore throat. No difficulty swallowing.  Has never been referred to allergy/immunology. Has never been dx with HAE

## 2023-10-12 NOTE — Assessment & Plan Note (Signed)
BMI 52.5

## 2023-10-12 NOTE — ED Notes (Signed)
ED TO INPATIENT HANDOFF REPORT  ED Nurse Name and Phone #:  Theophilus Bones [phone]  S Name/Age/Gender Holly Nielsen 32 y.o. female Room/Bed: 010C/010C  Code Status   Code Status: Full Code  Home/SNF/Other Home Patient oriented to: self, place, time, and situation Is this baseline? Yes   Triage Complete: Triage complete  Chief Complaint Anaphylaxis [T78.2XXA]  Triage Note Pt BIB [facility] from home. Pt having allergic reaction from unknown source. On arrival pt had significant facial swelling, difficulty breathing, but denies swelling on tongue/throat. Pt had 2 ned treatments before EMS arrived.  EMS administered:  10mg  albuterol  .5mg  atrovent .3mg  epi IM 50mg  benadryl PO 125mg  solumedrol  2g mag IV   Allergies Allergies  Allergen Reactions   Capsicum Anaphylaxis   Penicillins Anaphylaxis   Buspirone Diarrhea   Other Nausea And Vomiting and Diarrhea    Green bell peppers   Celery Oil Diarrhea   Iron Sucrose     Other reaction(s): Dizziness, Other (See Comments), Other - See Comments Pt became flushed, hot and diaphoretic after completion of infusion.  Pt became flushed, hot and diaphoretic after completion of infusion.     Nutricap [Actical] Diarrhea   Pedi-Pre Tape Spray [Wound Dressing Adhesive] Hives   Anise Extract [Flavoring Agent] Hives and Rash    Vanilla Extract    Level of Care/Admitting Diagnosis ED Disposition     ED Disposition  Admit   Condition  --   Comment  Hospital Area: [facility] [[id number]]  Level of Care: Progressive [102]  Admit to Progressive based on following criteria: RESPIRATORY PROBLEMS hypoxemic/hypercapnic respiratory failure that is responsive to NIPPV (BiPAP) or High Flow Nasal Cannula (6-80 lpm). Frequent assessment/intervention, no > Q2 hrs < Q4 hrs, to maintain oxygenation and pulmonary hygiene.  May place patient in observation at [facility] or [facility] if equivalent level of care is available:: Yes  Covid  Evaluation: Asymptomatic - no recent exposure (last 10 days) testing not required  Diagnosis: Anaphylaxis [190363]  Admitting Physician: John Giovanni [[id number]]  Attending Physician: John Giovanni [[id number]]          B Medical/Surgery History Past Medical History:  Diagnosis Date   Asthma    Guillain-Barre (HCC)    Iron deficiency anemia    Pica    Past Surgical History:  Procedure Laterality Date   APPENDECTOMY     CHOLECYSTECTOMY     thyroid cyst removal     TONSILLECTOMY     WISDOM TOOTH EXTRACTION       A IV Location/Drains/Wounds Patient Lines/Drains/Airways Status     Active Line/Drains/Airways     Name Placement date Placement time Site Days   Peripheral IV 10/12/23 20 G Anterior;Proximal;Left Forearm 10/12/23  --  Forearm  less than 1            Intake/Output Last 24 hours  Intake/Output Summary (Last 24 hours) at 10/12/2023 0851 Last data filed at 10/12/2023 0354 Gross per 24 hour  Intake 50 ml  Output --  Net 50 ml    Labs/Imaging Results for orders placed or performed during the hospital encounter of 10/12/23 (from the past 48 hour(s))  CBC with Differential     Status: Abnormal   Collection Time: 10/12/23  1:11 AM  Result Value Ref Range   WBC 8.3 4.0 - 10.5 K/uL   RBC 5.00 3.87 - 5.11 MIL/uL   Hemoglobin 12.7 12.0 - 15.0 g/dL   HCT 16.0 10.9 - 32.3 %   MCV  81.0 80.0 - 100.0 fL   MCH 25.4 (L) 26.0 - 34.0 pg   MCHC 31.4 30.0 - 36.0 g/dL   RDW 78.2 95.6 - 21.3 %   Platelets 260 150 - 400 K/uL   nRBC 0.0 0.0 - 0.2 %   Neutrophils Relative % 54 %   Neutro Abs 4.5 1.7 - 7.7 K/uL   Lymphocytes Relative 33 %   Lymphs Abs 2.7 0.7 - 4.0 K/uL   Monocytes Relative 7 %   Monocytes Absolute 0.6 0.1 - 1.0 K/uL   Eosinophils Relative 5 %   Eosinophils Absolute 0.4 0.0 - 0.5 K/uL   Basophils Relative 1 %   Basophils Absolute 0.1 0.0 - 0.1 K/uL   Immature Granulocytes 0 %   Abs Immature Granulocytes 0.02 0.00 - 0.07 K/uL    Comment:  Performed at [facility], [address], [place], [place] [address]  Basic metabolic panel     Status: Abnormal   Collection Time: 10/12/23  1:11 AM  Result Value Ref Range   Sodium 137 135 - 145 mmol/L   Potassium 3.9 3.5 - 5.1 mmol/L   Chloride 103 98 - 111 mmol/L   CO2 22 22 - 32 mmol/L   Glucose, Bld 128 (H) 70 - 99 mg/dL    Comment: Glucose reference range applies only to samples taken after fasting for at least 8 hours.   BUN 12 6 - 20 mg/dL   Creatinine, Ser 8.46 0.44 - 1.00 mg/dL   Calcium 9.0 8.9 - 96.2 mg/dL   GFR, Estimated >95 >28 mL/min    Comment: (NOTE) Calculated using the CKD-EPI Creatinine Equation (2021)    Anion gap 12 5 - 15    Comment: Performed at [facility], [address] [place], [place] [address]   DG Chest Port 1 View  Result Date: 10/12/2023 CLINICAL DATA:  Allergic reaction. Facial swelling, difficulty breathing. EXAM: PORTABLE CHEST 1 VIEW COMPARISON:  09/14/2023 FINDINGS: Heart and mediastinal contours are within normal limits. No focal opacities or effusions. No acute bony abnormality. IMPRESSION: No active disease. Electronically Signed   By: Charlett Nose M.D.   On: 10/12/2023 01:31    Pending Labs Unresulted Labs (From admission, onward)    None       Vitals/Pain Today's Vitals   10/12/23 0230 10/12/23 0250 10/12/23 0300 10/12/23 0519  BP: (!) 140/116  (!) 150/100   Pulse: 78  86 81  Resp: 18  17 18   Temp:    (!) 97.4 F (36.3 C)  TempSrc:      SpO2: 98%  95% 95%  Weight:      Height:      PainSc:  0-No pain      Isolation Precautions No active isolations  Medications Medications  albuterol (PROVENTIL) (2.5 MG/3ML) 0.083% nebulizer solution (10 mg/hr Nebulization Not Given 10/12/23 0848)  albuterol (PROVENTIL) (2.5 MG/3ML) 0.083% nebulizer solution 3 mL (has no administration in time range)  mometasone-formoterol (DULERA) 200-5 MCG/ACT inhaler 2 puff (has no administration in time range)  enoxaparin (LOVENOX)  injection 40 mg (has no administration in time range)  acetaminophen (TYLENOL) tablet 650 mg (has no administration in time range)    Or  acetaminophen (TYLENOL) suppository 650 mg (has no administration in time range)  diphenhydrAMINE (BENADRYL) capsule 25 mg (has no administration in time range)  predniSONE (DELTASONE) tablet 50 mg (50 mg Oral Given 10/12/23 0847)  famotidine (PEPCID) tablet 20 mg (has no administration in time  range)  EPINEPHrine (EPI-PEN) injection 0.3 mg (0.3 mg Intramuscular Given 10/12/23 0102)  ipratropium (ATROVENT) nebulizer solution 0.5 mg (0.5 mg Nebulization Given 10/12/23 0102)  famotidine (PEPCID) IVPB 20 mg premix (0 mg Intravenous Stopped 10/12/23 0354)    Mobility walks     Focused Assessments Pulmonary Assessment Handoff:  Lung sounds: Bilateral Breath Sounds: Diminished O2 Device: Room Air      R Recommendations: See Admitting Provider Note  Report given to:   Additional Notes:

## 2023-10-12 NOTE — Assessment & Plan Note (Signed)
Admitted for repeated bouts of anaphylaxis without a cause. Has been seeing pulmonary APP for allergic asthma. Has yet to have formal allergist consultation. She needs workup for HAE. Preferably at [facility] medical center where they have more expertise in the field. Pt able to tolerate liquids. Have changed to solid food. Change to po meds. Stop IV pepcid, steroids, etc.  Monitor overnight. Changed to med/surg bed.  10-13-2023. Pt was prepped for DC today. Then had more itching and redness of her chest. DC held. Continue with atarax 25 mg tid. Po pepcid and po prednisone.

## 2023-10-13 ENCOUNTER — Other Ambulatory Visit: Payer: Self-pay

## 2023-10-13 DIAGNOSIS — R000 Tachycardia, unspecified: Secondary | ICD-10-CM | POA: Diagnosis not present

## 2023-10-13 DIAGNOSIS — T782XXA Anaphylactic shock, unspecified, initial encounter: Secondary | ICD-10-CM | POA: Diagnosis not present

## 2023-10-13 DIAGNOSIS — L509 Urticaria, unspecified: Secondary | ICD-10-CM | POA: Diagnosis not present

## 2023-10-13 DIAGNOSIS — J4550 Severe persistent asthma, uncomplicated: Secondary | ICD-10-CM | POA: Diagnosis not present

## 2023-10-13 DIAGNOSIS — T7840XA Allergy, unspecified, initial encounter: Secondary | ICD-10-CM | POA: Diagnosis not present

## 2023-10-13 DIAGNOSIS — R062 Wheezing: Secondary | ICD-10-CM | POA: Diagnosis not present

## 2023-10-13 DIAGNOSIS — Z6841 Body Mass Index (BMI) 40.0 and over, adult: Secondary | ICD-10-CM | POA: Diagnosis not present

## 2023-10-13 MED ORDER — HYDROXYZINE HCL 25 MG PO TABS
25.0000 mg | ORAL_TABLET | Freq: Three times a day (TID) | ORAL | Status: DC
  Administered 2023-10-13 – 2023-10-14 (×3): 25 mg via ORAL
  Filled 2023-10-13 (×4): qty 1

## 2023-10-13 MED ORDER — DIPHENHYDRAMINE HCL 25 MG PO CAPS
50.0000 mg | ORAL_CAPSULE | Freq: Once | ORAL | Status: AC
  Administered 2023-10-13: 50 mg via ORAL
  Filled 2023-10-13: qty 2

## 2023-10-13 MED ORDER — NYSTATIN 100000 UNIT/GM EX POWD
Freq: Three times a day (TID) | CUTANEOUS | Status: DC | PRN
  Filled 2023-10-13: qty 15

## 2023-10-13 MED ORDER — DIPHENHYDRAMINE HCL 50 MG/ML IJ SOLN: 50.0000 mg | Freq: Once | INTRAMUSCULAR | Status: DC

## 2023-10-13 MED ORDER — PREDNISONE 10 MG PO TABS
ORAL_TABLET | ORAL | 0 refills | Status: AC
  Filled 2023-10-13: qty 14, 5d supply, fill #0

## 2023-10-13 MED ORDER — ALBUTEROL SULFATE (2.5 MG/3ML) 0.083% IN NEBU
3.0000 mL | INHALATION_SOLUTION | Freq: Four times a day (QID) | RESPIRATORY_TRACT | 0 refills | Status: DC | PRN
  Filled 2023-10-13 – 2023-10-28 (×2): qty 90, 8d supply, fill #0

## 2023-10-13 MED ORDER — FAMOTIDINE 20 MG PO TABS
20.0000 mg | ORAL_TABLET | Freq: Two times a day (BID) | ORAL | 0 refills | Status: DC
  Filled 2023-10-13: qty 60, 30d supply, fill #0

## 2023-10-13 MED ORDER — DIPHENHYDRAMINE HCL 25 MG PO CAPS: 25.0000 mg | ORAL_CAPSULE | Freq: Four times a day (QID) | ORAL | Status: DC | PRN

## 2023-10-13 NOTE — Plan of Care (Signed)
Problem: Health Behavior/Discharge Planning: Goal: Ability to manage health-related needs will improve Outcome: Progressing   Problem: Safety: Goal: Ability to remain free from injury will improve Outcome: Progressing

## 2023-10-13 NOTE — Plan of Care (Signed)
  Problem: Health Behavior/Discharge Planning: Goal: Ability to manage health-related needs will improve Outcome: Progressing Pt understands she was admitted into the hospital for throat swelling sensation, wheezing, and facial swelling.  She is on steroids to alleviate her symptoms.  Pt is currently awaiting discharge home to self care.   Problem: Clinical Measurements: Goal: Ability to maintain clinical measurements within normal limits will improve Outcome: Progressing VS WNL thus far.   Problem: Clinical Measurements: Goal: Will remain free from infection Outcome: Progressing S/Sx of infection monitored and assessed q-shift.  Pt has remained afebrile thus far.   Problem: Clinical Measurements: Goal: Respiratory complications will improve Outcome: Progressing Respiratory status monitored and assessed q-shift.  Pt is on room air with PO2 at 98-99% and respiration rate of 18 breaths per minutes.  Pt does not endorse c/o SOB or DOE thus far.  She is receiving breathing treatment and inhalers per MD's orders.   Problem: Safety: Goal: Ability to remain free from injury will improve Outcome: Progressing Pt has remained free from falls thus far.  Instructed pt to utilize RN call light for assistance.  Hourly rounds performed.  Bed in lowest position, locked, with two upper side rails engaged.  Belongings and call light within reach.

## 2023-10-13 NOTE — Progress Notes (Signed)
Pt was to be discharge home to self care.  At 1035 pt called out to RN station and stated " I am having increase SOB, increase redness to my face and chest!"  Rn went to assess.  Pt's PO2 was WNL and did not look in distress.  Secured Anders Simmonds, MD and made him aware of findings.  Imogene Burn, MD stated he would keep pt here another night for observation and cancel her discharge order home.

## 2023-10-13 NOTE — TOC Initial Note (Signed)
Transition of Care [facility]) - Initial/Assessment Note    Patient Details  Name: Holly Nielsen MRN: [id number] Date of Birth: December 27, 1991  Transition of [facility]) CM/SW Contact:    Janae Bridgeman, RN Phone Number: 10/13/2023, 11:18 AM  Clinical Narrative:                 CM met with the patient at the bedside to discuss TOC needs.  The patient lives near [facility] and requests call to [facility] office - appointment was scheduled for January - placed in AVS.    Patient will need a hospital follow up and patient was agreeable to [facility] - I called and left a message with the [facility] to call the patient back on her cell phone to schedule a hospital follow up in the next 1-2 weeks.  No other TOC needs at this time.  Expected Discharge Plan: Home/Self Care Barriers to Discharge: Continued Medical Work up   Patient Goals and CMS Choice Patient states their goals for this hospitalization and ongoing recovery are:: To get better CMS Medicare.gov Compare Post Acute Care list provided to:: Patient Choice offered to / list presented to : Patient [place] ownership interest in [facility].provided to:: Patient    Expected Discharge Plan and Services   Discharge Planning Services: CM Consult   Living arrangements for the past 2 months: Single Family Home Expected Discharge Date: 10/13/23                                    Prior Living Arrangements/Services Living arrangements for the past 2 months: Single Family Home Lives with:: Significant Other Patient language and need for interpreter reviewed:: Yes Do you feel safe going back to the place where you live?: Yes      Need for Family Participation in Patient Care: Yes (Comment) Care giver support system in place?: Yes (comment)   Criminal Activity/Legal Involvement Pertinent to Current Situation/Hospitalization: No - Comment as  needed  Activities of Daily Living   ADL Screening (condition at time of admission) Independently performs ADLs?: Yes (appropriate for developmental age) Is the patient deaf or have difficulty hearing?: No Does the patient have difficulty seeing, even when wearing glasses/contacts?: No Does the patient have difficulty concentrating, remembering, or making decisions?: No  Permission Sought/Granted Permission sought to share information with : Case Manager, Magazine features editor, PCP       Permission granted to share info w AGENCY: PCP - set up with [facility] for hospital follow up        Emotional Assessment Appearance:: Appears stated age Attitude/Demeanor/Rapport: Gracious Affect (typically observed): Accepting Orientation: : Oriented to Self, Oriented to Place, Oriented to  Time, Oriented to Situation Alcohol / Substance Use: Not Applicable Psych Involvement: No (comment)  Admission diagnosis:  Anaphylaxis [T78.2XXA] Allergic reaction, initial encounter [T78.40XA] Patient Active Problem List   Diagnosis Date Noted   Allergic reaction 07/31/2023   Anaphylaxis 07/30/2023   Severe persistent asthma 03/13/2022   Morbid obesity with BMI of 50.0-59.9, adult (HCC) 09/16/2021   Amenorrhea 09/16/2021   Anxiety and depression 09/16/2021   Cyst of thyroid 09/16/2021   History of iron deficiency anemia 09/16/2021   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   ADD (attention deficit disorder) 02/26/2018   H/O removal of thyroglossal duct cyst 12/27/2017   Asthma 10/15/2017   Guillain-Barre syndrome (  HCC) 09/28/2017   PCP:  Pcp, No Pharmacy:   [facility] [address] [place] [address] Phone: [phone] Fax: [phone]  [facility] [address], [address] [place] [address] Phone: [phone] Fax: [phone]  [place] [facility] [address] [place] [address] Phone: [phone] Fax: [phone]     Social Determinants of Health (SDOH) Social History: SDOH Screenings   Food Insecurity: No Food Insecurity (10/12/2023)  Housing: Low Risk  (10/12/2023)  Transportation Needs: No Transportation Needs (10/12/2023)  Utilities: Not At Risk (10/12/2023)  Tobacco Use: Low Risk  (10/12/2023)   SDOH Interventions:     Readmission Risk Interventions    08/03/2023   12:27 PM  Readmission Risk Prevention Plan  Post Dischage Appt Complete  Medication Screening Complete  Transportation Screening Complete

## 2023-10-13 NOTE — Progress Notes (Signed)
PROGRESS NOTE    Holly Nielsen  ZYS:[id number] DOB: [birth year] DOA: 10/12/2023 PCP: Pcp, No  Subjective: Pt seen and examined. Breathing better.  No issues this AM. Planned for DC this AM. Received call from RN that pt was having facial flushing again. DC held    Hospital Course: HPI: Holly Nielsen is a 32 y.o. female with medical history significant of asthma, multiple allergies, pica, Guillain-Barr syndrome, iron deficiency anemia, morbid obesity.  She required ICU admission in August 2024 for anaphylaxis and required epinephrine drip.  Etiology of anaphylaxis was unclear.  Patient presents to the ED via EMS tonight for evaluation of acute onset throat swelling sensation, wheezing, and facial swelling.  She was given 10 mg albuterol, 0.5 mg Atrovent, 0.3 mg IM epinephrine, 50 mg Benadryl, 125 mg Solu-Medrol, and 2 g IV mag by EMS.  She reported improvement of her facial swelling on arrival to the ED but still complained of shortness of breath, throat tightness sensation, and itchy eyes.  Tachycardic and tachypneic on arrival.  Not hypoxic.  She was given additional IM epinephrine 0.3 mg and also given ipratropium neb and IV Pepcid 20 mg.  No significant abnormalities on labs including CBC and BMP.  Chest x-ray showing no active disease.  TRH called to admit.   Patient states as she was getting ready to go to bed tonight, all of a sudden her throat felt tight and she felt short of breath and started wheezing.  She went to the bathroom and saw that her eyes were puffy and also itching.  She was not sure if her symptoms were related to allergic reaction or asthma so she did not use her home EpiPen.  Her boyfriend called EMS.  She reports history of multiple environmental and food allergies.  She is seen by an allergist and her next appointment is in November.  Patient is not sure what triggered her symptoms tonight as she did not try eating anything new.  She reports significant improvement of  her symptoms after receiving medications by EMS and in the ED.  No longer wheezing or feeling short of breath.  Denies chest pain.  No other complaints.    Significant Events: Admitted 10/12/2023 for recurrent anaphylaxis Given IM epi x by EMS and IM epi by EDP  Significant Labs:   Significant Imaging Studies: CXR negative  Antibiotic Therapy: Anti-infectives (From admission, onward)    None       Procedures:   Consultants:     Assessment and Plan: * Anaphylaxis Admitted for repeated bouts of anaphylaxis without a cause. Has been seeing pulmonary APP for allergic asthma. Has yet to have formal allergist consultation. She needs workup for HAE. Preferably at [facility] medical center where they have more expertise in the field. Pt able to tolerate liquids. Have changed to solid food. Change to po meds. Stop IV pepcid, steroids, etc.  Monitor overnight. Changed to med/surg bed.  10-13-2023. Pt was prepped for DC today. Then had more itching and redness of her chest. DC held. Continue with atarax 25 mg tid. Po pepcid and po prednisone.  Severe persistent asthma Sees pulmonology for Xolair injections. Stable. No wheezing.  Morbid obesity with BMI of 50.0-59.9, adult (HCC) BMI 52.5   DVT prophylaxis: enoxaparin (LOVENOX) injection 40 mg Start: 10/12/23 1400     Code Status: Full Code Family Communication: no family at bedside. Pt is decisional Disposition Plan: return home Reason for continuing need for hospitalization: continue monitoring of rash and for rebound  anaphylaxis.  Objective: Vitals:   10/13/23 0009 10/13/23 0510 10/13/23 0731 10/13/23 0831  BP: 137/82 (!) 130/91 120/60   Pulse: 88 73 72   Resp: 20 20 18    Temp: 98.7 F (37.1 C) 97.8 F (36.6 C) 97.7 F (36.5 C)   TempSrc: Oral Oral Oral   SpO2: 94% 98% 98% 99%  Weight:      Height:        Intake/Output Summary (Last 24 hours) at 10/13/2023 1259 Last data filed at 10/13/2023 0838 Gross per 24 hour   Intake 480 ml  Output --  Net 480 ml   Filed Weights   10/12/23 0044  Weight: 122 kg    Examination:  Physical Exam Vitals and nursing note reviewed.  Constitutional:      General: She is not in acute distress.    Appearance: She is obese. She is not toxic-appearing or diaphoretic.  HENT:     Head: Normocephalic and atraumatic.     Nose: Nose normal.  Eyes:     General: No scleral icterus. Cardiovascular:     Rate and Rhythm: Normal rate and regular rhythm.  Pulmonary:     Effort: Pulmonary effort is normal.     Breath sounds: Normal breath sounds.  Abdominal:     General: Abdomen is protuberant. Bowel sounds are normal. There is no distension.     Palpations: Abdomen is soft.  Musculoskeletal:     Right lower leg: No edema.     Left lower leg: No edema.  Skin:    General: Skin is warm and dry.     Capillary Refill: Capillary refill takes less than 2 seconds.     Findings: No rash.  Neurological:     General: No focal deficit present.     Mental Status: She is alert and oriented to person, place, and time.     Data Reviewed: I have personally reviewed following labs and imaging studies  CBC: Recent Labs  Lab 10/12/23 0111  WBC 8.3  NEUTROABS 4.5  HGB 12.7  HCT 40.5  MCV 81.0  PLT 260   Basic Metabolic Panel: Recent Labs  Lab 10/12/23 0111  NA 137  K 3.9  CL 103  CO2 22  GLUCOSE 128*  BUN 12  CREATININE 0.93  CALCIUM 9.0   GFR: Estimated Creatinine Clearance: 105.3 mL/min (by C-G formula based on SCr of 0.93 mg/dL).  Radiology Studies: DG Chest Port 1 View  Result Date: 10/12/2023 CLINICAL DATA:  Allergic reaction. Facial swelling, difficulty breathing. EXAM: PORTABLE CHEST 1 VIEW COMPARISON:  09/14/2023 FINDINGS: Heart and mediastinal contours are within normal limits. No focal opacities or effusions. No acute bony abnormality. IMPRESSION: No active disease. Electronically Signed   By: Charlett Nose M.D.   On: 10/12/2023 01:31    Scheduled  Meds:  enoxaparin (LOVENOX) injection  40 mg Subcutaneous Q24H   famotidine  20 mg Oral BID   hydrOXYzine  25 mg Oral TID   mometasone-formoterol  2 puff Inhalation BID   predniSONE  50 mg Oral Q breakfast   Continuous Infusions:   LOS: 0 days   Time spent: 35 minutes  Carollee Herter, DO  [facility]  10/13/2023, 12:59 PM

## 2023-10-14 DIAGNOSIS — Z6841 Body Mass Index (BMI) 40.0 and over, adult: Secondary | ICD-10-CM | POA: Diagnosis not present

## 2023-10-14 DIAGNOSIS — T782XXA Anaphylactic shock, unspecified, initial encounter: Secondary | ICD-10-CM | POA: Diagnosis not present

## 2023-10-14 DIAGNOSIS — J4550 Severe persistent asthma, uncomplicated: Secondary | ICD-10-CM | POA: Diagnosis not present

## 2023-10-14 NOTE — Discharge Summary (Signed)
Physician Discharge Summary  Holly Nielsen BMW:[id number] DOB: 02-13-1991 DOA: 10/12/2023  PCP: Pcp, No  Admit date: 10/12/2023 Discharge date: 10/14/2023 Admitted From: Home Disposition: Home Recommendations for Outpatient Follow-up:  Follow up with PCP in 1 week Patient has upcoming appointment with allergist at the [facility] for extensive testing Please follow up on the following pending results: None  Home Health: Not indicated Equipment/Devices: Not indicated  Discharge Condition: Stable CODE STATUS: Full code  Follow-up Information     [facility] Follow up on 01/05/2024.   Specialty: Radiology Why: You have an appointment scheduled for Tuesday, January 04, 2023 at 10:20 am. Contact information: [address] [place] [address] [phone]        [facility]. Call.   Specialty: Internal Medicine Why: Please call to schedule a hospital follow up in the next 7-10 days. Contact information: [address] [place] [address] [phone]                Hospital course 32 year old F with PMH of asthma, allergies/anaphylaxis, GBS, IDA, morbid obesity brought to ED by EMS with acute onset throat swelling sensation, wheezing and facial swelling.  She was received albuterol, Atrovent, epinephrine, Benadryl, magnesium and Solu-Medrol en route with improvement of his symptoms upon arrival to ED but she continued to complain shortness of breath, throat tightness sensation.  She was also tachycardic and tachypneic but not had oxygen.  She was given additional epinephrine, ipratropium nebs IV Pepcid.  CBC, BMP and chest x-ray without significant finding.  She has no eosinophilia.  She was admitted with working diagnosis of anaphylaxis.   Patient was continued on steroid, Pepcid, Benadryl inhalers/nebulizers with improvement in his symptoms.  On the day of discharge, symptoms resolved except for  some skin pruritus.  She has no skin rash.  She is discharged  epinephrine, prednisone, Pepcid and as needed Benadryl.  She has upcoming of with allergist at the [facility].       Problems addressed during this hospitalization Principal Problem:   Anaphylaxis Active Problems:   Morbid obesity with BMI of 50.0-59.9, adult (HCC)   Severe persistent asthma   Anaphylaxis?  Unknown etiology.  CBC, BMP and chest x-ray unrevealing.  She has no hemophilia.  Prior hospitalization in August 2024 for the same.  She states she has upcoming appointment with allergist at [facility].  Symptoms resolved.  Discharged on p.o. prednisone, Pepcid and Benadryl.  Patient has epinephrine at home.    Severe persistent asthma: Sees pulmonology for Xolair injections. -Continue home inhalers.   Morbid obesity with BMI of 50.0-59.9, adult (HCC) -Encouraged lifestyle change to lose weight.            Time spent 35 minutes  Vital signs Vitals:   10/13/23 2032 10/13/23 2100 10/14/23 0500 10/14/23 0816  BP: (!) 83/67 90/68 124/73 125/87  Pulse: 70  69 71  Temp: 97.8 F (36.6 C)  98.3 F (36.8 C) 97.6 F (36.4 C)  Resp: 18  17 16   Height:      Weight:      SpO2: 99%  98% 99%  TempSrc: Oral  Oral Oral  BMI (Calculated):         Discharge exam  GENERAL: No apparent distress.  Nontoxic. HEENT: MMM.  Vision and hearing grossly intact.  NECK: Supple.  No apparent JVD.  RESP:  No IWOB.  Fair aeration bilaterally. CVS:  RRR. Heart sounds normal.  ABD/GI/GU: BS+. Abd soft, NTND.  MSK/EXT:  Moves extremities. No apparent deformity. No edema.  SKIN: no apparent skin lesion or wound NEURO: Awake and alert. Oriented appropriately.  No apparent focal neuro deficit. PSYCH: Calm. Normal affect.   Discharge Instructions Discharge Instructions     Call MD for:  difficulty breathing, headache or visual disturbances   Complete by: As directed    Call MD for:  extreme fatigue   Complete by: As directed    Call MD  for:  hives   Complete by: As directed    Call MD for:  persistant dizziness or light-headedness   Complete by: As directed    Call MD for:  persistant nausea and vomiting   Complete by: As directed    Call MD for:  redness, tenderness, or signs of infection (pain, swelling, redness, odor or green/yellow discharge around incision site)   Complete by: As directed    Call MD for:  temperature >100.4   Complete by: As directed    Diet - low sodium heart healthy   Complete by: As directed    Discharge instructions   Complete by: As directed    1. New PCP appointment will be called to you. 2. Followup with pulmonology. 3. Referral has been made to [facility] Allergy   Increase activity slowly   Complete by: As directed       Allergies as of 10/14/2023       Reactions   Capsicum Anaphylaxis   Penicillins Anaphylaxis   Buspirone Diarrhea   Other Nausea And Vomiting, Diarrhea   Green bell peppers   Celery Oil Diarrhea   Iron Sucrose    Other reaction(s): Dizziness, Other (See Comments), Other - See Comments Pt became flushed, hot and diaphoretic after completion of infusion.  Pt became flushed, hot and diaphoretic after completion of infusion.    Nutricap [actical] Diarrhea   Pedi-pre Tape Spray [wound Dressing Adhesive] Hives   Anise Extract [flavoring Agent] Hives, Rash   Vanilla Extract        Medication List     TAKE these medications    acetaminophen 500 MG tablet Commonly known as: TYLENOL Take 2 tablets (1,000 mg total) by mouth every 8 (eight) hours.   albuterol 108 (90 Base) MCG/ACT inhaler Commonly known as: VENTOLIN HFA Inhale 2 puffs into the lungs every 6 hours as needed for wheezing.   albuterol (2.5 MG/3ML) 0.083% nebulizer solution Commonly known as: PROVENTIL Take 3 mLs by nebulization every 6 (six) hours as needed for wheezing or shortness of breath.   cetirizine 10 MG tablet Commonly known as: ZYRTEC Take 10 mg by mouth daily.   diphenhydrAMINE 25  mg capsule Commonly known as: BENADRYL Take 1 capsule (25 mg total) by mouth every 6 (six) hours as needed for itching.   EPINEPHrine 0.3 mg/0.3 mL Soaj injection Commonly known as: EPI-PEN Inject 0.3 mg into the muscle as needed for anaphylaxis.   famotidine 20 MG tablet Commonly known as: PEPCID Take 1 tablet (20 mg total) by mouth 2 (two) times daily.   fluticasone-salmeterol 500-50 MCG/ACT Aepb Commonly known as: ADVAIR Inhale 1 puff into the lungs in the morning and at bedtime.   naproxen 375 MG tablet Commonly known as: NAPROSYN Take 1 tablet (375 mg total) by mouth 2 (two) times daily.   oxyCODONE 5 MG immediate release tablet Commonly known as: Roxicodone Take 1 tablet (5 mg total) by mouth every 6 (six) hours as needed for breakthrough pain.  PARoxetine 12.5 MG 24 hr tablet Commonly known as: PAXIL-CR Take 12.5 mg by mouth every morning.   predniSONE 10 MG tablet Commonly known as: DELTASONE Take 4 tablets (40 mg total) by mouth daily with breakfast for 2 days, THEN 3 tablets (30 mg total) daily with breakfast for 1 day, THEN 2 tablets (20 mg total) daily with breakfast for 1 day, THEN 1 tablet (10 mg total) daily with breakfast for 1 day. Start taking on: October 14, 2023        Consultations: None  Procedures/Studies:   DG Chest Port 1 View  Result Date: 10/12/2023 CLINICAL DATA:  Allergic reaction. Facial swelling, difficulty breathing. EXAM: PORTABLE CHEST 1 VIEW COMPARISON:  09/14/2023 FINDINGS: Heart and mediastinal contours are within normal limits. No focal opacities or effusions. No acute bony abnormality. IMPRESSION: No active disease. Electronically Signed   By: Charlett Nose M.D.   On: 10/12/2023 01:31   DG Ankle Complete Left  Result Date: 10/06/2023 CLINICAL DATA:  Fall and trauma to the left lower extremity. EXAM: LEFT ANKLE COMPLETE - 3+ VIEW; LEFT KNEE - COMPLETE 4+ VIEW COMPARISON:  None Available. FINDINGS: There is no evidence of  fracture, dislocation, or joint effusion. There is no evidence of arthropathy or other focal bone abnormality. Soft tissues are unremarkable. IMPRESSION: Negative. Electronically Signed   By: Elgie Collard M.D.   On: 10/06/2023 02:46   DG Knee Complete 4 Views Left  Result Date: 10/06/2023 CLINICAL DATA:  Fall and trauma to the left lower extremity. EXAM: LEFT ANKLE COMPLETE - 3+ VIEW; LEFT KNEE - COMPLETE 4+ VIEW COMPARISON:  None Available. FINDINGS: There is no evidence of fracture, dislocation, or joint effusion. There is no evidence of arthropathy or other focal bone abnormality. Soft tissues are unremarkable. IMPRESSION: Negative. Electronically Signed   By: Elgie Collard M.D.   On: 10/06/2023 02:46       The results of significant diagnostics from this hospitalization (including imaging, microbiology, ancillary and laboratory) are listed below for reference.     Microbiology: No results found for this or any previous visit (from the past 240 hour(s)).   Labs:  CBC: Recent Labs  Lab 10/12/23 0111  WBC 8.3  NEUTROABS 4.5  HGB 12.7  HCT 40.5  MCV 81.0  PLT 260   BMP &GFR Recent Labs  Lab 10/12/23 0111  NA 137  K 3.9  CL 103  CO2 22  GLUCOSE 128*  BUN 12  CREATININE 0.93  CALCIUM 9.0   Estimated Creatinine Clearance: 105.3 mL/min (by C-G formula based on SCr of 0.93 mg/dL). Liver & Pancreas: No results for input(s): "AST", "ALT", "ALKPHOS", "BILITOT", "PROT", "ALBUMIN" in the last 168 hours. No results for input(s): "LIPASE", "AMYLASE" in the last 168 hours. No results for input(s): "AMMONIA" in the last 168 hours. Diabetic: No results for input(s): "HGBA1C" in the last 72 hours. No results for input(s): "GLUCAP" in the last 168 hours. Cardiac Enzymes: No results for input(s): "CKTOTAL", "CKMB", "CKMBINDEX", "TROPONINI" in the last 168 hours. No results for input(s): "PROBNP" in the last 8760 hours. Coagulation Profile: No results for input(s): "INR",  "PROTIME" in the last 168 hours. Thyroid Function Tests: No results for input(s): "TSH", "T4TOTAL", "FREET4", "T3FREE", "THYROIDAB" in the last 72 hours. Lipid Profile: No results for input(s): "CHOL", "HDL", "LDLCALC", "TRIG", "CHOLHDL", "LDLDIRECT" in the last 72 hours. Anemia Panel: No results for input(s): "VITAMINB12", "FOLATE", "FERRITIN", "TIBC", "IRON", "RETICCTPCT" in the last 72 hours. Urine analysis:    Component  Value Date/Time   COLORURINE YELLOW 08/26/2023 1449   APPEARANCEUR HAZY (A) 08/26/2023 1449   LABSPEC 1.024 08/26/2023 1449   PHURINE 6.0 08/26/2023 1449   GLUCOSEU NEGATIVE 08/26/2023 1449   HGBUR SMALL (A) 08/26/2023 1449   BILIRUBINUR NEGATIVE 08/26/2023 1449   KETONESUR NEGATIVE 08/26/2023 1449   PROTEINUR NEGATIVE 08/26/2023 1449   NITRITE NEGATIVE 08/26/2023 1449   LEUKOCYTESUR MODERATE (A) 08/26/2023 1449   Sepsis Labs: Invalid input(s): "PROCALCITONIN", "LACTICIDVEN"   SIGNED:  Almon Hercules, MD  [facility] 10/14/2023, 4:31 PM

## 2023-10-14 NOTE — Plan of Care (Signed)
Patient alert/oriented X4. Patient compliant with medication administration and PIV removed prior to discharge. AVS discharge instructions explained in detail. Patient belongings packed up at bedside. No complaints at this time.

## 2023-10-15 ENCOUNTER — Ambulatory Visit: Payer: Medicaid Other | Admitting: Orthopedic Surgery

## 2023-10-15 DIAGNOSIS — T7840XA Allergy, unspecified, initial encounter: Secondary | ICD-10-CM | POA: Diagnosis not present

## 2023-10-15 DIAGNOSIS — R490 Dysphonia: Secondary | ICD-10-CM | POA: Diagnosis not present

## 2023-10-15 DIAGNOSIS — R221 Localized swelling, mass and lump, neck: Secondary | ICD-10-CM | POA: Diagnosis not present

## 2023-10-15 DIAGNOSIS — J9811 Atelectasis: Secondary | ICD-10-CM | POA: Diagnosis not present

## 2023-10-16 ENCOUNTER — Other Ambulatory Visit: Payer: Self-pay

## 2023-10-16 DIAGNOSIS — J383 Other diseases of vocal cords: Secondary | ICD-10-CM | POA: Diagnosis not present

## 2023-10-16 DIAGNOSIS — J4550 Severe persistent asthma, uncomplicated: Secondary | ICD-10-CM | POA: Diagnosis not present

## 2023-10-16 MED ORDER — TRELEGY ELLIPTA 200-62.5-25 MCG/ACT IN AEPB
INHALATION_SPRAY | RESPIRATORY_TRACT | 3 refills | Status: DC
Start: 2023-10-16 — End: 2024-04-04
  Filled 2023-10-16 – 2023-10-26 (×2): qty 60, 30d supply, fill #0

## 2023-10-16 NOTE — Telephone Encounter (Signed)
Attempted to call pt but unable to reach. See that pt had a consultation with [facility] today 10/18 which was for an urgent consultation with pulmonology (allergy/asthma).  Left pt a detailed message stating to her that if she still needed to have a referral placed to call our office to let us know.

## 2023-10-22 ENCOUNTER — Other Ambulatory Visit: Payer: Self-pay

## 2023-10-26 ENCOUNTER — Other Ambulatory Visit: Payer: Self-pay

## 2023-10-26 ENCOUNTER — Ambulatory Visit: Payer: Medicaid Other | Admitting: Orthopedic Surgery

## 2023-10-26 DIAGNOSIS — S8392XA Sprain of unspecified site of left knee, initial encounter: Secondary | ICD-10-CM | POA: Diagnosis not present

## 2023-10-27 ENCOUNTER — Other Ambulatory Visit: Payer: Self-pay

## 2023-10-27 ENCOUNTER — Encounter: Payer: Self-pay | Admitting: Orthopedic Surgery

## 2023-10-27 ENCOUNTER — Other Ambulatory Visit (HOSPITAL_COMMUNITY): Payer: Self-pay

## 2023-10-27 NOTE — Progress Notes (Signed)
Office Visit Note   Patient: Holly Nielsen           Date of Birth: February 20, 1991           MRN: [id number] Visit Date: 10/26/2023              Requested by: Glendora Score, MD [address] [place],  [place] [address] PCP: Pcp, No  Chief Complaint  Patient presents with   Left Knee - Pain      HPI: Patient is a 32 year old woman who was seen for initial evaluation for left knee sprain.  Patient states she fell with a direct twisting impact on her knee.  She is currently ambulating with crutches and immobilizer.  Injury on October 8.  She complains of pain medial patella.  Patient states that she has had multiple sprains to her knees.  Patient states she is undergoing therapy as well as steroid injections.  Assessment & Plan: Visit Diagnoses:  1. Sprain of left knee, unspecified ligament, initial encounter     Plan: Recommended patient use the knee immobilizer for the sprain of the medial patellofemoral ligament.  Use crutches as needed.  Three-view radiographs of the left knee at follow-up.  This should include a sunrise view.  Follow-Up Instructions: Return in about 2 weeks (around 11/09/2023).   Ortho Exam  Patient is alert, oriented, no adenopathy, well-dressed, normal affect, normal respiratory effort. Examination Clausing cruciates are stable there is no knee effusion.  Medial lateral joint lines are nontender to palpation.  Patient is point tender to palpation over the medial patellofemoral ligament.  The patella is midline.  Radiographs show no evidence of a fracture.  Imaging: No results found. No images are attached to the encounter.  Labs: Lab Results  Component Value Date   HGBA1C 5.8 (H) 07/31/2023   REPTSTATUS 08/27/2023 FINAL 08/26/2023   CULT (A) 08/26/2023    50,000 COLONIES/mL GROUP B STREP(S.AGALACTIAE)ISOLATED TESTING AGAINST S. AGALACTIAE NOT ROUTINELY PERFORMED DUE TO PREDICTABILITY OF AMP/PEN/VAN SUSCEPTIBILITY. Performed at [facility]  Lab, [address] [place], [place] [address]      Lab Results  Component Value Date   ALBUMIN 3.9 08/26/2023   ALBUMIN 3.9 03/13/2022   ALBUMIN 4.0 07/14/2021    Lab Results  Component Value Date   MG 1.9 08/04/2023   MG 2.0 07/31/2023   No results found for: "VD25OH"  No results found for: "PREALBUMIN"    Latest Ref Rng & Units 10/12/2023    1:11 AM 09/14/2023    1:42 PM 08/26/2023    2:30 PM  CBC EXTENDED  WBC 4.0 - 10.5 K/uL 8.3  9.1  7.8   RBC 3.87 - 5.11 MIL/uL 5.00  4.79  4.72   Hemoglobin 12.0 - 15.0 g/dL 72.5  36.6  44.0   HCT 36.0 - 46.0 % 40.5  38.7  39.2   Platelets 150 - 400 K/uL 260  244  262   NEUT# 1.7 - 7.7 K/uL 4.5   4.9   Lymph# 0.7 - 4.0 K/uL 2.7   1.9      There is no height or weight on file to calculate BMI.  Orders:  No orders of the defined types were placed in this encounter.  No orders of the defined types were placed in this encounter.    Procedures: No procedures performed  Clinical Data: No additional findings.  ROS:  All other systems negative, except as noted in the HPI. Review of Systems  Objective: Vital  Signs: LMP 09/15/2023   Specialty Comments:  No specialty comments available.  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   ADD (attention deficit disorder) 02/26/2018   H/O removal of thyroglossal duct cyst 12/27/2017   Asthma 10/15/2017   Guillain-Barre syndrome (HCC) 09/28/2017   Past Medical History:  Diagnosis Date   Asthma    Guillain-Barre (HCC)    Iron deficiency anemia    Pica     Family History  Problem Relation Age of Onset   Asthma Father    Diabetes  Mellitus I Paternal Grandmother    Stroke Paternal Grandfather     Past Surgical History:  Procedure Laterality Date   APPENDECTOMY     CHOLECYSTECTOMY     thyroid cyst removal     TONSILLECTOMY     WISDOM TOOTH EXTRACTION     Social History   Occupational History   Not on file  Tobacco Use   Smoking status: Never   Smokeless tobacco: Never  Vaping Use   Vaping status: Never Used  Substance and Sexual Activity   Alcohol use: Never   Drug use: Never   Sexual activity: Yes

## 2023-10-28 ENCOUNTER — Other Ambulatory Visit: Payer: Self-pay

## 2023-10-28 ENCOUNTER — Other Ambulatory Visit (HOSPITAL_COMMUNITY): Payer: Self-pay

## 2023-11-02 ENCOUNTER — Telehealth: Payer: Self-pay

## 2023-11-02 ENCOUNTER — Telehealth: Payer: Self-pay | Admitting: *Deleted

## 2023-11-02 ENCOUNTER — Ambulatory Visit (INDEPENDENT_AMBULATORY_CARE_PROVIDER_SITE_OTHER): Payer: Medicaid Other | Admitting: Adult Health

## 2023-11-02 ENCOUNTER — Other Ambulatory Visit (HOSPITAL_COMMUNITY): Payer: Self-pay

## 2023-11-02 ENCOUNTER — Encounter: Payer: Self-pay | Admitting: Adult Health

## 2023-11-02 ENCOUNTER — Other Ambulatory Visit: Payer: Self-pay

## 2023-11-02 DIAGNOSIS — J4550 Severe persistent asthma, uncomplicated: Secondary | ICD-10-CM

## 2023-11-02 MED ORDER — ALBUTEROL SULFATE HFA 108 (90 BASE) MCG/ACT IN AERS
2.0000 | INHALATION_SPRAY | Freq: Four times a day (QID) | RESPIRATORY_TRACT | 5 refills | Status: DC | PRN
  Filled 2023-11-02 – 2023-11-13 (×3): qty 18, 25d supply, fill #0

## 2023-11-02 MED ORDER — TRELEGY ELLIPTA 200-62.5-25 MCG/ACT IN AEPB: 1.0000 | INHALATION_SPRAY | Freq: Every day | RESPIRATORY_TRACT | 0 refills | Status: DC

## 2023-11-02 MED ORDER — MONTELUKAST SODIUM 10 MG PO TABS
10.0000 mg | ORAL_TABLET | Freq: Every day | ORAL | 11 refills | Status: DC
  Filled 2023-11-02: qty 30, 30d supply, fill #0

## 2023-11-02 NOTE — Telephone Encounter (Signed)
Patient was prescribed Trelegy 200 and told to stop the Advair 500/50.  Her insurance is requiring a prior auth.  Can the pharmacy team please start a PA?  Thank you.

## 2023-11-02 NOTE — Telephone Encounter (Signed)
*  Pulm  Pharmacy Patient Advocate Encounter   Received notification from Pt Calls Messages that prior authorization for Trelegy Ellipta 200-62.5-25MCG/ACT aerosol powder  is required/requested.   Insurance verification completed.   The patient is insured through [employer] .   Per test claim: PA required; PA submitted to above mentioned insurance via CoverMyMeds Key/confirmation #/EOC [id number] Status is pending

## 2023-11-02 NOTE — Progress Notes (Unsigned)
@Patient  ID: Holly Nielsen, female    DOB: Sep 05, 1991, 32 y.o.   MRN: [id number]  Chief Complaint  Patient presents with   Follow-up   Discussed the use of AI scribe software for clinical note transcription with the patient, who gave verbal consent to proceed.   Referring provider: No ref. 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  She was last seen in the office December 2023.  She has severe persistent asthma.  Maintenance regimen was air duo twice daily, Singulair daily and Xolair.  Patient has had multiple hospitalizations over the last 3 months for anaphylaxis.  Seen at [facility] allergy and immunology.  Notes reviewed in care everywhere.  Patient has been recommended to see speech therapy and ENT for possible vocal cord dysfunction. She reports being off Xolair for approximately three to four months due to an lapse in insurance . Recently changed from Advair to Trelegy, currently awaiting prior authorization for Trelegy.  Not taking any maintenance inhalers. She has also been off Singulair.  She has been recommended to restart Xolair is currently awaiting insurance approval. The patient's asthma symptoms seem to wax and wane with intermittent cough.  Along with right fullness.  She is also had some intermittent facial puffiness especially around the eyes.    The patient has a history of Guillain-Barr syndrome approximately eight to nine years ago, believed to be triggered by multiple episodes of bronchitis. Does not get vaccines due to this. She also has anemia and pica, with a specific craving for ice.  The patient has three indoor dogs and one outdoor cat. The dogs sleep in the bedroom, and none  of the pets are hypoallergenic. The patient is currently unable to work due to her health condition and is employed at [employer].          Allergies  Allergen Reactions   Capsicum Anaphylaxis   Penicillins Anaphylaxis   Buspirone Diarrhea   Other Nausea And Vomiting and Diarrhea    Green bell peppers   Celery Oil Diarrhea   Iron Sucrose     Other reaction(s): Dizziness, Other (See Comments), Other - See Comments Pt became flushed, hot and diaphoretic after completion of infusion.  Pt became flushed, hot and diaphoretic after completion of infusion.     Nutricap [Actical] Diarrhea   Pedi-Pre Tape Spray [Wound Dressing Adhesive] Hives   Anise Extract [Flavoring Agent] Hives and Rash    Vanilla Extract    Immunization History  Administered Date(s) Administered   Influenza Split 09/25/2011    Past Medical History:  Diagnosis Date   Asthma    Guillain-Barre (HCC)    Iron deficiency anemia    Pica     Tobacco History: Social History   Tobacco Use  Smoking Status Never  Smokeless Tobacco Never   Counseling given: Not Answered   Outpatient Medications Prior to Visit  Medication Sig Dispense Refill   acetaminophen (TYLENOL) 500 MG tablet Take 2 tablets (1,000 mg total) by mouth every 8 (eight) hours. 180 tablet 0   albuterol (PROVENTIL) (2.5 MG/3ML) 0.083% nebulizer solution Take 3 mLs by nebulization every 6 (six) hours as needed for wheezing or shortness of breath. 90 mL 0   albuterol (VENTOLIN HFA) 108 (90  Base) MCG/ACT inhaler Inhale 2 puffs into the lungs every 6 hours as needed for wheezing. 18 g 5   cetirizine (ZYRTEC) 10 MG tablet Take 10 mg by mouth daily.     diphenhydrAMINE (BENADRYL) 25 mg capsule Take 1 capsule (25 mg total) by mouth every 6 (six) hours as needed for itching.     EPINEPHrine 0.3 mg/0.3 mL IJ SOAJ injection Inject 0.3 mg into the muscle as needed for anaphylaxis. 2 each 5   famotidine (PEPCID) 20 MG tablet Take 1 tablet (20 mg total) by  mouth 2 (two) times daily. 60 tablet 0   naproxen (NAPROSYN) 375 MG tablet Take 1 tablet (375 mg total) by mouth 2 (two) times daily. 20 tablet 0   PARoxetine (PAXIL-CR) 12.5 MG 24 hr tablet Take 12.5 mg by mouth every morning.     fluticasone-salmeterol (ADVAIR) 500-50 MCG/ACT AEPB Inhale 1 puff into the lungs in the morning and at bedtime. (Patient not taking: Reported on 11/02/2023)     Fluticasone-Umeclidin-Vilant (TRELEGY ELLIPTA) 200-62.5-25 MCG/ACT AEPB Inhale 1 Puff into the lungs once daily . Rinse mouth after use (Patient not taking: Reported on 11/02/2023) 60 each 3   oxyCODONE (ROXICODONE) 5 MG immediate release tablet Take 1 tablet (5 mg total) by mouth every 6 (six) hours as needed for breakthrough pain. (Patient not taking: Reported on 10/13/2023) 10 tablet 0   No facility-administered medications prior to visit.     Review of Systems:   Constitutional:   No  weight loss, night sweats,  Fevers, chills, +fatigue, or  lassitude.  HEENT:   No headaches,  Difficulty swallowing,  Tooth/dental problems, or  Sore throat,                No sneezing, itching, ear ache,+ nasal congestion, post nasal drip,   CV:  No chest pain,  Orthopnea, PND, swelling in lower extremities, anasarca, dizziness, palpitations, syncope.   GI  No heartburn, indigestion, abdominal pain, nausea, vomiting, diarrhea, change in bowel habits, loss of appetite, bloody stools.   Resp:  No chest wall deformity  Skin: no rash or lesions.  GU: no dysuria, change in color of urine, no urgency or frequency.  No flank pain, no hematuria   MS:  No joint pain or swelling.  No decreased range of motion.  No back pain.    Physical Exam   GEN: A/Ox3; pleasant , NAD, well nourished    HEENT:  [place]/AT,  EACs-clear, TMs-wnl, NOSE-clear, THROAT-clear, no lesions, no postnasal drip or exudate noted.   NECK:  Supple w/ fair ROM; no JVD; normal carotid impulses w/o bruits; no thyromegaly or nodules palpated; no  lymphadenopathy.    RESP  Clear  P & A; w/o, wheezes/ rales/ or rhonchi. no accessory muscle use, no dullness to percussion  CARD:  RRR, no m/r/g, no peripheral edema, pulses intact, no cyanosis or clubbing.  GI:   Soft & nt; nml bowel sounds; no organomegaly or masses detected.   Musco: Warm bil, no deformities or joint swelling noted.   Neuro: alert, no focal deficits noted.    Skin: Warm, no lesions or rashes    Lab Results:    BNP No results found for: "BNP"  ProBNP No results found for: "PROBNP"  Imaging:  Administration History     None           No data to display          No results found for: "NITRICOXIDE"  Assessment & Plan:   Assessment and Plan    Severe Persistent Asthma   She has severe persistent asthma, previously managed with Xolair and Advair. Off Xolair for 3-4 months due to insurance issues. She is awaiting prior authorization for Trelegy amid symptoms of wheezing and throat fullness, including a recent hospital admission two weeks ago. Pets may exacerbate her condition. We discussed the risks and benefits of restarting Singulair and starting Trelegy, including the need to rinse and gargle after using Trelegy to prevent oral thrush. We also talked about allergy-proofing the house to reduce asthma triggers. We will restart Singulair, begin Trelegy with samples until approval, perform PFT  and recommend allergy-proofing the house, including keeping pets out of the bedroom, using allergen filters, air purifier, and allergy pillow cases. She should rinse and gargle after using Trelegy and use Benadryl at the onset of symptoms. She will follow up with ENT next week,  and Doctor Everardo All in 4-6 weeks with PFT and call [facility] to check on Xolair approval.  Vocal Cord Dysfunction (VCD)   Possible VCD component /upper airway cough -potentially aggravated by reflux. We advised starting Pepcid for dual benefits with histamine blockers. She will begin  Pepcid twice a day. oral inhaler care discussed    Hx of Guillain-Barr Syndrome (GBS)   She had GBS approximately 8-9 years ago, secondary to multiple episodes of bronchitis. Decline flu and covid vaccine .   Sleep Apnea (High Risk)   She is at high risk for sleep apnea due to family history, anatomical airway considerations, and weight.  Previous testing was reported as negative.  Consider re-evaluate for sleep apnea after current issues are managed.    Follow-up   She will follow up with ENT next week, with Doctor Everardo All in 4-6 weeks with PFT , and call [facility] to check on Xolair approval.         Rubye Oaks, NP 11/02/2023

## 2023-11-02 NOTE — Patient Instructions (Signed)
Restart Singulair 10mg  At bedtime   Begin Pepcid 20mg  Twice daily .  Begin Trelegy 1 puff daily, rinse after use.  Albuterol inhaler or neb As needed   Begin Xolair when approved -per [facility].  Follow up with ENT next week.  Follow up with Dr. Everardo All in 4-6 weeks with PFT and As needed   Please contact office for sooner follow up if symptoms do not improve or worsen or seek emergency care

## 2023-11-02 NOTE — Telephone Encounter (Signed)
PA request has been Submitted. New Encounter created for follow up. For additional info see Pharmacy Prior Auth telephone encounter from 11/04.

## 2023-11-03 NOTE — Telephone Encounter (Signed)
Pharmacy Patient Advocate Encounter  Received notification from [facility] that Prior Authorization for Trelegy Ellipta 200-62.5-25mc/act has been DENIED.  See denial reason below. No denial letter attached in CMM. Will attach denial letter to Media tab once received.   PA #/Case ID/Reference #: Here are the policy requirements your request did not meet: Per your health plan's criteria, this drug is covered if you meet the following: One of the following: (1) You have failed two preferred drugs as confirmed by claims history or submission of medical records. The preferred drugs: brand Advair Diskus, Advair HFA, Dulera and brand Symbicort. (2) You cannot use two preferred drugs (please specify contraindication or intolerance).

## 2023-11-05 ENCOUNTER — Other Ambulatory Visit: Payer: Self-pay

## 2023-11-09 ENCOUNTER — Ambulatory Visit: Payer: Medicaid Other | Admitting: Orthopedic Surgery

## 2023-11-10 NOTE — Telephone Encounter (Signed)
Good evening, According to her medication record, she has tried and failed Advair and Dulera.  Can you update that and see if her insurance will cover the Trelegy?  Thank you.

## 2023-11-11 ENCOUNTER — Other Ambulatory Visit: Payer: Self-pay

## 2023-11-12 ENCOUNTER — Other Ambulatory Visit: Payer: Self-pay

## 2023-11-12 DIAGNOSIS — J342 Deviated nasal septum: Secondary | ICD-10-CM | POA: Diagnosis not present

## 2023-11-12 DIAGNOSIS — J385 Laryngeal spasm: Secondary | ICD-10-CM | POA: Diagnosis not present

## 2023-11-13 ENCOUNTER — Other Ambulatory Visit: Payer: Self-pay

## 2023-11-16 NOTE — Telephone Encounter (Signed)
PA resubmitted with updated information on trial and failures. Pending determination.   CMM Key: [id number]

## 2023-11-18 NOTE — Telephone Encounter (Signed)
Denied a second time even with qualifying information of failure to preferred alternatives.   Information has been sent to clinical pharmacist for appeals review. It may take 5-7 days to prepare the necessary documentation to request the appeal from the insurance.

## 2023-11-20 ENCOUNTER — Other Ambulatory Visit (HOSPITAL_COMMUNITY): Payer: Self-pay

## 2023-11-20 NOTE — Telephone Encounter (Signed)
Appeal has been submitted. Will advise when response is received or follow up in 1 week. Please be advised that most companies may take 30 days to make a decision.  Appeal letter and all pertinent information was faxed per the denial letter to [phone] @11 :38 am   Dellie Burns, PharmD Clinical Pharmacist [place]  Direct Dial: [phone]

## 2023-11-22 ENCOUNTER — Emergency Department (HOSPITAL_COMMUNITY): Payer: Medicaid Other

## 2023-11-22 ENCOUNTER — Encounter (HOSPITAL_COMMUNITY): Payer: Self-pay | Admitting: Family Medicine

## 2023-11-22 ENCOUNTER — Other Ambulatory Visit: Payer: Self-pay

## 2023-11-22 ENCOUNTER — Inpatient Hospital Stay (HOSPITAL_COMMUNITY)
Admission: EM | Admit: 2023-11-22 | Discharge: 2023-11-29 | DRG: 915 | Disposition: A | Discharge status: Home / Self Care | Payer: Medicaid Other | Attending: Internal Medicine | Admitting: Internal Medicine

## 2023-11-22 DIAGNOSIS — R1084 Generalized abdominal pain: Secondary | ICD-10-CM | POA: Diagnosis not present

## 2023-11-22 DIAGNOSIS — Y848 Other medical procedures as the cause of abnormal reaction of the patient, or of later complication, without mention of misadventure at the time of the procedure: Secondary | ICD-10-CM | POA: Diagnosis present

## 2023-11-22 DIAGNOSIS — R1111 Vomiting without nausea: Secondary | ICD-10-CM | POA: Diagnosis not present

## 2023-11-22 DIAGNOSIS — J45901 Unspecified asthma with (acute) exacerbation: Secondary | ICD-10-CM | POA: Diagnosis present

## 2023-11-22 DIAGNOSIS — Z833 Family history of diabetes mellitus: Secondary | ICD-10-CM

## 2023-11-22 DIAGNOSIS — T782XXA Anaphylactic shock, unspecified, initial encounter: Secondary | ICD-10-CM | POA: Diagnosis not present

## 2023-11-22 DIAGNOSIS — N200 Calculus of kidney: Principal | ICD-10-CM

## 2023-11-22 DIAGNOSIS — F419 Anxiety disorder, unspecified: Secondary | ICD-10-CM | POA: Diagnosis present

## 2023-11-22 DIAGNOSIS — E872 Acidosis, unspecified: Secondary | ICD-10-CM | POA: Diagnosis not present

## 2023-11-22 DIAGNOSIS — Z9102 Food additives allergy status: Secondary | ICD-10-CM

## 2023-11-22 DIAGNOSIS — R110 Nausea: Secondary | ICD-10-CM | POA: Diagnosis not present

## 2023-11-22 DIAGNOSIS — E66813 Obesity, class 3: Secondary | ICD-10-CM | POA: Diagnosis present

## 2023-11-22 DIAGNOSIS — N132 Hydronephrosis with renal and ureteral calculous obstruction: Secondary | ICD-10-CM | POA: Diagnosis present

## 2023-11-22 DIAGNOSIS — T886XXA Anaphylactic reaction due to adverse effect of correct drug or medicament properly administered, initial encounter: Principal | ICD-10-CM | POA: Diagnosis present

## 2023-11-22 DIAGNOSIS — T398X5A Adverse effect of other nonopioid analgesics and antipyretics, not elsewhere classified, initial encounter: Secondary | ICD-10-CM | POA: Diagnosis present

## 2023-11-22 DIAGNOSIS — Z91048 Other nonmedicinal substance allergy status: Secondary | ICD-10-CM

## 2023-11-22 DIAGNOSIS — E871 Hypo-osmolality and hyponatremia: Secondary | ICD-10-CM | POA: Diagnosis not present

## 2023-11-22 DIAGNOSIS — Z823 Family history of stroke: Secondary | ICD-10-CM

## 2023-11-22 DIAGNOSIS — Z91018 Allergy to other foods: Secondary | ICD-10-CM

## 2023-11-22 DIAGNOSIS — Z79899 Other long term (current) drug therapy: Secondary | ICD-10-CM

## 2023-11-22 DIAGNOSIS — Z6841 Body Mass Index (BMI) 40.0 and over, adult: Secondary | ICD-10-CM

## 2023-11-22 DIAGNOSIS — Z825 Family history of asthma and other chronic lower respiratory diseases: Secondary | ICD-10-CM

## 2023-11-22 DIAGNOSIS — Z888 Allergy status to other drugs, medicaments and biological substances status: Secondary | ICD-10-CM

## 2023-11-22 DIAGNOSIS — N179 Acute kidney failure, unspecified: Secondary | ICD-10-CM | POA: Diagnosis not present

## 2023-11-22 DIAGNOSIS — Z886 Allergy status to analgesic agent status: Secondary | ICD-10-CM

## 2023-11-22 DIAGNOSIS — D841 Defects in the complement system: Secondary | ICD-10-CM | POA: Diagnosis present

## 2023-11-22 DIAGNOSIS — T783XXA Angioneurotic edema, initial encounter: Secondary | ICD-10-CM | POA: Diagnosis present

## 2023-11-22 DIAGNOSIS — Z880 Allergy status to penicillin: Secondary | ICD-10-CM

## 2023-11-22 DIAGNOSIS — Z7951 Long term (current) use of inhaled steroids: Secondary | ICD-10-CM

## 2023-11-22 DIAGNOSIS — J9601 Acute respiratory failure with hypoxia: Secondary | ICD-10-CM | POA: Diagnosis present

## 2023-11-22 LAB — URINALYSIS, ROUTINE W REFLEX MICROSCOPIC
Bacteria, UA: NONE SEEN
Bilirubin Urine: NEGATIVE
Glucose, UA: NEGATIVE mg/dL
Ketones, ur: NEGATIVE mg/dL
Nitrite: NEGATIVE
Protein, ur: NEGATIVE mg/dL
Specific Gravity, Urine: 1.023 (ref 1.005–1.030)
pH: 5 (ref 5.0–8.0)

## 2023-11-22 LAB — COMPREHENSIVE METABOLIC PANEL
ALT: 27 U/L (ref 0–44)
AST: 25 U/L (ref 15–41)
Albumin: 4.2 g/dL (ref 3.5–5.0)
Alkaline Phosphatase: 63 U/L (ref 38–126)
Anion gap: 10 (ref 5–15)
BUN: 17 mg/dL (ref 6–20)
CO2: 22 mmol/L (ref 22–32)
Calcium: 9.2 mg/dL (ref 8.9–10.3)
Chloride: 104 mmol/L (ref 98–111)
Creatinine, Ser: 0.9 mg/dL (ref 0.44–1.00)
GFR, Estimated: >60 mL/min (ref >60)
Glucose, Bld: 135 mg/dL — ABNORMAL HIGH (ref 70–99)
Potassium: 4.3 mmol/L (ref 3.5–5.1)
Sodium: 136 mmol/L (ref 135–145)
Total Bilirubin: 0.3 mg/dL (ref <1.2)
Total Protein: 7.9 g/dL (ref 6.5–8.1)

## 2023-11-22 LAB — CBC WITH DIFFERENTIAL/PLATELET
Abs Immature Granulocytes: 0.04 10*3/uL (ref 0.00–0.07)
Basophils Absolute: 0.1 10*3/uL (ref 0.0–0.1)
Basophils Relative: 1 %
Eosinophils Absolute: 0.2 10*3/uL (ref 0.0–0.5)
Eosinophils Relative: 2 %
HCT: 40.5 % (ref 36.0–46.0)
Hemoglobin: 13.2 g/dL (ref 12.0–15.0)
Immature Granulocytes: 0 %
Lymphocytes Relative: 15 %
Lymphs Abs: 1.6 10*3/uL (ref 0.7–4.0)
MCH: 26.3 pg (ref 26.0–34.0)
MCHC: 32.6 g/dL (ref 30.0–36.0)
MCV: 80.7 fL (ref 80.0–100.0)
Monocytes Absolute: 0.6 10*3/uL (ref 0.1–1.0)
Monocytes Relative: 6 %
Neutro Abs: 8.1 10*3/uL — ABNORMAL HIGH (ref 1.7–7.7)
Neutrophils Relative %: 76 %
Platelets: 225 10*3/uL (ref 150–400)
RBC: 5.02 MIL/uL (ref 3.87–5.11)
RDW: 15.1 % (ref 11.5–15.5)
WBC: 10.6 10*3/uL — ABNORMAL HIGH (ref 4.0–10.5)
nRBC: 0 % (ref 0.0–0.2)

## 2023-11-22 LAB — HCG, SERUM, QUALITATIVE: Preg, Serum: NEGATIVE

## 2023-11-22 LAB — CREATININE, SERUM
Creatinine, Ser: 0.92 mg/dL (ref 0.44–1.00)
GFR, Estimated: >60 mL/min (ref >60)

## 2023-11-22 LAB — MAGNESIUM: Magnesium: 2.2 mg/dL (ref 1.7–2.4)

## 2023-11-22 LAB — MRSA NEXT GEN BY PCR, NASAL: MRSA by PCR Next Gen: NOT DETECTED

## 2023-11-22 MED ORDER — ONDANSETRON HCL 4 MG PO TABS: 4.0000 mg | ORAL_TABLET | Freq: Four times a day (QID) | ORAL | Status: DC | PRN

## 2023-11-22 MED ORDER — ACETAMINOPHEN 325 MG PO TABS
650.0000 mg | ORAL_TABLET | Freq: Four times a day (QID) | ORAL | Status: DC | PRN
  Administered 2023-11-23: 650 mg via ORAL
  Filled 2023-11-22: qty 2

## 2023-11-22 MED ORDER — SODIUM CHLORIDE 0.9 % IV BOLUS
250.0000 mL | Freq: Once | INTRAVENOUS | Status: AC
  Administered 2023-11-22: 250 mL via INTRAVENOUS

## 2023-11-22 MED ORDER — LORATADINE 10 MG PO TABS
10.0000 mg | ORAL_TABLET | Freq: Every day | ORAL | Status: DC
  Administered 2023-11-22 – 2023-11-29 (×8): 10 mg via ORAL
  Filled 2023-11-22 (×8): qty 1

## 2023-11-22 MED ORDER — DIPHENHYDRAMINE HCL 50 MG/ML IJ SOLN: 50.0000 mg | Freq: Once | INTRAMUSCULAR | Status: AC

## 2023-11-22 MED ORDER — MAGNESIUM SULFATE 2 GM/50ML IV SOLN: 2.0000 g | INTRAVENOUS | Status: DC

## 2023-11-22 MED ORDER — TRAMADOL HCL 50 MG PO TABS
25.0000 mg | ORAL_TABLET | Freq: Once | ORAL | Status: AC
  Administered 2023-11-22: 25 mg via ORAL
  Filled 2023-11-22: qty 1

## 2023-11-22 MED ORDER — EPINEPHRINE 0.3 MG/0.3ML IJ SOAJ
0.3000 mg | INTRAMUSCULAR | Status: DC | PRN
  Administered 2023-11-24 – 2023-11-25 (×4): 0.3 mg via INTRAMUSCULAR
  Filled 2023-11-22 (×11): qty 0.3

## 2023-11-22 MED ORDER — METHYLPREDNISOLONE SODIUM SUCC 125 MG IJ SOLR: 125.0000 mg | Freq: Once | INTRAMUSCULAR | Status: AC

## 2023-11-22 MED ORDER — EPINEPHRINE 0.3 MG/0.3ML IJ SOAJ
INTRAMUSCULAR | Status: AC
  Administered 2023-11-22: 0.3 mg via INTRAMUSCULAR
  Filled 2023-11-22: qty 0.3

## 2023-11-22 MED ORDER — SODIUM CHLORIDE 0.9% FLUSH
3.0000 mL | Freq: Two times a day (BID) | INTRAVENOUS | Status: DC
  Administered 2023-11-22 – 2023-11-29 (×14): 3 mL via INTRAVENOUS

## 2023-11-22 MED ORDER — ENOXAPARIN SODIUM 40 MG/0.4ML IJ SOSY
40.0000 mg | PREFILLED_SYRINGE | Freq: Every day | INTRAMUSCULAR | Status: DC
  Filled 2023-11-22 (×3): qty 0.4

## 2023-11-22 MED ORDER — IPRATROPIUM-ALBUTEROL 0.5-2.5 (3) MG/3ML IN SOLN
3.0000 mL | Freq: Once | RESPIRATORY_TRACT | Status: AC
  Administered 2023-11-24: 3 mL via RESPIRATORY_TRACT
  Filled 2023-11-22: qty 3

## 2023-11-22 MED ORDER — MONTELUKAST SODIUM 10 MG PO TABS
10.0000 mg | ORAL_TABLET | Freq: Every day | ORAL | Status: DC
  Administered 2023-11-22 – 2023-11-28 (×7): 10 mg via ORAL
  Filled 2023-11-22 (×7): qty 1

## 2023-11-22 MED ORDER — DIPHENHYDRAMINE HCL 25 MG PO CAPS
25.0000 mg | ORAL_CAPSULE | Freq: Four times a day (QID) | ORAL | Status: DC | PRN
  Administered 2023-11-22 – 2023-11-25 (×9): 25 mg via ORAL
  Filled 2023-11-22 (×9): qty 1

## 2023-11-22 MED ORDER — HYDROMORPHONE HCL 1 MG/ML IJ SOLN
1.0000 mg | Freq: Once | INTRAMUSCULAR | Status: AC
  Administered 2023-11-22: 1 mg via INTRAVENOUS
  Filled 2023-11-22: qty 1

## 2023-11-22 MED ORDER — KETOROLAC TROMETHAMINE 15 MG/ML IJ SOLN
15.0000 mg | Freq: Once | INTRAMUSCULAR | Status: AC
  Administered 2023-11-22: 15 mg via INTRAVENOUS
  Filled 2023-11-22: qty 1

## 2023-11-22 MED ORDER — IPRATROPIUM-ALBUTEROL 0.5-2.5 (3) MG/3ML IN SOLN
3.0000 mL | Freq: Once | RESPIRATORY_TRACT | Status: AC
  Administered 2023-11-22: 3 mL via RESPIRATORY_TRACT
  Filled 2023-11-22: qty 3

## 2023-11-22 MED ORDER — ONDANSETRON HCL 4 MG/2ML IJ SOLN
4.0000 mg | Freq: Once | INTRAMUSCULAR | Status: AC
  Administered 2023-11-22: 4 mg via INTRAVENOUS
  Filled 2023-11-22: qty 2

## 2023-11-22 MED ORDER — EPINEPHRINE 0.3 MG/0.3ML IJ SOAJ: 0.3000 mg | Freq: Once | INTRAMUSCULAR | Status: AC

## 2023-11-22 MED ORDER — MAGNESIUM SULFATE 2 GM/50ML IV SOLN
INTRAVENOUS | Status: AC
  Administered 2023-11-22: 2 g via INTRAVENOUS
  Filled 2023-11-22: qty 50

## 2023-11-22 MED ORDER — METHYLPREDNISOLONE SODIUM SUCC 125 MG IJ SOLR
INTRAMUSCULAR | Status: AC
  Administered 2023-11-22: 125 mg via INTRAVENOUS
  Filled 2023-11-22: qty 2

## 2023-11-22 MED ORDER — MAGNESIUM SULFATE 50 % IJ SOLN: 2.0000 g | Freq: Once | INTRAMUSCULAR | Status: DC

## 2023-11-22 MED ORDER — ACETAMINOPHEN 650 MG RE SUPP: 650.0000 mg | Freq: Four times a day (QID) | RECTAL | Status: DC | PRN

## 2023-11-22 MED ORDER — CHLORHEXIDINE GLUCONATE CLOTH 2 % EX PADS
6.0000 | MEDICATED_PAD | Freq: Every day | CUTANEOUS | Status: DC
  Administered 2023-11-22: 6 via TOPICAL

## 2023-11-22 MED ORDER — IPRATROPIUM-ALBUTEROL 0.5-2.5 (3) MG/3ML IN SOLN
RESPIRATORY_TRACT | Status: AC
  Administered 2023-11-22: 3 mL
  Filled 2023-11-22: qty 6

## 2023-11-22 MED ORDER — FLUTICASONE FUROATE-VILANTEROL 200-25 MCG/ACT IN AEPB
1.0000 | INHALATION_SPRAY | Freq: Every day | RESPIRATORY_TRACT | Status: DC
  Administered 2023-11-23 – 2023-11-25 (×3): 1 via RESPIRATORY_TRACT
  Filled 2023-11-22: qty 28

## 2023-11-22 MED ORDER — FAMOTIDINE 20 MG PO TABS
20.0000 mg | ORAL_TABLET | Freq: Two times a day (BID) | ORAL | Status: DC
  Filled 2023-11-22 (×2): qty 1

## 2023-11-22 MED ORDER — ALBUTEROL SULFATE (2.5 MG/3ML) 0.083% IN NEBU
10.0000 mg | INHALATION_SOLUTION | Freq: Once | RESPIRATORY_TRACT | Status: AC
  Administered 2023-11-22: 10 mg via RESPIRATORY_TRACT

## 2023-11-22 MED ORDER — ALBUTEROL SULFATE (2.5 MG/3ML) 0.083% IN NEBU
INHALATION_SOLUTION | RESPIRATORY_TRACT | Status: AC
  Filled 2023-11-22: qty 12

## 2023-11-22 MED ORDER — DIPHENHYDRAMINE HCL 50 MG/ML IJ SOLN
INTRAMUSCULAR | Status: AC
  Administered 2023-11-22: 50 mg via INTRAVENOUS
  Filled 2023-11-22: qty 1

## 2023-11-22 MED ORDER — HYDRALAZINE HCL 20 MG/ML IJ SOLN
10.0000 mg | Freq: Four times a day (QID) | INTRAMUSCULAR | Status: DC | PRN
  Administered 2023-11-22 – 2023-11-24 (×3): 10 mg via INTRAVENOUS
  Filled 2023-11-22 (×3): qty 1

## 2023-11-22 MED ORDER — ONDANSETRON HCL 4 MG/2ML IJ SOLN
4.0000 mg | Freq: Four times a day (QID) | INTRAMUSCULAR | Status: DC | PRN
  Administered 2023-11-22: 4 mg via INTRAVENOUS
  Filled 2023-11-22: qty 2

## 2023-11-22 MED ORDER — UMECLIDINIUM BROMIDE 62.5 MCG/ACT IN AEPB
1.0000 | INHALATION_SPRAY | Freq: Every day | RESPIRATORY_TRACT | Status: DC
  Administered 2023-11-23 – 2023-11-25 (×3): 1 via RESPIRATORY_TRACT
  Filled 2023-11-22: qty 7

## 2023-11-22 MED ORDER — SODIUM CHLORIDE 0.9 % IV SOLN
Freq: Once | INTRAVENOUS | Status: AC
Start: 2023-11-22 — End: 2023-11-22

## 2023-11-22 MED ORDER — ORAL CARE MOUTH RINSE: 15.0000 mL | OROMUCOSAL | Status: DC | PRN

## 2023-11-22 MED ORDER — ALBUTEROL SULFATE (2.5 MG/3ML) 0.083% IN NEBU
3.0000 mL | INHALATION_SOLUTION | Freq: Four times a day (QID) | RESPIRATORY_TRACT | Status: DC | PRN
  Administered 2023-11-24 – 2023-11-25 (×4): 3 mL via RESPIRATORY_TRACT
  Filled 2023-11-22 (×4): qty 3

## 2023-11-22 MED ORDER — IPRATROPIUM-ALBUTEROL 0.5-2.5 (3) MG/3ML IN SOLN
3.0000 mL | Freq: Once | RESPIRATORY_TRACT | Status: AC
  Administered 2023-11-22: 3 mL via RESPIRATORY_TRACT

## 2023-11-22 MED ORDER — METHYLPREDNISOLONE SODIUM SUCC 40 MG IJ SOLR
40.0000 mg | Freq: Two times a day (BID) | INTRAMUSCULAR | Status: DC
  Administered 2023-11-22 – 2023-11-25 (×6): 40 mg via INTRAVENOUS
  Filled 2023-11-22 (×6): qty 1

## 2023-11-22 MED ORDER — HYDROMORPHONE HCL 1 MG/ML IJ SOLN
0.5000 mg | Freq: Once | INTRAMUSCULAR | Status: AC | PRN
  Administered 2023-11-22: 0.5 mg via INTRAVENOUS
  Filled 2023-11-22: qty 1

## 2023-11-22 MED ORDER — EPINEPHRINE HCL 5 MG/250ML IV SOLN IN NS
INTRAVENOUS | Status: AC
  Filled 2023-11-22: qty 250

## 2023-11-22 NOTE — ED Triage Notes (Signed)
Pt. Arrived by EMS. A&O. Pt. C/o lower abdominal pain in the right quadrant. Pt. States pain is a sharp stabbing pain non-radiating. Pt. States she took zofran at home. N/V x 2. No appendix or gallbladder.  BP:144/64 P:100 R: 14 O2: 98% CBG:145

## 2023-11-22 NOTE — Progress Notes (Signed)
Arrived from Emergency Department to ICU/Stepdown room 1233. Vitals obtained and admitted to cardiac monitoring.     11/22/23 1522  Vitals  Temp 98.7 F (37.1 C)  Temp Source Oral  BP (!) 172/78  MAP (mmHg) 106  BP Location Left Arm  BP Method Automatic  Patient Position (if appropriate) Lying  Pulse Rate (!) 112  Pulse Rate Source Monitor  ECG Heart Rate (!) 114  Resp 18  MEWS COLOR  MEWS Score Color Yellow  Oxygen Therapy  SpO2 97 %  O2 Device Nasal Cannula  O2 Flow Rate (L/min) 2 L/min  Pain Assessment  Pain Scale 0-10  Pain Score 4  Pain Type Acute pain  Pain Location Groin  Pain Orientation Mid  Pain Descriptors / Indicators Aching;Discomfort  Pain Frequency Intermittent  Pain Onset On-going  Patients Stated Pain Goal 0  Pain Intervention(s) RN made aware  MEWS Score  MEWS Temp 0  MEWS Systolic 0  MEWS Pulse 2  MEWS RR 0  MEWS LOC 0  MEWS Score 2

## 2023-11-22 NOTE — H&P (Addendum)
History and Physical    Holly Nielsen RUE:[id number] DOB: October 25, 1991 DOA: 11/22/2023  PCP: Pcp, No  Patient coming from: Home  I have personally briefly reviewed patient's old medical records in [facility]  Chief Complaint: Abdominal pain  HPI: Holly Nielsen is a 32 y.o. female with medical history significant of recurrent anaphylaxis, asthma, obesity, iron deficiency anemia initially presented to ED with complaint of right lower quadrant pain which started suddenly at 5 AM.  The pain was crampy, intermittent, nonradiating, no aggravating or relieving factor and was 10 out of 10.  No associated fever, chills, sweating, nausea, vomiting, any problem with urination or with bowel movement.  No recent travel or sick contact.  No other complaint.  ED Course: Upon arrival to ED, she was hemodynamically stable with slightly elevated diastolic blood pressure.  CT abdomen and pelvis showed small 4 mm right ureteric stone causing right mild hydroureteronephrosis.  She received Toradol.  An hour later, out of concern of ovarian torsion, while she was undergoing pelvic ultrasound, patient had sudden anaphylactic, intriguing agent is not known.  Patient was noted to have wheezing and periorbital edema.  She then received several medications including Benadryl, albuterol nebulizer which is continuing right now, epinephrine, DuoNeb, Solu-Medrol, Zofran as well as IV fluids.  Hospital service was consulted for further admission and management.  Review of Systems: As per HPI otherwise negative.    Past Medical History:  Diagnosis Date   Asthma    Guillain-Barre (HCC)    Iron deficiency anemia    Pica     Past Surgical History:  Procedure Laterality Date   APPENDECTOMY     CHOLECYSTECTOMY     thyroid cyst removal     TONSILLECTOMY     WISDOM TOOTH EXTRACTION       reports that she has never smoked. She has never used smokeless tobacco. She reports that she does not drink alcohol and does  not use drugs.  Allergies  Allergen Reactions   Capsicum Anaphylaxis   Penicillins Anaphylaxis   Buspirone Diarrhea   Other Nausea And Vomiting and Diarrhea    Green bell peppers   Celery Oil Diarrhea   Iron Sucrose Other (See Comments)     Dizziness, Pt became flushed, hot and diaphoretic after completion of infusion.      Nutricap [Actical] Diarrhea   Pedi-Pre Tape Spray [Wound Dressing Adhesive] Hives   Wound Dressings Hives   Anise Extract [Flavoring Agent] Hives and Rash    Vanilla Extract    Family History  Problem Relation Age of Onset   Asthma Father    Diabetes Mellitus I Paternal Grandmother    Stroke Paternal Grandfather     Prior to Admission medications   Medication Sig Start Date End Date Taking? Authorizing Provider  omalizumab Geoffry Paradise) 300 MG/2ML injection Inject into the skin. 10/16/23  Yes [provider]  albuterol (PROVENTIL) (2.5 MG/3ML) 0.083% nebulizer solution Take 3 mLs by nebulization every 6 (six) hours as needed for wheezing or shortness of breath. 10/13/23 01/11/24  Carollee Herter, DO  albuterol (VENTOLIN HFA) 108 (90 Base) MCG/ACT inhaler Inhale 2 puffs into the lungs every 6 hours as needed for wheezing. 11/02/23   Parrett, Virgel Bouquet, NP  cetirizine (ZYRTEC) 10 MG tablet Take 10 mg by mouth daily.    [provider]  diphenhydrAMINE (BENADRYL) 25 mg capsule Take 1 capsule (25 mg total) by mouth every 6 (six) hours as needed for itching. 10/13/23   Carollee Herter, DO  EPINEPHrine 0.3 mg/0.3 mL IJ SOAJ injection Inject 0.3 mg into the muscle as needed for anaphylaxis. 08/14/23   Luciano Cutter, MD  famotidine (PEPCID) 20 MG tablet Take 1 tablet (20 mg total) by mouth 2 (two) times daily. 10/13/23 11/12/23  Carollee Herter, DO  fluticasone-salmeterol (ADVAIR) 500-50 MCG/ACT AEPB Inhale 1 puff into the lungs in the morning and at bedtime. Patient not taking: Reported on 11/02/2023    [provider]  Fluticasone-Umeclidin-Vilant (TRELEGY  ELLIPTA) 200-62.5-25 MCG/ACT AEPB Inhale 1 Puff into the lungs once daily . Rinse mouth after use Patient not taking: Reported on 11/02/2023 10/16/23     Fluticasone-Umeclidin-Vilant (TRELEGY ELLIPTA) 200-62.5-25 MCG/ACT AEPB Inhale 1 puff into the lungs daily at 6 (six) AM. 11/02/23   Parrett, Tammy S, NP  montelukast (SINGULAIR) 10 MG tablet Take 1 tablet (10 mg total) by mouth at bedtime. 11/02/23   Parrett, Virgel Bouquet, NP  naproxen (NAPROSYN) 375 MG tablet Take 1 tablet (375 mg total) by mouth 2 (two) times daily. 10/06/23   Kommor, Madison, MD  PARoxetine (PAXIL-CR) 12.5 MG 24 hr tablet Take 12.5 mg by mouth every morning. 07/08/21   [provider]    Physical Exam: Vitals:   11/22/23 1214 11/22/23 1217 11/22/23 1225 11/22/23 1240  BP:      Pulse:      Resp:      Temp:    98.3 F (36.8 C)  TempSrc:    Oral  SpO2: 97% 100% 94%     Constitutional: NAD, calm, comfortable Vitals:   11/22/23 1214 11/22/23 1217 11/22/23 1225 11/22/23 1240  BP:      Pulse:      Resp:      Temp:    98.3 F (36.8 C)  TempSrc:    Oral  SpO2: 97% 100% 94%    Eyes: PERRL, lids and conjunctivae normal, periorbital edema, getting albuterol nebulizer ENMT: Mucous membranes are moist. Posterior pharynx clear of any exudate or lesions.Normal dentition.  Neck: normal, supple, no masses, no thyromegaly Respiratory: Bilateral expiratory wheezes.  No crackles or rhonchi. Cardiovascular: Regular rate and rhythm, no murmurs / rubs / gallops. No extremity edema. 2+ pedal pulses. No carotid bruits.  Abdomen: no tenderness, no masses palpated. No hepatosplenomegaly. Bowel sounds positive.  Musculoskeletal: no clubbing / cyanosis. No joint deformity upper and lower extremities. Good ROM, no contractures. Normal muscle tone.  Skin: no rashes, lesions, ulcers. No induration Neurologic: CN 2-12 grossly intact. Sensation intact, DTR normal. Strength 5/5 in all 4.  Psychiatric: Normal judgment and insight. Alert and  oriented x 3. Normal mood.    Labs on Admission: I have personally reviewed following labs and imaging studies  CBC: Recent Labs  Lab 11/22/23 0752  WBC 10.6*  NEUTROABS 8.1*  HGB 13.2  HCT 40.5  MCV 80.7  PLT 225   Basic Metabolic Panel: Recent Labs  Lab 11/22/23 0752  NA 136  K 4.3  CL 104  CO2 22  GLUCOSE 135*  BUN 17  CREATININE 0.90  CALCIUM 9.2   GFR: CrCl cannot be calculated (Unknown ideal weight.). Liver Function Tests: Recent Labs  Lab 11/22/23 0752  AST 25  ALT 27  ALKPHOS 63  BILITOT 0.3  PROT 7.9  ALBUMIN 4.2   No results for input(s): "LIPASE", "AMYLASE" in the last 168 hours. No results for input(s): "AMMONIA" in the last 168 hours. Coagulation Profile: No results for input(s): "INR", "PROTIME" in the last 168 hours. Cardiac Enzymes: No results for  input(s): "CKTOTAL", "CKMB", "CKMBINDEX", "TROPONINI" in the last 168 hours. BNP (last 3 results) No results for input(s): "PROBNP" in the last 8760 hours. HbA1C: No results for input(s): "HGBA1C" in the last 72 hours. CBG: No results for input(s): "GLUCAP" in the last 168 hours. Lipid Profile: No results for input(s): "CHOL", "HDL", "LDLCALC", "TRIG", "CHOLHDL", "LDLDIRECT" in the last 72 hours. Thyroid Function Tests: No results for input(s): "TSH", "T4TOTAL", "FREET4", "T3FREE", "THYROIDAB" in the last 72 hours. Anemia Panel: No results for input(s): "VITAMINB12", "FOLATE", "FERRITIN", "TIBC", "IRON", "RETICCTPCT" in the last 72 hours. Urine analysis:    Component Value Date/Time   COLORURINE YELLOW 11/22/2023 0926   APPEARANCEUR HAZY (A) 11/22/2023 0926   LABSPEC 1.023 11/22/2023 0926   PHURINE 5.0 11/22/2023 0926   GLUCOSEU NEGATIVE 11/22/2023 0926   HGBUR LARGE (A) 11/22/2023 0926   BILIRUBINUR NEGATIVE 11/22/2023 0926   KETONESUR NEGATIVE 11/22/2023 0926   PROTEINUR NEGATIVE 11/22/2023 0926   NITRITE NEGATIVE 11/22/2023 0926   LEUKOCYTESUR MODERATE (A) 11/22/2023 0926     Radiological Exams on Admission: US Pelvis Complete  Result Date: 11/22/2023 CLINICAL DATA:  sudden rlq pain EXAM: TRANSABDOMINAL AND TRANSVAGINAL ULTRASOUND OF PELVIS DOPPLER ULTRASOUND OF OVARIES TECHNIQUE: Both transabdominal and transvaginal ultrasound examinations of the pelvis were performed. Transabdominal technique was performed for global imaging of the pelvis including uterus, ovaries, adnexal regions, and pelvic cul-de-sac. It was necessary to proceed with endovaginal exam following the transabdominal exam to visualize the bilateral ovaries and adnexa. Color and duplex Doppler ultrasound was utilized to evaluate blood flow to the ovaries. COMPARISON:  CT scan abdomen and pelvis performed earlier the same day. FINDINGS: The technologist noted limited exam due to morbid obesity and overlying bowel gas. Uterus Measurements: 4.1 x 4.7 x 8.4 cm = volume: 84.4 mL. No fibroids or other mass visualized. Endometrium Thickness: 9.0 mm.  No focal abnormality visualized. Right ovary Measurements: 2.2 x 2.7 x 4.4 cm. = volume: 13.5 mL. Normal appearance/no adnexal mass. Left ovary Measurements: 1.1 x 1.5 x 2.6 cm. = volume: 3.1 mL. Normal appearance/no adnexal mass. Pulsed Doppler evaluation of both ovaries demonstrates normal low-resistance arterial and venous waveforms. While demonstrable color flow and spectral Doppler in an ovary cannot completely exclude ovarian torsion, in combination with a normal grayscale appearance of the ovary, this makes torsion highly unlikely. Other findings No abnormal free fluid. IMPRESSION: *Unremarkable pelvic ultrasound and Doppler exam. Electronically Signed   By: Jules Schick M.D.   On: 11/22/2023 12:38   US Transvaginal Non-OB  Result Date: 11/22/2023 CLINICAL DATA:  sudden rlq pain EXAM: TRANSABDOMINAL AND TRANSVAGINAL ULTRASOUND OF PELVIS DOPPLER ULTRASOUND OF OVARIES TECHNIQUE: Both transabdominal and transvaginal ultrasound examinations of the pelvis were  performed. Transabdominal technique was performed for global imaging of the pelvis including uterus, ovaries, adnexal regions, and pelvic cul-de-sac. It was necessary to proceed with endovaginal exam following the transabdominal exam to visualize the bilateral ovaries and adnexa. Color and duplex Doppler ultrasound was utilized to evaluate blood flow to the ovaries. COMPARISON:  CT scan abdomen and pelvis performed earlier the same day. FINDINGS: The technologist noted limited exam due to morbid obesity and overlying bowel gas. Uterus Measurements: 4.1 x 4.7 x 8.4 cm = volume: 84.4 mL. No fibroids or other mass visualized. Endometrium Thickness: 9.0 mm.  No focal abnormality visualized. Right ovary Measurements: 2.2 x 2.7 x 4.4 cm. = volume: 13.5 mL. Normal appearance/no adnexal mass. Left ovary Measurements: 1.1 x 1.5 x 2.6 cm. = volume: 3.1 mL. Normal  appearance/no adnexal mass. Pulsed Doppler evaluation of both ovaries demonstrates normal low-resistance arterial and venous waveforms. While demonstrable color flow and spectral Doppler in an ovary cannot completely exclude ovarian torsion, in combination with a normal grayscale appearance of the ovary, this makes torsion highly unlikely. Other findings No abnormal free fluid. IMPRESSION: *Unremarkable pelvic ultrasound and Doppler exam. Electronically Signed   By: Jules Schick M.D.   On: 11/22/2023 12:38   [place] Art/Ven Flow Abd Pelv Doppler  Result Date: 11/22/2023 CLINICAL DATA:  sudden rlq pain EXAM: TRANSABDOMINAL AND TRANSVAGINAL ULTRASOUND OF PELVIS DOPPLER ULTRASOUND OF OVARIES TECHNIQUE: Both transabdominal and transvaginal ultrasound examinations of the pelvis were performed. Transabdominal technique was performed for global imaging of the pelvis including uterus, ovaries, adnexal regions, and pelvic cul-de-sac. It was necessary to proceed with endovaginal exam following the transabdominal exam to visualize the bilateral ovaries and adnexa. Color and  duplex Doppler ultrasound was utilized to evaluate blood flow to the ovaries. COMPARISON:  CT scan abdomen and pelvis performed earlier the same day. FINDINGS: The technologist noted limited exam due to morbid obesity and overlying bowel gas. Uterus Measurements: 4.1 x 4.7 x 8.4 cm = volume: 84.4 mL. No fibroids or other mass visualized. Endometrium Thickness: 9.0 mm.  No focal abnormality visualized. Right ovary Measurements: 2.2 x 2.7 x 4.4 cm. = volume: 13.5 mL. Normal appearance/no adnexal mass. Left ovary Measurements: 1.1 x 1.5 x 2.6 cm. = volume: 3.1 mL. Normal appearance/no adnexal mass. Pulsed Doppler evaluation of both ovaries demonstrates normal low-resistance arterial and venous waveforms. While demonstrable color flow and spectral Doppler in an ovary cannot completely exclude ovarian torsion, in combination with a normal grayscale appearance of the ovary, this makes torsion highly unlikely. Other findings No abnormal free fluid. IMPRESSION: *Unremarkable pelvic ultrasound and Doppler exam. Electronically Signed   By: Jules Schick M.D.   On: 11/22/2023 12:38   CT Renal Stone Study  Result Date: 11/22/2023 CLINICAL DATA:  Abdominal/flank pain, stone suspected EXAM: CT ABDOMEN AND PELVIS WITHOUT CONTRAST TECHNIQUE: Multidetector CT imaging of the abdomen and pelvis was performed following the standard protocol without IV contrast. RADIATION DOSE REDUCTION: This exam was performed according to the departmental dose-optimization program which includes automated exposure control, adjustment of the mA and/or kV according to patient size and/or use of iterative reconstruction technique. COMPARISON:  None Available. FINDINGS: Evaluation is limited by lack of IV contrast. Lower chest: No acute abnormality. Hepatobiliary: Status post cholecystectomy. Unremarkable noncontrast appearance of the liver. Pancreas: No peripancreatic fat stranding. Spleen: Unremarkable. Adrenals/Urinary Tract: Adrenal glands are  unremarkable. There is a 4 mm ureterolithiasis in the proximal RIGHT ureter with mild upstream hydroureteronephrosis. There are several additional punctate nonobstructing bilateral nephrolithiasis. No LEFT-sided hydronephrosis. Bladder is decompressed and unremarkable. Stomach/Bowel: No evidence of bowel obstruction. Status post appendectomy. Stomach is unremarkable. Vascular/Lymphatic: No significant vascular findings are present. No enlarged abdominal or pelvic lymph nodes. Reproductive: Uterus and bilateral adnexa are unremarkable. Other: No free air or free fluid. Musculoskeletal: Bilateral assimilation joints at the lumbosacral junction. No acute osseous abnormality. IMPRESSION: 1. There is a 4 mm ureterolithiasis in the proximal RIGHT ureter with mild upstream hydroureteronephrosis. 2. There are several additional punctate nonobstructing bilateral nephrolithiasis. Electronically Signed   By: Meda Klinefelter M.D.   On: 11/22/2023 09:10    Assessment/Plan Principal Problem:   Anaphylaxis   Acute hypoxic respiratory failure secondary to acute asthma exacerbation/anaphylactic reaction: Patient has had at least 4 hospitalization and last about 2 to 3 months for  similar complaints of anaphylaxis.  The agent has never been known.  She has also followed with [facility] allergist and has been told that she has some sort of vocal cord issues and she has also followed up with an ENT locally as well as pulmonologist as well.  Patient and I agree, vocal cord issue does not explain facial/periorbital edema.  She is already working on seeking second opinion with different allergist.  Currently she appears to have acute asthma exacerbation.  I will continue her on Solu-Medrol 40 mg IV twice daily.  Will resume all her medications from home which will include albuterol, epinephrine as needed, famotidine, loratadine.  We will monitor her in the stepdown unit.  Right ureteric stone with mild hydroureteronephrosis: This is a  small stone and likely will pass.  She is currently pain-free.  UA unremarkable.  Morbid obesity: Diet modification and weight loss is counseled.  DVT prophylaxis: enoxaparin (LOVENOX) injection 40 mg Start: 11/22/23 1400 Code Status: Full code Family Communication: None present at bedside.  Plan of care discussed with patient in length and he verbalized understanding and agreed with it. Disposition Plan: May discharge tomorrow Consults called: None   Hughie Closs MD [facility]  *Please note that this is a verbal dictation therefore any spelling or grammatical errors are due to the "[employer] One" system interpretation.  Please page via Amion and do not message via secure chat for urgent patient care matters. Secure chat can be used for non urgent patient care matters. 11/22/2023, 1:13 PM  To contact the attending provider between 7A-7P or the covering provider during after hours 7P-7A, please log into the web site [email or web address]

## 2023-11-22 NOTE — ED Notes (Signed)
Pt is aware of giving another urine sample and requested for water.

## 2023-11-22 NOTE — ED Notes (Signed)
ED TO INPATIENT HANDOFF REPORT  ED Nurse Name and Phone #: Arnetha Gula Name/Age/Gender Holly Nielsen 32 y.o. female Room/Bed: WA21/WA21  Code Status   Code Status: Full Code  Home/SNF/Other Home Patient oriented to: self, place, time, and situation Is this baseline? Yes   Triage Complete: Triage complete  Chief Complaint Anaphylaxis [T78.2XXA]  Triage Note Pt. Arrived by EMS. A&O. Pt. C/o lower abdominal pain in the right quadrant. Pt. States pain is a sharp stabbing pain non-radiating. Pt. States she took zofran at home. N/V x 2. No appendix or gallbladder.  BP:144/64 P:100 R: 14 O2: 98% CBG:145   Allergies Allergies  Allergen Reactions   Capsicum Anaphylaxis   Penicillins Anaphylaxis   Buspirone Diarrhea   Other Nausea And Vomiting and Diarrhea    Green bell peppers   Celery Oil Diarrhea   Iron Sucrose Other (See Comments)     Dizziness, Pt became flushed, hot and diaphoretic after completion of infusion.      Nutricap [Actical] Diarrhea   Pedi-Pre Tape Spray [Wound Dressing Adhesive] Hives   Wound Dressings Hives   Anise Extract [Flavoring Agent] Hives and Rash    Vanilla Extract    Level of Care/Admitting Diagnosis ED Disposition     ED Disposition  Admit   Condition  --   Comment  Hospital Area: [facility] [[id number]]  Level of Care: Stepdown [14]  Admit to SDU based on following criteria: Respiratory Distress:  Frequent assessment and/or intervention to maintain adequate ventilation/respiration, pulmonary toilet, and respiratory treatment.  May place patient in observation at [facility] or [facility] if equivalent level of care is available:: No  Covid Evaluation: Asymptomatic - no recent exposure (last 10 days) testing not required  Diagnosis: Anaphylaxis [190363]  Admitting Physician: Hughie Closs [[id number]]  Attending Physician: Hughie Closs [[id number]]          B Medical/Surgery History Past Medical  History:  Diagnosis Date   Asthma    Guillain-Barre (HCC)    Iron deficiency anemia    Pica    Past Surgical History:  Procedure Laterality Date   APPENDECTOMY     CHOLECYSTECTOMY     thyroid cyst removal     TONSILLECTOMY     WISDOM TOOTH EXTRACTION       A IV Location/Drains/Wounds Patient Lines/Drains/Airways Status     Active Line/Drains/Airways     Name Placement date Placement time Site Days   Peripheral IV 11/22/23 20 G Anterior;Distal;Right;Upper Arm 11/22/23  0802  Arm  less than 1   Peripheral IV 11/22/23 20 G Left Antecubital 11/22/23  1221  Antecubital  less than 1            Intake/Output Last 24 hours No intake or output data in the 24 hours ending 11/22/23 1410  Labs/Imaging Results for orders placed or performed during the hospital encounter of 11/22/23 (from the past 48 hour(s))  Comprehensive metabolic panel     Status: Abnormal   Collection Time: 11/22/23  7:52 AM  Result Value Ref Range   Sodium 136 135 - 145 mmol/L   Potassium 4.3 3.5 - 5.1 mmol/L   Chloride 104 98 - 111 mmol/L   CO2 22 22 - 32 mmol/L   Glucose, Bld 135 (H) 70 - 99 mg/dL    Comment: Glucose reference range applies only to samples taken after fasting for at least 8 hours.   BUN 17 6 - 20 mg/dL   Creatinine, Ser 5.62 0.44 -  1.00 mg/dL   Calcium 9.2 8.9 - 01.0 mg/dL   Total Protein 7.9 6.5 - 8.1 g/dL   Albumin 4.2 3.5 - 5.0 g/dL   AST 25 15 - 41 U/L   ALT 27 0 - 44 U/L   Alkaline Phosphatase 63 38 - 126 U/L   Total Bilirubin 0.3 <1.2 mg/dL   GFR, Estimated >27 >25 mL/min    Comment: (NOTE) Calculated using the CKD-EPI Creatinine Equation (2021)    Anion gap 10 5 - 15    Comment: Performed at [facility], [address] [place], [place] [address]  CBC with Differential     Status: Abnormal   Collection Time: 11/22/23  7:52 AM  Result Value Ref Range   WBC 10.6 (H) 4.0 - 10.5 K/uL   RBC 5.02 3.87 - 5.11 MIL/uL   Hemoglobin 13.2 12.0 - 15.0 g/dL    HCT 03.4 74.2 - 59.5 %   MCV 80.7 80.0 - 100.0 fL   MCH 26.3 26.0 - 34.0 pg   MCHC 32.6 30.0 - 36.0 g/dL   RDW 63.8 75.6 - 43.3 %   Platelets 225 150 - 400 K/uL   nRBC 0.0 0.0 - 0.2 %   Neutrophils Relative % 76 %   Neutro Abs 8.1 (H) 1.7 - 7.7 K/uL   Lymphocytes Relative 15 %   Lymphs Abs 1.6 0.7 - 4.0 K/uL   Monocytes Relative 6 %   Monocytes Absolute 0.6 0.1 - 1.0 K/uL   Eosinophils Relative 2 %   Eosinophils Absolute 0.2 0.0 - 0.5 K/uL   Basophils Relative 1 %   Basophils Absolute 0.1 0.0 - 0.1 K/uL   Immature Granulocytes 0 %   Abs Immature Granulocytes 0.04 0.00 - 0.07 K/uL    Comment: Performed at [facility], [address], [place], [place] [address]  hCG, serum, qualitative     Status: None   Collection Time: 11/22/23  7:52 AM  Result Value Ref Range   Preg, Serum NEGATIVE NEGATIVE    Comment:        THE SENSITIVITY OF THIS METHODOLOGY IS >10 mIU/mL. Performed at [facility], [address] [place], [place] [address]   Creatinine, serum     Status: None   Collection Time: 11/22/23  7:52 AM  Result Value Ref Range   Creatinine, Ser 0.92 0.44 - 1.00 mg/dL   GFR, Estimated >06 >30 mL/min    Comment: (NOTE) Calculated using the CKD-EPI Creatinine Equation (2021) Performed at [facility], [address] [place], [place] [address]   Magnesium     Status: None   Collection Time: 11/22/23  7:52 AM  Result Value Ref Range   Magnesium 2.2 1.7 - 2.4 mg/dL    Comment: Performed at [facility], [address], [place], [place] [address]  Urinalysis, Routine w reflex microscopic -Urine, Clean Catch     Status: Abnormal   Collection Time: 11/22/23  9:26 AM  Result Value Ref Range   Color, Urine YELLOW YELLOW   APPearance HAZY (A) CLEAR   Specific Gravity, Urine 1.023 1.005 - 1.030   pH 5.0 5.0 - 8.0   Glucose, UA NEGATIVE NEGATIVE mg/dL   Hgb urine dipstick LARGE (A) NEGATIVE   Bilirubin  Urine NEGATIVE NEGATIVE   Ketones, ur NEGATIVE NEGATIVE mg/dL   Protein, ur NEGATIVE NEGATIVE mg/dL   Nitrite NEGATIVE NEGATIVE   Leukocytes,Ua MODERATE (A) NEGATIVE   RBC / HPF 21-50  0 - 5 RBC/hpf   WBC, UA 11-20 0 - 5 WBC/hpf   Bacteria, UA NONE SEEN NONE SEEN   Squamous Epithelial / HPF 11-20 0 - 5 /HPF   Mucus PRESENT    Hyaline Casts, UA PRESENT     Comment: Performed at [facility], [address] [place], [place] [address]   US Pelvis Complete  Result Date: 11/22/2023 CLINICAL DATA:  sudden rlq pain EXAM: TRANSABDOMINAL AND TRANSVAGINAL ULTRASOUND OF PELVIS DOPPLER ULTRASOUND OF OVARIES TECHNIQUE: Both transabdominal and transvaginal ultrasound examinations of the pelvis were performed. Transabdominal technique was performed for global imaging of the pelvis including uterus, ovaries, adnexal regions, and pelvic cul-de-sac. It was necessary to proceed with endovaginal exam following the transabdominal exam to visualize the bilateral ovaries and adnexa. Color and duplex Doppler ultrasound was utilized to evaluate blood flow to the ovaries. COMPARISON:  CT scan abdomen and pelvis performed earlier the same day. FINDINGS: The technologist noted limited exam due to morbid obesity and overlying bowel gas. Uterus Measurements: 4.1 x 4.7 x 8.4 cm = volume: 84.4 mL. No fibroids or other mass visualized. Endometrium Thickness: 9.0 mm.  No focal abnormality visualized. Right ovary Measurements: 2.2 x 2.7 x 4.4 cm. = volume: 13.5 mL. Normal appearance/no adnexal mass. Left ovary Measurements: 1.1 x 1.5 x 2.6 cm. = volume: 3.1 mL. Normal appearance/no adnexal mass. Pulsed Doppler evaluation of both ovaries demonstrates normal low-resistance arterial and venous waveforms. While demonstrable color flow and spectral Doppler in an ovary cannot completely exclude ovarian torsion, in combination with a normal grayscale appearance of the ovary, this makes torsion highly unlikely. Other  findings No abnormal free fluid. IMPRESSION: *Unremarkable pelvic ultrasound and Doppler exam. Electronically Signed   By: Jules Schick M.D.   On: 11/22/2023 12:38   US Transvaginal Non-OB  Result Date: 11/22/2023 CLINICAL DATA:  sudden rlq pain EXAM: TRANSABDOMINAL AND TRANSVAGINAL ULTRASOUND OF PELVIS DOPPLER ULTRASOUND OF OVARIES TECHNIQUE: Both transabdominal and transvaginal ultrasound examinations of the pelvis were performed. Transabdominal technique was performed for global imaging of the pelvis including uterus, ovaries, adnexal regions, and pelvic cul-de-sac. It was necessary to proceed with endovaginal exam following the transabdominal exam to visualize the bilateral ovaries and adnexa. Color and duplex Doppler ultrasound was utilized to evaluate blood flow to the ovaries. COMPARISON:  CT scan abdomen and pelvis performed earlier the same day. FINDINGS: The technologist noted limited exam due to morbid obesity and overlying bowel gas. Uterus Measurements: 4.1 x 4.7 x 8.4 cm = volume: 84.4 mL. No fibroids or other mass visualized. Endometrium Thickness: 9.0 mm.  No focal abnormality visualized. Right ovary Measurements: 2.2 x 2.7 x 4.4 cm. = volume: 13.5 mL. Normal appearance/no adnexal mass. Left ovary Measurements: 1.1 x 1.5 x 2.6 cm. = volume: 3.1 mL. Normal appearance/no adnexal mass. Pulsed Doppler evaluation of both ovaries demonstrates normal low-resistance arterial and venous waveforms. While demonstrable color flow and spectral Doppler in an ovary cannot completely exclude ovarian torsion, in combination with a normal grayscale appearance of the ovary, this makes torsion highly unlikely. Other findings No abnormal free fluid. IMPRESSION: *Unremarkable pelvic ultrasound and Doppler exam. Electronically Signed   By: Jules Schick M.D.   On: 11/22/2023 12:38   [place] Art/Ven Flow Abd Pelv Doppler  Result Date: 11/22/2023 CLINICAL DATA:  sudden rlq pain EXAM: TRANSABDOMINAL AND TRANSVAGINAL  ULTRASOUND OF PELVIS DOPPLER ULTRASOUND OF OVARIES TECHNIQUE: Both transabdominal and transvaginal ultrasound examinations of the pelvis were performed. Transabdominal technique was performed  for global imaging of the pelvis including uterus, ovaries, adnexal regions, and pelvic cul-de-sac. It was necessary to proceed with endovaginal exam following the transabdominal exam to visualize the bilateral ovaries and adnexa. Color and duplex Doppler ultrasound was utilized to evaluate blood flow to the ovaries. COMPARISON:  CT scan abdomen and pelvis performed earlier the same day. FINDINGS: The technologist noted limited exam due to morbid obesity and overlying bowel gas. Uterus Measurements: 4.1 x 4.7 x 8.4 cm = volume: 84.4 mL. No fibroids or other mass visualized. Endometrium Thickness: 9.0 mm.  No focal abnormality visualized. Right ovary Measurements: 2.2 x 2.7 x 4.4 cm. = volume: 13.5 mL. Normal appearance/no adnexal mass. Left ovary Measurements: 1.1 x 1.5 x 2.6 cm. = volume: 3.1 mL. Normal appearance/no adnexal mass. Pulsed Doppler evaluation of both ovaries demonstrates normal low-resistance arterial and venous waveforms. While demonstrable color flow and spectral Doppler in an ovary cannot completely exclude ovarian torsion, in combination with a normal grayscale appearance of the ovary, this makes torsion highly unlikely. Other findings No abnormal free fluid. IMPRESSION: *Unremarkable pelvic ultrasound and Doppler exam. Electronically Signed   By: Jules Schick M.D.   On: 11/22/2023 12:38   CT Renal Stone Study  Result Date: 11/22/2023 CLINICAL DATA:  Abdominal/flank pain, stone suspected EXAM: CT ABDOMEN AND PELVIS WITHOUT CONTRAST TECHNIQUE: Multidetector CT imaging of the abdomen and pelvis was performed following the standard protocol without IV contrast. RADIATION DOSE REDUCTION: This exam was performed according to the departmental dose-optimization program which includes automated exposure  control, adjustment of the mA and/or kV according to patient size and/or use of iterative reconstruction technique. COMPARISON:  None Available. FINDINGS: Evaluation is limited by lack of IV contrast. Lower chest: No acute abnormality. Hepatobiliary: Status post cholecystectomy. Unremarkable noncontrast appearance of the liver. Pancreas: No peripancreatic fat stranding. Spleen: Unremarkable. Adrenals/Urinary Tract: Adrenal glands are unremarkable. There is a 4 mm ureterolithiasis in the proximal RIGHT ureter with mild upstream hydroureteronephrosis. There are several additional punctate nonobstructing bilateral nephrolithiasis. No LEFT-sided hydronephrosis. Bladder is decompressed and unremarkable. Stomach/Bowel: No evidence of bowel obstruction. Status post appendectomy. Stomach is unremarkable. Vascular/Lymphatic: No significant vascular findings are present. No enlarged abdominal or pelvic lymph nodes. Reproductive: Uterus and bilateral adnexa are unremarkable. Other: No free air or free fluid. Musculoskeletal: Bilateral assimilation joints at the lumbosacral junction. No acute osseous abnormality. IMPRESSION: 1. There is a 4 mm ureterolithiasis in the proximal RIGHT ureter with mild upstream hydroureteronephrosis. 2. There are several additional punctate nonobstructing bilateral nephrolithiasis. Electronically Signed   By: Meda Klinefelter M.D.   On: 11/22/2023 09:10    Pending Labs Unresulted Labs (From admission, onward)     Start     Ordered   11/29/23 0500  Creatinine, serum  (enoxaparin (LOVENOX)    CrCl >/= 30 ml/min)  Weekly,   R     Comments: while on enoxaparin therapy    11/22/23 1303   11/23/23 0500  Basic metabolic panel  Tomorrow morning,   R        11/22/23 1303   11/23/23 0500  CBC  Tomorrow morning,   R        11/22/23 1303            Vitals/Pain Today's Vitals   11/22/23 1214 11/22/23 1217 11/22/23 1225 11/22/23 1240  BP:      Pulse:      Resp:      Temp:    98.3 F  (36.8 C)  TempSrc:  Oral  SpO2: 97% 100% 94%   PainSc:        Isolation Precautions No active isolations  Medications Medications  EPINEPHrine NaCl 5-0.9 MG/250ML-% premix infusion (  Not Given 11/22/23 1348)  ipratropium-albuterol (DUONEB) 0.5-2.5 (3) MG/3ML nebulizer solution 3 mL (3 mLs Nebulization Not Given 11/22/23 1216)  albuterol (PROVENTIL) (2.5 MG/3ML) 0.083% nebulizer solution (  Not Given 11/22/23 1225)  albuterol (PROVENTIL) (2.5 MG/3ML) 0.083% nebulizer solution 3 mL (has no administration in time range)  loratadine (CLARITIN) tablet 10 mg (10 mg Oral Given 11/22/23 1409)  diphenhydrAMINE (BENADRYL) capsule 25 mg (has no administration in time range)  EPINEPHrine (EPI-PEN) injection 0.3 mg (has no administration in time range)  famotidine (PEPCID) tablet 20 mg (20 mg Oral Not Given 11/22/23 1347)  fluticasone furoate-vilanterol (BREO ELLIPTA) 200-25 MCG/ACT 1 puff (has no administration in time range)    And  umeclidinium bromide (INCRUSE ELLIPTA) 62.5 MCG/ACT 1 puff (has no administration in time range)  montelukast (SINGULAIR) tablet 10 mg (has no administration in time range)  enoxaparin (LOVENOX) injection 40 mg (has no administration in time range)  sodium chloride flush (NS) 0.9 % injection 3 mL (3 mLs Intravenous Given 11/22/23 1349)  acetaminophen (TYLENOL) tablet 650 mg (has no administration in time range)    Or  acetaminophen (TYLENOL) suppository 650 mg (has no administration in time range)  ondansetron (ZOFRAN) tablet 4 mg (has no administration in time range)    Or  ondansetron (ZOFRAN) injection 4 mg (has no administration in time range)  methylPREDNISolone sodium succinate (SOLU-MEDROL) 40 mg/mL injection 40 mg (has no administration in time range)  HYDROmorphone (DILAUDID) injection 1 mg (1 mg Intravenous Given 11/22/23 0810)  ondansetron (ZOFRAN) injection 4 mg (4 mg Intravenous Given 11/22/23 0809)  ondansetron (ZOFRAN) injection 4 mg (4 mg  Intravenous Given 11/22/23 1013)  ketorolac (TORADOL) 15 MG/ML injection 15 mg (15 mg Intravenous Given 11/22/23 1055)  EPINEPHrine (EPI-PEN) injection 0.3 mg (0.3 mg Intramuscular Given 11/22/23 1222)  diphenhydrAMINE (BENADRYL) injection 50 mg (50 mg Intravenous Given 11/22/23 1200)  methylPREDNISolone sodium succinate (SOLU-MEDROL) 125 mg/2 mL injection 125 mg (125 mg Intravenous Given 11/22/23 1157)  ipratropium-albuterol (DUONEB) 0.5-2.5 (3) MG/3ML nebulizer solution 3 mL (3 mLs Nebulization Given 11/22/23 1202)  0.9 %  sodium chloride infusion ( Intravenous New Bag/Given 11/22/23 1300)  sodium chloride 0.9 % bolus 250 mL (250 mLs Intravenous New Bag/Given 11/22/23 1214)  ipratropium-albuterol (DUONEB) 0.5-2.5 (3) MG/3ML nebulizer solution (3 mLs  Given 11/22/23 1214)  ipratropium-albuterol (DUONEB) 0.5-2.5 (3) MG/3ML nebulizer solution 3 mL (3 mLs Nebulization Given 11/22/23 1216)  albuterol (PROVENTIL) (2.5 MG/3ML) 0.083% nebulizer solution 10 mg (10 mg Nebulization Given 11/22/23 1225)    Mobility walks     Focused Assessments Cardiac Assessment Handoff:    No results found for: "CKTOTAL", "CKMB", "CKMBINDEX", "TROPONINI" Lab Results  Component Value Date   DDIMER 0.38 08/26/2023   Does the Patient currently have chest pain? No    R Recommendations: See Admitting Provider Note  Report given to:   Additional Notes:

## 2023-11-22 NOTE — ED Provider Notes (Signed)
[place] EMERGENCY DEPARTMENT AT [facility] Provider Note   CSN: [id number] Arrival date & time: 11/22/23  4098     History {Add pertinent medical, surgical, social history, OB history to HPI:1} Chief Complaint  Patient presents with   Abdominal Pain    Lower right quadrant    Holly Nielsen is a 32 y.o. female.  HPI Patient reports he went to bed well yesterday evening.  She awakened suddenly at 5 AM with severe pain in her right lower quadrant.  She reports pain is intense and sharp.  She reports it made her nauseated and she vomited.  She has not had any blood in her urine, no fevers, no chills, no vaginal discharge, no dysuria.  Patient reports she does have a history of a kidney stone.  She has already had appendectomy and cholecystectomy.  Pain is very low in the pelvis.  She tried a Zofran at home but immediately vomited after taking it.  Patient was brought by EMS.  No medications administered en route.    Home Medications Prior to Admission medications   Medication Sig Start Date End Date Taking? Authorizing Provider  albuterol (PROVENTIL) (2.5 MG/3ML) 0.083% nebulizer solution Take 3 mLs by nebulization every 6 (six) hours as needed for wheezing or shortness of breath. 10/13/23 01/11/24  Carollee Herter, DO  albuterol (VENTOLIN HFA) 108 (90 Base) MCG/ACT inhaler Inhale 2 puffs into the lungs every 6 hours as needed for wheezing. 11/02/23   Parrett, Virgel Bouquet, NP  cetirizine (ZYRTEC) 10 MG tablet Take 10 mg by mouth daily.    [provider]  diphenhydrAMINE (BENADRYL) 25 mg capsule Take 1 capsule (25 mg total) by mouth every 6 (six) hours as needed for itching. 10/13/23   Carollee Herter, DO  EPINEPHrine 0.3 mg/0.3 mL IJ SOAJ injection Inject 0.3 mg into the muscle as needed for anaphylaxis. 08/14/23   Luciano Cutter, MD  famotidine (PEPCID) 20 MG tablet Take 1 tablet (20 mg total) by mouth 2 (two) times daily. 10/13/23 11/12/23  Carollee Herter, DO   fluticasone-salmeterol (ADVAIR) 500-50 MCG/ACT AEPB Inhale 1 puff into the lungs in the morning and at bedtime. Patient not taking: Reported on 11/02/2023    [provider]  Fluticasone-Umeclidin-Vilant (TRELEGY ELLIPTA) 200-62.5-25 MCG/ACT AEPB Inhale 1 Puff into the lungs once daily . Rinse mouth after use Patient not taking: Reported on 11/02/2023 10/16/23     Fluticasone-Umeclidin-Vilant (TRELEGY ELLIPTA) 200-62.5-25 MCG/ACT AEPB Inhale 1 puff into the lungs daily at 6 (six) AM. 11/02/23   Parrett, Tammy S, NP  montelukast (SINGULAIR) 10 MG tablet Take 1 tablet (10 mg total) by mouth at bedtime. 11/02/23   Parrett, Virgel Bouquet, NP  naproxen (NAPROSYN) 375 MG tablet Take 1 tablet (375 mg total) by mouth 2 (two) times daily. 10/06/23   Kommor, Madison, MD  PARoxetine (PAXIL-CR) 12.5 MG 24 hr tablet Take 12.5 mg by mouth every morning. 07/08/21   [provider]      Allergies    Capsicum, Penicillins, Buspirone, Other, Celery oil, Iron sucrose, Nutricap [actical], Pedi-pre tape spray [wound dressing adhesive], and Anise extract [flavoring agent]    Review of Systems   Review of Systems  Physical Exam Updated Vital Signs BP (!) 162/137 (BP Location: Left Arm)   Pulse 83   Temp 97.9 F (36.6 C) (Oral)   Resp 20   SpO2 99%  Physical Exam Constitutional:      Comments: Patient is alert and nontoxic.  She is in  severe pain.  Mental status clear.  No respiratory distress.  HENT:     Mouth/Throat:     Pharynx: Oropharynx is clear.  Eyes:     Extraocular Movements: Extraocular movements intact.  Cardiovascular:     Rate and Rhythm: Normal rate and regular rhythm.  Pulmonary:     Effort: Pulmonary effort is normal.     Breath sounds: Normal breath sounds.  Abdominal:     Comments: Abdomen soft.  Moderately reproducible right lower quadrant pain.  No guarding.  Pain is focal.  No diffuse peritoneal signs.  Musculoskeletal:        General: Normal range of motion.  Skin:     General: Skin is warm and dry.  Neurological:     General: No focal deficit present.     Mental Status: She is oriented to person, place, and time.     Coordination: Coordination normal.     ED Results / Procedures / Treatments   Labs (all labs ordered are listed, but only abnormal results are displayed) Labs Reviewed  COMPREHENSIVE METABOLIC PANEL  CBC WITH DIFFERENTIAL/PLATELET  URINALYSIS, ROUTINE W REFLEX MICROSCOPIC  HCG, SERUM, QUALITATIVE    EKG None  Radiology No results found.  Procedures Procedures  {Document cardiac monitor, telemetry assessment procedure when appropriate:1}  Medications Ordered in ED Medications  HYDROmorphone (DILAUDID) injection 1 mg (has no administration in time range)  ondansetron (ZOFRAN) injection 4 mg (has no administration in time range)    ED Course/ Medical Decision Making/ A&P   {   Click here for ABCD2, HEART and other calculatorsREFRESH Note before signing :1}                              Medical Decision Making Amount and/or Complexity of Data Reviewed Labs: ordered. Radiology: ordered.  Risk Prescription drug management.   Patient presents as outlined with acute onset of pain at 5 AM.  Patient is alert with clear mental status.  Differential diagnosis includes kidney stone\ovarian torsion\ectopic\bowel obstruction.  Will proceed with pain control basic lab work and CT scan.  Patient has history of significant anaphylactic reactions and asthma.  Patient denies any intolerance or sensitivity to narcotic pain control.  She has used Zofran in the past for control of nausea without adverse effect.   11: 45 patient was getting pelvic ultrasound.  She was in the semirecumbent position and repositioned her hip per request from ultrasound tech.  She very suddenly started developing shortness of breath, flushing and swelling of the face.  Patient reports that she felt fine and the symptoms came on almost  instantaneously.  Patient  reports she had a hospitalization and had to be in the ICU on an epi drip.  This happened previously that she had a sudden onset of severe allergic reaction of unknown cause.  Patient reports that she has never had trouble taking Toradol before.  She reports she is taking it previously and she did not notice any signs of allergy developing after it had been administered.  Reports she cannot take Pepcid.  She reports she tolerated the Benadryl and Solu-Medrol previously.  She reports she also had magnesium.  Patient is alert with clear mental status.  She has flushing of the face and some bilateral periorbital edema.  She is speaking in full sentences.  She has some wheezing bilaterally in the lung fields which is soft.  Patient was placed on nonrebreather mask and  put in upright position.  She reports that the breathing feels somewhat better but she still feels tight and flushed.  Will treat as anaphylactic reaction and continue to monitor very closely.  Unknown trigger.  Interestingly it seemed to be triggered by being supine and positional movement.  Consideration given to Toradol reaction, however it had already been almost an hour since Toradol administered with no signs of reaction.  12: 10.  Patient has periorbital edema but is continuing to speak in short full sentences.  She reports it feels like the breathing is stabilized and improving slightly.  The symptoms are not progressing at this time.  Will continue aggressive treatment for full full access\sudden status asthmaticus.  Patient reports this is the fourth time this has happened.  No trigger has ever been identified. {Document critical care time when appropriate:1} {Document review of labs and clinical decision tools ie heart score, Chads2Vasc2 etc:1}  {Document your independent review of radiology images, and any outside records:1} {Document your discussion with family members, caretakers, and with consultants:1} {Document social determinants  of health affecting pt's care:1} {Document your decision making why or why not admission, treatments were needed:1} Final Clinical Impression(s) / ED Diagnoses Final diagnoses:  None    Rx / DC Orders ED Discharge Orders     None

## 2023-11-22 NOTE — Plan of Care (Signed)
Admitted from Emergency Department. Placed on cardiac monitor.   Problem: Education: Goal: Knowledge of General Education information will improve Description: Including pain rating scale, medication(s)/side effects and non-pharmacologic comfort measures Outcome: Progressing   Problem: Health Behavior/Discharge Planning: Goal: Ability to manage health-related needs will improve Outcome: Progressing   Problem: Clinical Measurements: Goal: Ability to maintain clinical measurements within normal limits will improve Outcome: Progressing Goal: Will remain free from infection Outcome: Progressing Goal: Diagnostic test results will improve Outcome: Progressing Goal: Respiratory complications will improve Outcome: Progressing Goal: Cardiovascular complication will be avoided Outcome: Progressing

## 2023-11-23 DIAGNOSIS — R609 Edema, unspecified: Secondary | ICD-10-CM | POA: Diagnosis not present

## 2023-11-23 DIAGNOSIS — Z823 Family history of stroke: Secondary | ICD-10-CM | POA: Diagnosis not present

## 2023-11-23 DIAGNOSIS — T782XXA Anaphylactic shock, unspecified, initial encounter: Secondary | ICD-10-CM | POA: Diagnosis not present

## 2023-11-23 DIAGNOSIS — E66813 Obesity, class 3: Secondary | ICD-10-CM | POA: Diagnosis not present

## 2023-11-23 DIAGNOSIS — E872 Acidosis, unspecified: Secondary | ICD-10-CM | POA: Diagnosis not present

## 2023-11-23 DIAGNOSIS — T886XXA Anaphylactic reaction due to adverse effect of correct drug or medicament properly administered, initial encounter: Secondary | ICD-10-CM | POA: Diagnosis not present

## 2023-11-23 DIAGNOSIS — F419 Anxiety disorder, unspecified: Secondary | ICD-10-CM | POA: Diagnosis not present

## 2023-11-23 DIAGNOSIS — Z7951 Long term (current) use of inhaled steroids: Secondary | ICD-10-CM | POA: Diagnosis not present

## 2023-11-23 DIAGNOSIS — Z888 Allergy status to other drugs, medicaments and biological substances status: Secondary | ICD-10-CM | POA: Diagnosis not present

## 2023-11-23 DIAGNOSIS — Z6841 Body Mass Index (BMI) 40.0 and over, adult: Secondary | ICD-10-CM | POA: Diagnosis not present

## 2023-11-23 DIAGNOSIS — N179 Acute kidney failure, unspecified: Secondary | ICD-10-CM | POA: Insufficient documentation

## 2023-11-23 DIAGNOSIS — Z833 Family history of diabetes mellitus: Secondary | ICD-10-CM | POA: Diagnosis not present

## 2023-11-23 DIAGNOSIS — T398X5A Adverse effect of other nonopioid analgesics and antipyretics, not elsewhere classified, initial encounter: Secondary | ICD-10-CM | POA: Diagnosis not present

## 2023-11-23 DIAGNOSIS — N132 Hydronephrosis with renal and ureteral calculous obstruction: Secondary | ICD-10-CM | POA: Diagnosis not present

## 2023-11-23 DIAGNOSIS — E871 Hypo-osmolality and hyponatremia: Secondary | ICD-10-CM | POA: Diagnosis not present

## 2023-11-23 DIAGNOSIS — Y848 Other medical procedures as the cause of abnormal reaction of the patient, or of later complication, without mention of misadventure at the time of the procedure: Secondary | ICD-10-CM | POA: Diagnosis present

## 2023-11-23 DIAGNOSIS — J9601 Acute respiratory failure with hypoxia: Secondary | ICD-10-CM

## 2023-11-23 DIAGNOSIS — Z9102 Food additives allergy status: Secondary | ICD-10-CM | POA: Diagnosis not present

## 2023-11-23 DIAGNOSIS — D841 Defects in the complement system: Secondary | ICD-10-CM | POA: Diagnosis not present

## 2023-11-23 DIAGNOSIS — Z886 Allergy status to analgesic agent status: Secondary | ICD-10-CM | POA: Diagnosis not present

## 2023-11-23 DIAGNOSIS — Z91018 Allergy to other foods: Secondary | ICD-10-CM | POA: Diagnosis not present

## 2023-11-23 DIAGNOSIS — Z91048 Other nonmedicinal substance allergy status: Secondary | ICD-10-CM | POA: Diagnosis not present

## 2023-11-23 DIAGNOSIS — Z880 Allergy status to penicillin: Secondary | ICD-10-CM | POA: Diagnosis not present

## 2023-11-23 DIAGNOSIS — J45901 Unspecified asthma with (acute) exacerbation: Secondary | ICD-10-CM | POA: Diagnosis not present

## 2023-11-23 DIAGNOSIS — N200 Calculus of kidney: Secondary | ICD-10-CM | POA: Diagnosis not present

## 2023-11-23 DIAGNOSIS — T783XXA Angioneurotic edema, initial encounter: Secondary | ICD-10-CM | POA: Diagnosis not present

## 2023-11-23 DIAGNOSIS — Z79899 Other long term (current) drug therapy: Secondary | ICD-10-CM | POA: Diagnosis not present

## 2023-11-23 DIAGNOSIS — Z825 Family history of asthma and other chronic lower respiratory diseases: Secondary | ICD-10-CM | POA: Diagnosis not present

## 2023-11-23 HISTORY — DX: Acute kidney failure, unspecified: N17.9

## 2023-11-23 HISTORY — DX: Hydronephrosis with renal and ureteral calculous obstruction: N13.2

## 2023-11-23 HISTORY — DX: Acute respiratory failure with hypoxia: J96.01

## 2023-11-23 LAB — BASIC METABOLIC PANEL
Anion gap: 10 (ref 5–15)
Anion gap: 8 (ref 5–15)
BUN: 24 mg/dL — ABNORMAL HIGH (ref 6–20)
BUN: 26 mg/dL — ABNORMAL HIGH (ref 6–20)
CO2: 19 mmol/L — ABNORMAL LOW (ref 22–32)
CO2: 19 mmol/L — ABNORMAL LOW (ref 22–32)
Calcium: 9.1 mg/dL (ref 8.9–10.3)
Calcium: 8.8 mg/dL — ABNORMAL LOW (ref 8.9–10.3)
Chloride: 107 mmol/L (ref 98–111)
Chloride: 105 mmol/L (ref 98–111)
Creatinine, Ser: 1.45 mg/dL — ABNORMAL HIGH (ref 0.44–1.00)
Creatinine, Ser: 1.62 mg/dL — ABNORMAL HIGH (ref 0.44–1.00)
GFR, Estimated: 49 mL/min — ABNORMAL LOW (ref >60)
GFR, Estimated: 43 mL/min — ABNORMAL LOW (ref >60)
Glucose, Bld: 155 mg/dL — ABNORMAL HIGH (ref 70–99)
Glucose, Bld: 185 mg/dL — ABNORMAL HIGH (ref 70–99)
Potassium: 4.5 mmol/L (ref 3.5–5.1)
Potassium: 4.4 mmol/L (ref 3.5–5.1)
Sodium: 136 mmol/L (ref 135–145)
Sodium: 132 mmol/L — ABNORMAL LOW (ref 135–145)

## 2023-11-23 LAB — CBC
HCT: 39.4 % (ref 36.0–46.0)
Hemoglobin: 12.2 g/dL (ref 12.0–15.0)
MCH: 26.1 pg (ref 26.0–34.0)
MCHC: 31 g/dL (ref 30.0–36.0)
MCV: 84.2 fL (ref 80.0–100.0)
Platelets: 233 10*3/uL (ref 150–400)
RBC: 4.68 MIL/uL (ref 3.87–5.11)
RDW: 15.4 % (ref 11.5–15.5)
WBC: 15.5 10*3/uL — ABNORMAL HIGH (ref 4.0–10.5)
nRBC: 0 % (ref 0.0–0.2)

## 2023-11-23 MED ORDER — CARMEX CLASSIC LIP BALM EX OINT
TOPICAL_OINTMENT | CUTANEOUS | Status: DC | PRN
  Filled 2023-11-23 (×2): qty 10

## 2023-11-23 MED ORDER — SODIUM CHLORIDE 0.9 % IV BOLUS
1000.0000 mL | Freq: Once | INTRAVENOUS | Status: AC
  Administered 2023-11-23: 1000 mL via INTRAVENOUS

## 2023-11-23 MED ORDER — OXYCODONE HCL 5 MG PO TABS
10.0000 mg | ORAL_TABLET | Freq: Four times a day (QID) | ORAL | Status: DC | PRN
  Administered 2023-11-23 – 2023-11-24 (×2): 10 mg via ORAL
  Filled 2023-11-23 (×2): qty 2

## 2023-11-23 MED ORDER — SODIUM CHLORIDE 0.9 % IV SOLN: INTRAVENOUS | Status: AC

## 2023-11-23 MED ORDER — HYDROMORPHONE HCL 1 MG/ML IJ SOLN
0.5000 mg | Freq: Once | INTRAMUSCULAR | Status: AC | PRN
  Administered 2023-11-23: 0.5 mg via INTRAVENOUS
  Filled 2023-11-23: qty 1

## 2023-11-23 NOTE — Progress Notes (Signed)
PROGRESS NOTE    Holly Nielsen  MVH:[id number] DOB: [birth year] DOA: 11/22/2023 PCP: Pcp, No   Brief Narrative:  HPI: Holly Nielsen is a 32 y.o. female with medical history significant of recurrent anaphylaxis, asthma, obesity, iron deficiency anemia initially presented to ED with complaint of right lower quadrant pain which started suddenly at 5 AM.  The pain was crampy, intermittent, nonradiating, no aggravating or relieving factor and was 10 out of 10.  No associated fever, chills, sweating, nausea, vomiting, any problem with urination or with bowel movement.  No recent travel or sick contact.  No other complaint.   ED Course: Upon arrival to ED, she was hemodynamically stable with slightly elevated diastolic blood pressure.  CT abdomen and pelvis showed small 4 mm right ureteric stone causing right mild hydroureteronephrosis.  She received Toradol.  An hour later, out of concern of ovarian torsion, while she was undergoing pelvic ultrasound, patient had sudden anaphylactic, intriguing agent is not known.  Patient was noted to have wheezing and periorbital edema.  She then received several medications including Benadryl, albuterol nebulizer which is continuing right now, epinephrine, DuoNeb, Solu-Medrol, Zofran as well as IV fluids.  Hospital service was consulted for further admission and management.  Assessment & Plan:   Principal Problem:   Anaphylaxis Active Problems:   Morbid obesity with BMI of 50.0-59.9, adult (HCC)   Acute asthma exacerbation   Ureteral stone with hydronephrosis   AKI (acute kidney injury) (HCC)   Acute respiratory failure with hypoxia (HCC)  Anaphylactic reaction: Source remains unknown.  Her periorbital edema has improved but she is still does not feel well and had flushing while walking in the ICU.  Acute hypoxic respiratory failure secondary to acute asthma exacerbation: Still complains of shortness of breath however she is not requiring any oxygen at rest  and no wheezes on examination.  Continue Solu-Medrol.  Ureteral stone with right hydroureteronephrosis/AKI: Creatinine was normal yesterday.  Creatinine is rising.  IV fluids given.  Complaining of pain.  Tylenol is not helping.  Oxycodone ordered.  Repeat labs in the morning.  If evidence of worsening creatinine, consider urology consultation.  Class III morbid obesity: Weight loss and dietary modification counseled.  Mild hyponatremia with mild metabolic acidosis: Could be hypovolemic.  IV fluids.  DVT prophylaxis: enoxaparin (LOVENOX) injection 40 mg Start: 11/22/23 1400   Code Status: Full Code  Family Communication:  None present at bedside.  Plan of care discussed with patient in length and he/she verbalized understanding and agreed with it.  Status is: Inpatient Remains inpatient appropriate because: Still symptomatic and now has AKI.   Estimated body mass index is 55.28 kg/m as calculated from the following:   Height as of this encounter: 5' (1.524 m).   Weight as of this encounter: 128.4 kg.    Nutritional Assessment: Body mass index is 55.28 kg/m.Marland Kitchen Seen by dietician.  I agree with the assessment and plan as outlined below: Nutrition Status:        . Skin Assessment: I have examined the patient's skin and I agree with the wound assessment as performed by the wound care RN as outlined below:    Consultants:  None  Procedures:  None  Antimicrobials:  Anti-infectives (From admission, onward)    None         Subjective: Patient seen and examined.  She was still complaining of shortness of breath, right flank pain.  No other complaint.  Objective: Vitals:   11/23/23 0800 11/23/23 0843 11/23/23 1200  11/23/23 1511  BP: (!) 161/92     Pulse: (!) 103     Resp: 19     Temp: 98.5 F (36.9 C)  97.9 F (36.6 C) 97.9 F (36.6 C)  TempSrc: Oral  Oral Oral  SpO2: 95% 95%    Weight:      Height:        Intake/Output Summary (Last 24 hours) at 11/23/2023  1622 Last data filed at 11/23/2023 1500 Gross per 24 hour  Intake 723 ml  Output 1550 ml  Net -827 ml   Filed Weights   11/22/23 1600 11/23/23 0406  Weight: 132.6 kg 128.4 kg    Examination:  General exam: Appears calm and comfortable, morbidly obese, periorbital edema has improved significantly Respiratory system: Clear to auscultation. Respiratory effort normal. Cardiovascular system: S1 & S2 heard, RRR. No JVD, murmurs, rubs, gallops or clicks. No pedal edema. Gastrointestinal system: Abdomen is nondistended, soft and nontender. No organomegaly or masses felt. Normal bowel sounds heard. Central nervous system: Alert and oriented. No focal neurological deficits. Extremities: Symmetric 5 x 5 power. Skin: No rashes, lesions or ulcers Psychiatry: Judgement and insight appear normal. Mood & affect appropriate.    Data Reviewed: I have personally reviewed following labs and imaging studies  CBC: Recent Labs  Lab 11/22/23 0752 11/23/23 0320  WBC 10.6* 15.5*  NEUTROABS 8.1*  --   HGB 13.2 12.2  HCT 40.5 39.4  MCV 80.7 84.2  PLT 225 233   Basic Metabolic Panel: Recent Labs  Lab 11/22/23 0752 11/23/23 0320 11/23/23 1305  NA 136 136 132*  K 4.3 4.5 4.4  CL 104 107 105  CO2 22 19* 19*  GLUCOSE 135* 155* 185*  BUN 17 24* 26*  CREATININE 0.92  0.90 1.45* 1.62*  CALCIUM 9.2 9.1 8.8*  MG 2.2  --   --    GFR: Estimated Creatinine Clearance: 62.5 mL/min (A) (by C-G formula based on SCr of 1.62 mg/dL (H)). Liver Function Tests: Recent Labs  Lab 11/22/23 0752  AST 25  ALT 27  ALKPHOS 63  BILITOT 0.3  PROT 7.9  ALBUMIN 4.2   No results for input(s): "LIPASE", "AMYLASE" in the last 168 hours. No results for input(s): "AMMONIA" in the last 168 hours. Coagulation Profile: No results for input(s): "INR", "PROTIME" in the last 168 hours. Cardiac Enzymes: No results for input(s): "CKTOTAL", "CKMB", "CKMBINDEX", "TROPONINI" in the last 168 hours. BNP (last 3  results) No results for input(s): "PROBNP" in the last 8760 hours. HbA1C: No results for input(s): "HGBA1C" in the last 72 hours. CBG: No results for input(s): "GLUCAP" in the last 168 hours. Lipid Profile: No results for input(s): "CHOL", "HDL", "LDLCALC", "TRIG", "CHOLHDL", "LDLDIRECT" in the last 72 hours. Thyroid Function Tests: No results for input(s): "TSH", "T4TOTAL", "FREET4", "T3FREE", "THYROIDAB" in the last 72 hours. Anemia Panel: No results for input(s): "VITAMINB12", "FOLATE", "FERRITIN", "TIBC", "IRON", "RETICCTPCT" in the last 72 hours. Sepsis Labs: No results for input(s): "PROCALCITON", "LATICACIDVEN" in the last 168 hours.  Recent Results (from the past 240 hour(s))  MRSA Next Gen by PCR, Nasal     Status: None   Collection Time: 11/22/23  3:22 PM   Specimen: Nasal Mucosa; Nasal Swab  Result Value Ref Range Status   MRSA by PCR Next Gen NOT DETECTED NOT DETECTED Final    Comment: (NOTE) The GeneXpert MRSA Assay (FDA approved for NASAL specimens only), is one component of a comprehensive MRSA colonization surveillance program. It is not intended  to diagnose MRSA infection nor to guide or monitor treatment for MRSA infections. Test performance is not FDA approved in patients less than 29 years old. Performed at [facility], [address], [place], [place] [address]      Radiology Studies: US Pelvis Complete  Result Date: 11/22/2023 CLINICAL DATA:  sudden rlq pain EXAM: TRANSABDOMINAL AND TRANSVAGINAL ULTRASOUND OF PELVIS DOPPLER ULTRASOUND OF OVARIES TECHNIQUE: Both transabdominal and transvaginal ultrasound examinations of the pelvis were performed. Transabdominal technique was performed for global imaging of the pelvis including uterus, ovaries, adnexal regions, and pelvic cul-de-sac. It was necessary to proceed with endovaginal exam following the transabdominal exam to visualize the bilateral ovaries and adnexa. Color and duplex Doppler  ultrasound was utilized to evaluate blood flow to the ovaries. COMPARISON:  CT scan abdomen and pelvis performed earlier the same day. FINDINGS: The technologist noted limited exam due to morbid obesity and overlying bowel gas. Uterus Measurements: 4.1 x 4.7 x 8.4 cm = volume: 84.4 mL. No fibroids or other mass visualized. Endometrium Thickness: 9.0 mm.  No focal abnormality visualized. Right ovary Measurements: 2.2 x 2.7 x 4.4 cm. = volume: 13.5 mL. Normal appearance/no adnexal mass. Left ovary Measurements: 1.1 x 1.5 x 2.6 cm. = volume: 3.1 mL. Normal appearance/no adnexal mass. Pulsed Doppler evaluation of both ovaries demonstrates normal low-resistance arterial and venous waveforms. While demonstrable color flow and spectral Doppler in an ovary cannot completely exclude ovarian torsion, in combination with a normal grayscale appearance of the ovary, this makes torsion highly unlikely. Other findings No abnormal free fluid. IMPRESSION: *Unremarkable pelvic ultrasound and Doppler exam. Electronically Signed   By: Jules Schick M.D.   On: 11/22/2023 12:38   US Transvaginal Non-OB  Result Date: 11/22/2023 CLINICAL DATA:  sudden rlq pain EXAM: TRANSABDOMINAL AND TRANSVAGINAL ULTRASOUND OF PELVIS DOPPLER ULTRASOUND OF OVARIES TECHNIQUE: Both transabdominal and transvaginal ultrasound examinations of the pelvis were performed. Transabdominal technique was performed for global imaging of the pelvis including uterus, ovaries, adnexal regions, and pelvic cul-de-sac. It was necessary to proceed with endovaginal exam following the transabdominal exam to visualize the bilateral ovaries and adnexa. Color and duplex Doppler ultrasound was utilized to evaluate blood flow to the ovaries. COMPARISON:  CT scan abdomen and pelvis performed earlier the same day. FINDINGS: The technologist noted limited exam due to morbid obesity and overlying bowel gas. Uterus Measurements: 4.1 x 4.7 x 8.4 cm = volume: 84.4 mL. No fibroids or  other mass visualized. Endometrium Thickness: 9.0 mm.  No focal abnormality visualized. Right ovary Measurements: 2.2 x 2.7 x 4.4 cm. = volume: 13.5 mL. Normal appearance/no adnexal mass. Left ovary Measurements: 1.1 x 1.5 x 2.6 cm. = volume: 3.1 mL. Normal appearance/no adnexal mass. Pulsed Doppler evaluation of both ovaries demonstrates normal low-resistance arterial and venous waveforms. While demonstrable color flow and spectral Doppler in an ovary cannot completely exclude ovarian torsion, in combination with a normal grayscale appearance of the ovary, this makes torsion highly unlikely. Other findings No abnormal free fluid. IMPRESSION: *Unremarkable pelvic ultrasound and Doppler exam. Electronically Signed   By: Jules Schick M.D.   On: 11/22/2023 12:38   [place] Art/Ven Flow Abd Pelv Doppler  Result Date: 11/22/2023 CLINICAL DATA:  sudden rlq pain EXAM: TRANSABDOMINAL AND TRANSVAGINAL ULTRASOUND OF PELVIS DOPPLER ULTRASOUND OF OVARIES TECHNIQUE: Both transabdominal and transvaginal ultrasound examinations of the pelvis were performed. Transabdominal technique was performed for global imaging of the pelvis including uterus, ovaries, adnexal regions, and pelvic cul-de-sac. It was necessary  to proceed with endovaginal exam following the transabdominal exam to visualize the bilateral ovaries and adnexa. Color and duplex Doppler ultrasound was utilized to evaluate blood flow to the ovaries. COMPARISON:  CT scan abdomen and pelvis performed earlier the same day. FINDINGS: The technologist noted limited exam due to morbid obesity and overlying bowel gas. Uterus Measurements: 4.1 x 4.7 x 8.4 cm = volume: 84.4 mL. No fibroids or other mass visualized. Endometrium Thickness: 9.0 mm.  No focal abnormality visualized. Right ovary Measurements: 2.2 x 2.7 x 4.4 cm. = volume: 13.5 mL. Normal appearance/no adnexal mass. Left ovary Measurements: 1.1 x 1.5 x 2.6 cm. = volume: 3.1 mL. Normal appearance/no adnexal mass. Pulsed  Doppler evaluation of both ovaries demonstrates normal low-resistance arterial and venous waveforms. While demonstrable color flow and spectral Doppler in an ovary cannot completely exclude ovarian torsion, in combination with a normal grayscale appearance of the ovary, this makes torsion highly unlikely. Other findings No abnormal free fluid. IMPRESSION: *Unremarkable pelvic ultrasound and Doppler exam. Electronically Signed   By: Jules Schick M.D.   On: 11/22/2023 12:38   CT Renal Stone Study  Result Date: 11/22/2023 CLINICAL DATA:  Abdominal/flank pain, stone suspected EXAM: CT ABDOMEN AND PELVIS WITHOUT CONTRAST TECHNIQUE: Multidetector CT imaging of the abdomen and pelvis was performed following the standard protocol without IV contrast. RADIATION DOSE REDUCTION: This exam was performed according to the departmental dose-optimization program which includes automated exposure control, adjustment of the mA and/or kV according to patient size and/or use of iterative reconstruction technique. COMPARISON:  None Available. FINDINGS: Evaluation is limited by lack of IV contrast. Lower chest: No acute abnormality. Hepatobiliary: Status post cholecystectomy. Unremarkable noncontrast appearance of the liver. Pancreas: No peripancreatic fat stranding. Spleen: Unremarkable. Adrenals/Urinary Tract: Adrenal glands are unremarkable. There is a 4 mm ureterolithiasis in the proximal RIGHT ureter with mild upstream hydroureteronephrosis. There are several additional punctate nonobstructing bilateral nephrolithiasis. No LEFT-sided hydronephrosis. Bladder is decompressed and unremarkable. Stomach/Bowel: No evidence of bowel obstruction. Status post appendectomy. Stomach is unremarkable. Vascular/Lymphatic: No significant vascular findings are present. No enlarged abdominal or pelvic lymph nodes. Reproductive: Uterus and bilateral adnexa are unremarkable. Other: No free air or free fluid. Musculoskeletal: Bilateral  assimilation joints at the lumbosacral junction. No acute osseous abnormality. IMPRESSION: 1. There is a 4 mm ureterolithiasis in the proximal RIGHT ureter with mild upstream hydroureteronephrosis. 2. There are several additional punctate nonobstructing bilateral nephrolithiasis. Electronically Signed   By: Meda Klinefelter M.D.   On: 11/22/2023 09:10    Scheduled Meds:  Chlorhexidine Gluconate Cloth  6 each Topical Daily   enoxaparin (LOVENOX) injection  40 mg Subcutaneous Daily   famotidine  20 mg Oral BID   fluticasone furoate-vilanterol  1 puff Inhalation Daily   And   umeclidinium bromide  1 puff Inhalation Daily   ipratropium-albuterol  3 mL Nebulization Once   loratadine  10 mg Oral Daily   methylPREDNISolone (SOLU-MEDROL) injection  40 mg Intravenous BID   montelukast  10 mg Oral QHS   sodium chloride flush  3 mL Intravenous Q12H   Continuous Infusions:  sodium chloride 100 mL/hr at 11/23/23 1548     LOS: 0 days   Hughie Closs, MD [facility]  11/23/2023, 4:22 PM   *Please note that this is a verbal dictation therefore any spelling or grammatical errors are due to the "[employer]agon [employer] One" system interpretation.  Please page via Amion and do not message via secure chat for urgent patient care matters. Secure chat  can be used for non urgent patient care matters.  How to contact the [facility] Attending or Consulting provider 7A - 7P or covering provider during after hours 7P -7A, for this patient?  Check the care team in [facility] and look for a) attending/consulting [facility] provider listed and b) the [facility] team listed. Page or secure chat 7A-7P. Log into [email or web address] and use [facility]'s universal password to access. If you do not have the password, please contact the hospital operator. Locate the [facility] provider you are looking for under [facility] and page to a number that you can be directly reached. If you still have difficulty reaching the provider, please page the [facility]  (Director on Call) for the Hospitalists listed on amion for assistance.

## 2023-11-24 ENCOUNTER — Other Ambulatory Visit: Payer: Self-pay

## 2023-11-24 DIAGNOSIS — T782XXA Anaphylactic shock, unspecified, initial encounter: Secondary | ICD-10-CM | POA: Diagnosis not present

## 2023-11-24 LAB — CBC WITH DIFFERENTIAL/PLATELET
Abs Immature Granulocytes: 0.16 10*3/uL — ABNORMAL HIGH (ref 0.00–0.07)
Basophils Absolute: 0 10*3/uL (ref 0.0–0.1)
Basophils Relative: 0 %
Eosinophils Absolute: 0 10*3/uL (ref 0.0–0.5)
Eosinophils Relative: 0 %
HCT: 35.8 % — ABNORMAL LOW (ref 36.0–46.0)
Hemoglobin: 11.5 g/dL — ABNORMAL LOW (ref 12.0–15.0)
Immature Granulocytes: 1 %
Lymphocytes Relative: 6 %
Lymphs Abs: 1 10*3/uL (ref 0.7–4.0)
MCH: 26.8 pg (ref 26.0–34.0)
MCHC: 32.1 g/dL (ref 30.0–36.0)
MCV: 83.4 fL (ref 80.0–100.0)
Monocytes Absolute: 0.5 10*3/uL (ref 0.1–1.0)
Monocytes Relative: 3 %
Neutro Abs: 14.4 10*3/uL — ABNORMAL HIGH (ref 1.7–7.7)
Neutrophils Relative %: 90 %
Platelets: 219 10*3/uL (ref 150–400)
RBC: 4.29 MIL/uL (ref 3.87–5.11)
RDW: 16.1 % — ABNORMAL HIGH (ref 11.5–15.5)
WBC: 16.1 10*3/uL — ABNORMAL HIGH (ref 4.0–10.5)
nRBC: 0 % (ref 0.0–0.2)

## 2023-11-24 LAB — BASIC METABOLIC PANEL
Anion gap: 7 (ref 5–15)
BUN: 33 mg/dL — ABNORMAL HIGH (ref 6–20)
CO2: 21 mmol/L — ABNORMAL LOW (ref 22–32)
Calcium: 8.8 mg/dL — ABNORMAL LOW (ref 8.9–10.3)
Chloride: 108 mmol/L (ref 98–111)
Creatinine, Ser: 1.37 mg/dL — ABNORMAL HIGH (ref 0.44–1.00)
GFR, Estimated: 53 mL/min — ABNORMAL LOW (ref >60)
Glucose, Bld: 142 mg/dL — ABNORMAL HIGH (ref 70–99)
Potassium: 4.7 mmol/L (ref 3.5–5.1)
Sodium: 136 mmol/L (ref 135–145)

## 2023-11-24 MED ORDER — OXYCODONE HCL 5 MG PO TABS
10.0000 mg | ORAL_TABLET | ORAL | Status: DC | PRN
  Administered 2023-11-24 (×2): 10 mg via ORAL
  Filled 2023-11-24 (×2): qty 2

## 2023-11-24 NOTE — Progress Notes (Signed)
PROGRESS NOTE    Holly Nielsen  WNI:[id number] DOB: [birth year] DOA: 11/22/2023 PCP: Pcp, No   Brief Narrative:  Holly Nielsen is a 32 y.o. female with medical history significant of recurrent anaphylaxis, asthma, obesity, iron deficiency anemia initially presented to ED with complaint of right lower quadrant pain which started suddenly at 5 AM on day of presentation. No associated fever, chills, sweating, nausea, vomiting, any problem with urination or with bowel movement.  No recent travel or sick contact.     Upon arrival to ED, she was hemodynamically stable with slightly elevated diastolic blood pressure.  CT abdomen and pelvis showed small 4 mm right ureteric stone causing right mild hydroureteronephrosis.  She received Toradol.  An hour later, out of concern of ovarian torsion, while she was undergoing pelvic ultrasound, patient had sudden anaphylactic, intriguing agent is not known.  Patient was noted to have wheezing and periorbital edema.  She then received several medications including Benadryl, albuterol nebulizer which is continuing right now, epinephrine, DuoNeb, Solu-Medrol, Zofran as well as IV fluids and admitted to hospital service.  Further details below.  Assessment & Plan:   Principal Problem:   Anaphylaxis Active Problems:   Morbid obesity with BMI of 50.0-59.9, adult (HCC)   Acute asthma exacerbation   Ureteral stone with hydronephrosis   AKI (acute kidney injury) (HCC)   Acute respiratory failure with hypoxia (HCC)  Anaphylactic reaction: Source remains unknown.  Her periorbital edema totally improved yesterday.  However earlier today around 8 AM, she had another episode of anaphylaxis where she was noted to have flushing and hives and complaint of shortness of breath.  She had diminished breath sounds but no wheezes.  Rapid response was called.  I saw her at the bedside right away as well.  She was still able to speak in full sentences.  Luckily this time she did not  have periorbital edema.  She received Benadryl, Solu-Medrol and other medications.  She told us that she is allergic to Pepcid so we have discontinued that and I have added that to her allergy list.  Of note, patient has seen an allergist and she was told that she has vocal cord disorder and following that, she saw an ENT.  She and I both are not convinced that it is vocal cord dysfunction since her periorbital edema and flushing cannot be explained.  She was already looking for second opinion as outpatient.  Acute hypoxic respiratory failure secondary to acute asthma exacerbation: Was much better, weaned to room air yesterday, this morning she had shortness of breath but fortunately she was not hypoxic, saturating 97% on room air, did not have wheezes but diminished breath sounds.  Monitor closely.  Continue Solu-Medrol.  Ureteral stone with right hydroureteronephrosis/AKI: Creatinine was normal upon admission but had rise in creatinine after that to 1.6, started on IV fluids, improving to 1.3.  She is still complaining of the right flank pain, not controlled with oxycodone, will increase frequency of oxycodone to every 4 hours as needed.  If she were to have worsening creatinine, may consider urology consultation however it is likely that she is passing the stone which is only 4 mm and that is likely causing the pain.  Class III morbid obesity: Weight loss and dietary modification counseled.  Mild hyponatremia with mild metabolic acidosis: Could be hypovolemic.  IV fluids.  DVT prophylaxis: enoxaparin (LOVENOX) injection 40 mg Start: 11/22/23 1400   Code Status: Full Code  Family Communication:  None present at bedside.  Plan of care discussed with patient in length and he/she verbalized understanding and agreed with it.  Status is: Inpatient Remains inpatient appropriate because: Improving AKI with another anaphylactic reaction this morning.   Estimated body mass index is 55.89 kg/m as  calculated from the following:   Height as of this encounter: 5' (1.524 m).   Weight as of this encounter: 129.8 kg.    Nutritional Assessment: Body mass index is 55.89 kg/m.Marland Kitchen Seen by dietician.  I agree with the assessment and plan as outlined below: Nutrition Status:        . Skin Assessment: I have examined the patient's skin and I agree with the wound assessment as performed by the wound care RN as outlined below:    Consultants:  None  Procedures:  None  Antimicrobials:  Anti-infectives (From admission, onward)    None         Subjective: Seen and examined earlier today while she was having anaphylactic reaction as mentioned above.  Objective: Vitals:   11/24/23 0915 11/24/23 0940 11/24/23 1000 11/24/23 1030  BP: (!) 169/82 (!) 143/89 (!) 154/90 (!) 153/84  Pulse: 90     Resp:  18 20 18   Temp:      TempSrc:      SpO2:  96% 95% 97%  Weight:      Height:        Intake/Output Summary (Last 24 hours) at 11/24/2023 1103 Last data filed at 11/24/2023 1100 Gross per 24 hour  Intake 2116.26 ml  Output 1800 ml  Net 316.26 ml   Filed Weights   11/22/23 1600 11/23/23 0406 11/24/23 0500  Weight: 132.6 kg 128.4 kg 129.8 kg    Examination:  General exam: Appears to have flushing and some tachypnea.  No wheezes. Respiratory system: Clear to auscultation. Respiratory effort normal. Cardiovascular system: S1 & S2 heard, RRR. No JVD, murmurs, rubs, gallops or clicks. No pedal edema. Gastrointestinal system: Abdomen is nondistended, soft and nontender. No organomegaly or masses felt. Normal bowel sounds heard. Central nervous system: Alert and oriented. No focal neurological deficits. Extremities: Symmetric 5 x 5 power. Skin: No rashes, lesions or ulcers.   Data Reviewed: I have personally reviewed following labs and imaging studies  CBC: Recent Labs  Lab 11/22/23 0752 11/23/23 0320 11/24/23 0455  WBC 10.6* 15.5* 16.1*  NEUTROABS 8.1*  --  14.4*   HGB 13.2 12.2 11.5*  HCT 40.5 39.4 35.8*  MCV 80.7 84.2 83.4  PLT 225 233 219   Basic Metabolic Panel: Recent Labs  Lab 11/22/23 0752 11/23/23 0320 11/23/23 1305 11/24/23 0455  NA 136 136 132* 136  K 4.3 4.5 4.4 4.7  CL 104 107 105 108  CO2 22 19* 19* 21*  GLUCOSE 135* 155* 185* 142*  BUN 17 24* 26* 33*  CREATININE 0.92  0.90 1.45* 1.62* 1.37*  CALCIUM 9.2 9.1 8.8* 8.8*  MG 2.2  --   --   --    GFR: Estimated Creatinine Clearance: 74.4 mL/min (A) (by C-G formula based on SCr of 1.37 mg/dL (H)). Liver Function Tests: Recent Labs  Lab 11/22/23 0752  AST 25  ALT 27  ALKPHOS 63  BILITOT 0.3  PROT 7.9  ALBUMIN 4.2   No results for input(s): "LIPASE", "AMYLASE" in the last 168 hours. No results for input(s): "AMMONIA" in the last 168 hours. Coagulation Profile: No results for input(s): "INR", "PROTIME" in the last 168 hours. Cardiac Enzymes: No results for input(s): "CKTOTAL", "CKMB", "CKMBINDEX", "TROPONINI" in the  last 168 hours. BNP (last 3 results) No results for input(s): "PROBNP" in the last 8760 hours. HbA1C: No results for input(s): "HGBA1C" in the last 72 hours. CBG: No results for input(s): "GLUCAP" in the last 168 hours. Lipid Profile: No results for input(s): "CHOL", "HDL", "LDLCALC", "TRIG", "CHOLHDL", "LDLDIRECT" in the last 72 hours. Thyroid Function Tests: No results for input(s): "TSH", "T4TOTAL", "FREET4", "T3FREE", "THYROIDAB" in the last 72 hours. Anemia Panel: No results for input(s): "VITAMINB12", "FOLATE", "FERRITIN", "TIBC", "IRON", "RETICCTPCT" in the last 72 hours. Sepsis Labs: No results for input(s): "PROCALCITON", "LATICACIDVEN" in the last 168 hours.  Recent Results (from the past 240 hour(s))  MRSA Next Gen by PCR, Nasal     Status: None   Collection Time: 11/22/23  3:22 PM   Specimen: Nasal Mucosa; Nasal Swab  Result Value Ref Range Status   MRSA by PCR Next Gen NOT DETECTED NOT DETECTED Final    Comment: (NOTE) The GeneXpert  MRSA Assay (FDA approved for NASAL specimens only), is one component of a comprehensive MRSA colonization surveillance program. It is not intended to diagnose MRSA infection nor to guide or monitor treatment for MRSA infections. Test performance is not FDA approved in patients less than 11 years old. Performed at [facility], [address] [place], [place] [address]      Radiology Studies: US Pelvis Complete  Result Date: 11/22/2023 CLINICAL DATA:  sudden rlq pain EXAM: TRANSABDOMINAL AND TRANSVAGINAL ULTRASOUND OF PELVIS DOPPLER ULTRASOUND OF OVARIES TECHNIQUE: Both transabdominal and transvaginal ultrasound examinations of the pelvis were performed. Transabdominal technique was performed for global imaging of the pelvis including uterus, ovaries, adnexal regions, and pelvic cul-de-sac. It was necessary to proceed with endovaginal exam following the transabdominal exam to visualize the bilateral ovaries and adnexa. Color and duplex Doppler ultrasound was utilized to evaluate blood flow to the ovaries. COMPARISON:  CT scan abdomen and pelvis performed earlier the same day. FINDINGS: The technologist noted limited exam due to morbid obesity and overlying bowel gas. Uterus Measurements: 4.1 x 4.7 x 8.4 cm = volume: 84.4 mL. No fibroids or other mass visualized. Endometrium Thickness: 9.0 mm.  No focal abnormality visualized. Right ovary Measurements: 2.2 x 2.7 x 4.4 cm. = volume: 13.5 mL. Normal appearance/no adnexal mass. Left ovary Measurements: 1.1 x 1.5 x 2.6 cm. = volume: 3.1 mL. Normal appearance/no adnexal mass. Pulsed Doppler evaluation of both ovaries demonstrates normal low-resistance arterial and venous waveforms. While demonstrable color flow and spectral Doppler in an ovary cannot completely exclude ovarian torsion, in combination with a normal grayscale appearance of the ovary, this makes torsion highly unlikely. Other findings No abnormal free fluid. IMPRESSION:  *Unremarkable pelvic ultrasound and Doppler exam. Electronically Signed   By: Jules Schick M.D.   On: 11/22/2023 12:38   US Transvaginal Non-OB  Result Date: 11/22/2023 CLINICAL DATA:  sudden rlq pain EXAM: TRANSABDOMINAL AND TRANSVAGINAL ULTRASOUND OF PELVIS DOPPLER ULTRASOUND OF OVARIES TECHNIQUE: Both transabdominal and transvaginal ultrasound examinations of the pelvis were performed. Transabdominal technique was performed for global imaging of the pelvis including uterus, ovaries, adnexal regions, and pelvic cul-de-sac. It was necessary to proceed with endovaginal exam following the transabdominal exam to visualize the bilateral ovaries and adnexa. Color and duplex Doppler ultrasound was utilized to evaluate blood flow to the ovaries. COMPARISON:  CT scan abdomen and pelvis performed earlier the same day. FINDINGS: The technologist noted limited exam due to morbid obesity and overlying bowel gas. Uterus Measurements: 4.1 x 4.7 x 8.4  cm = volume: 84.4 mL. No fibroids or other mass visualized. Endometrium Thickness: 9.0 mm.  No focal abnormality visualized. Right ovary Measurements: 2.2 x 2.7 x 4.4 cm. = volume: 13.5 mL. Normal appearance/no adnexal mass. Left ovary Measurements: 1.1 x 1.5 x 2.6 cm. = volume: 3.1 mL. Normal appearance/no adnexal mass. Pulsed Doppler evaluation of both ovaries demonstrates normal low-resistance arterial and venous waveforms. While demonstrable color flow and spectral Doppler in an ovary cannot completely exclude ovarian torsion, in combination with a normal grayscale appearance of the ovary, this makes torsion highly unlikely. Other findings No abnormal free fluid. IMPRESSION: *Unremarkable pelvic ultrasound and Doppler exam. Electronically Signed   By: Jules Schick M.D.   On: 11/22/2023 12:38   [place] Art/Ven Flow Abd Pelv Doppler  Result Date: 11/22/2023 CLINICAL DATA:  sudden rlq pain EXAM: TRANSABDOMINAL AND TRANSVAGINAL ULTRASOUND OF PELVIS DOPPLER ULTRASOUND OF  OVARIES TECHNIQUE: Both transabdominal and transvaginal ultrasound examinations of the pelvis were performed. Transabdominal technique was performed for global imaging of the pelvis including uterus, ovaries, adnexal regions, and pelvic cul-de-sac. It was necessary to proceed with endovaginal exam following the transabdominal exam to visualize the bilateral ovaries and adnexa. Color and duplex Doppler ultrasound was utilized to evaluate blood flow to the ovaries. COMPARISON:  CT scan abdomen and pelvis performed earlier the same day. FINDINGS: The technologist noted limited exam due to morbid obesity and overlying bowel gas. Uterus Measurements: 4.1 x 4.7 x 8.4 cm = volume: 84.4 mL. No fibroids or other mass visualized. Endometrium Thickness: 9.0 mm.  No focal abnormality visualized. Right ovary Measurements: 2.2 x 2.7 x 4.4 cm. = volume: 13.5 mL. Normal appearance/no adnexal mass. Left ovary Measurements: 1.1 x 1.5 x 2.6 cm. = volume: 3.1 mL. Normal appearance/no adnexal mass. Pulsed Doppler evaluation of both ovaries demonstrates normal low-resistance arterial and venous waveforms. While demonstrable color flow and spectral Doppler in an ovary cannot completely exclude ovarian torsion, in combination with a normal grayscale appearance of the ovary, this makes torsion highly unlikely. Other findings No abnormal free fluid. IMPRESSION: *Unremarkable pelvic ultrasound and Doppler exam. Electronically Signed   By: Jules Schick M.D.   On: 11/22/2023 12:38    Scheduled Meds:  Chlorhexidine Gluconate Cloth  6 each Topical Daily   enoxaparin (LOVENOX) injection  40 mg Subcutaneous Daily   fluticasone furoate-vilanterol  1 puff Inhalation Daily   And   umeclidinium bromide  1 puff Inhalation Daily   loratadine  10 mg Oral Daily   methylPREDNISolone (SOLU-MEDROL) injection  40 mg Intravenous BID   montelukast  10 mg Oral QHS   sodium chloride flush  3 mL Intravenous Q12H   Continuous Infusions:  sodium  chloride 100 mL/hr at 11/24/23 1015     LOS: 1 day   Hughie Closs, MD [facility]  11/24/2023, 11:03 AM   *Please note that this is a verbal dictation therefore any spelling or grammatical errors are due to the "Dragon [employer] One" system interpretation.  Please page via Amion and do not message via secure chat for urgent patient care matters. Secure chat can be used for non urgent patient care matters.  How to contact the [facility] Attending or Consulting provider 7A - 7P or covering provider during after hours 7P -7A, for this patient?  Check the care team in [facility] and look for a) attending/consulting [facility] provider listed and b) the [facility] team listed. Page or secure chat 7A-7P. Log into [email or web address] and use Archie's universal password to access.  If you do not have the password, please contact the hospital operator. Locate the [facility] provider you are looking for under [facility] and page to a number that you can be directly reached. If you still have difficulty reaching the provider, please page the [facility] (Director on Call) for the Hospitalists listed on amion for assistance.

## 2023-11-24 NOTE — Progress Notes (Signed)
Pt had just finished eating her lunch and c/o itching, face, chest, and upper back flushed. Primary RN at bedside and prn benadryl given.

## 2023-11-24 NOTE — Progress Notes (Signed)
Scheduled DPI's moved to 12 noon due to allergic reaction this morning- MD aware.

## 2023-11-24 NOTE — Progress Notes (Signed)
Unable to administer scheduled DPI's at this time. PT wishes to wait.

## 2023-11-24 NOTE — Progress Notes (Signed)
Shift Summary: Pt had an episode of anaphylactic reaction around 0845. Pt started to complained of generalized severe itching. Noted generalized redness, facial edema. Pt received benadryl, epi, and scheduled solumedrol. Symptoms improved. Will continue to monitor.

## 2023-11-24 NOTE — Progress Notes (Signed)
Pt had another episode of anaphylactic reaction. Complained of itching, chest tightness, difficulty breathing. Notable generalized redness and facial edema. PRN benandryl, epi, and albuterol administered. O2 sats 97% on room air, lungs diminished, no wheezing noted. Symptoms improving. Will continue to monitor.

## 2023-11-25 ENCOUNTER — Other Ambulatory Visit (HOSPITAL_COMMUNITY): Payer: Self-pay

## 2023-11-25 DIAGNOSIS — T782XXA Anaphylactic shock, unspecified, initial encounter: Secondary | ICD-10-CM | POA: Diagnosis not present

## 2023-11-25 LAB — CBC WITH DIFFERENTIAL/PLATELET
Abs Immature Granulocytes: 0.43 10*3/uL — ABNORMAL HIGH (ref 0.00–0.07)
Basophils Absolute: 0 10*3/uL (ref 0.0–0.1)
Basophils Relative: 0 %
Eosinophils Absolute: 0 10*3/uL (ref 0.0–0.5)
Eosinophils Relative: 0 %
HCT: 36.3 % (ref 36.0–46.0)
Hemoglobin: 11.3 g/dL — ABNORMAL LOW (ref 12.0–15.0)
Immature Granulocytes: 3 %
Lymphocytes Relative: 7 %
Lymphs Abs: 1.2 10*3/uL (ref 0.7–4.0)
MCH: 26.3 pg (ref 26.0–34.0)
MCHC: 31.1 g/dL (ref 30.0–36.0)
MCV: 84.6 fL (ref 80.0–100.0)
Monocytes Absolute: 0.6 10*3/uL (ref 0.1–1.0)
Monocytes Relative: 4 %
Neutro Abs: 14.2 10*3/uL — ABNORMAL HIGH (ref 1.7–7.7)
Neutrophils Relative %: 86 %
Platelets: 226 10*3/uL (ref 150–400)
RBC: 4.29 MIL/uL (ref 3.87–5.11)
RDW: 16.1 % — ABNORMAL HIGH (ref 11.5–15.5)
WBC: 16.5 10*3/uL — ABNORMAL HIGH (ref 4.0–10.5)
nRBC: 0 % (ref 0.0–0.2)

## 2023-11-25 LAB — BASIC METABOLIC PANEL
Anion gap: 11 (ref 5–15)
BUN: 30 mg/dL — ABNORMAL HIGH (ref 6–20)
CO2: 21 mmol/L — ABNORMAL LOW (ref 22–32)
Calcium: 9.1 mg/dL (ref 8.9–10.3)
Chloride: 107 mmol/L (ref 98–111)
Creatinine, Ser: 1.15 mg/dL — ABNORMAL HIGH (ref 0.44–1.00)
GFR, Estimated: >60 mL/min (ref >60)
Glucose, Bld: 181 mg/dL — ABNORMAL HIGH (ref 70–99)
Potassium: 4.3 mmol/L (ref 3.5–5.1)
Sodium: 139 mmol/L (ref 135–145)

## 2023-11-25 MED ORDER — OXYCODONE HCL 5 MG PO TABS
5.0000 mg | ORAL_TABLET | ORAL | Status: DC | PRN
  Administered 2023-11-26 – 2023-11-27 (×2): 5 mg via ORAL
  Filled 2023-11-25 (×2): qty 1

## 2023-11-25 MED ORDER — BUDESONIDE 0.25 MG/2ML IN SUSP
0.2500 mg | Freq: Two times a day (BID) | RESPIRATORY_TRACT | Status: DC
  Administered 2023-11-25 – 2023-11-29 (×9): 0.25 mg via RESPIRATORY_TRACT
  Filled 2023-11-25 (×9): qty 2

## 2023-11-25 MED ORDER — EPINEPHRINE 1 MG/10ML IJ SOSY
0.3000 mg | PREFILLED_SYRINGE | INTRAMUSCULAR | Status: DC | PRN
Start: 2023-11-25 — End: 2023-11-25

## 2023-11-25 MED ORDER — EPINEPHRINE 0.3 MG/0.3ML IJ SOAJ: 0.3000 mg | INTRAMUSCULAR | Status: DC | PRN

## 2023-11-25 MED ORDER — METHYLPREDNISOLONE SODIUM SUCC 40 MG IJ SOLR
40.0000 mg | Freq: Every day | INTRAMUSCULAR | Status: DC
  Administered 2023-11-26: 40 mg via INTRAVENOUS
  Filled 2023-11-25: qty 1

## 2023-11-25 MED ORDER — EPINEPHRINE PF 1 MG/ML IJ SOLN
0.3000 mg | INTRAMUSCULAR | Status: DC | PRN
  Administered 2023-11-27: 0.3 mg via INTRAMUSCULAR
  Filled 2023-11-25: qty 1

## 2023-11-25 MED ORDER — DIPHENHYDRAMINE HCL 25 MG PO CAPS
50.0000 mg | ORAL_CAPSULE | Freq: Four times a day (QID) | ORAL | Status: DC
  Administered 2023-11-25 – 2023-11-29 (×18): 50 mg via ORAL
  Filled 2023-11-25 (×18): qty 2

## 2023-11-25 MED ORDER — ALBUTEROL SULFATE (2.5 MG/3ML) 0.083% IN NEBU
3.0000 mL | INHALATION_SOLUTION | RESPIRATORY_TRACT | Status: DC | PRN
Start: 2023-11-25 — End: 2023-11-29

## 2023-11-25 MED ORDER — PAROXETINE HCL ER 12.5 MG PO TB24
12.5000 mg | ORAL_TABLET | Freq: Every day | ORAL | Status: DC
  Administered 2023-11-25 – 2023-11-27 (×3): 12.5 mg via ORAL
  Filled 2023-11-25 (×3): qty 1

## 2023-11-25 MED ORDER — ARFORMOTEROL TARTRATE 15 MCG/2ML IN NEBU
15.0000 ug | INHALATION_SOLUTION | Freq: Two times a day (BID) | RESPIRATORY_TRACT | Status: DC
  Administered 2023-11-25 – 2023-11-29 (×9): 15 ug via RESPIRATORY_TRACT
  Filled 2023-11-25 (×9): qty 2

## 2023-11-25 NOTE — Progress Notes (Signed)
Pt received epi and albuterol for complaints of itching, chest tightness, and difficulties breathing. O2 sats 100% post albuterol treatment. Will continue to monitor.

## 2023-11-25 NOTE — Progress Notes (Addendum)
Patient reports increased itching, trouble breathing, chest tightness.   Primary team informed, Primary RN called.

## 2023-11-25 NOTE — Progress Notes (Signed)
[facility] Progress Note Patient: Holly Nielsen HKV:[id number] DOB: [birth year] DOA: 11/22/2023  DOS: the patient was seen and examined on 11/25/2023  Brief hospital course: Kathaleen Lou is a 32 y.o. female with medical history significant of recurrent anaphylaxis, asthma, obesity, iron deficiency anemia initially presented to ED with complaint of right lower quadrant pain which started suddenly at 5 AM on day of presentation. No associated fever, chills, sweating, nausea, vomiting, any problem with urination or with bowel movement.  No recent travel or sick contact.   Assessment and Plan: Frequent anaphylactic reaction. Etiology is not clear. Suspect environmental allergies given patient's severely positive allergy test in the past. Did actually have periorbital edema in the ER which is resolved for now. Had another reaction on 11/27 although do not think that this was anaphylaxis. For now recommended staff to use epinephrine only if there is periorbital edema or hives. Initiating higher dose of Benadryl on a scheduled basis. Changing medications to albuterol to Brovana and Pulmicort. Has seen allergy specialist as well as ENT outpatient. On Solu-Medrol which I will reduce. Monitor.  Acute hypoxia. Currently oxygenation improving.  For now monitor.  Concern for asthma exacerbation. I do not appreciate any wheezing for now. For now we will taper off the steroids and monitor.  Right hydronephrosis with ureteral stone. Pain control for now. Treated with IV hydration. For now we will monitor.  Morbid obesity.  Class III. Placing the patient at high risk for poor outcome.   Subjective: No nausea no vomiting no fever no chills.  No chest pain.  Had another episode of shortness of breath with flushing of the face this morning.  Physical Exam: General: in severe distress, facial flushing, no rash, no angioedema. Cardiovascular: S1 and S2 Present, No Murmur Respiratory:  Increased respiratory effort, Bilateral Air entry present. No Crackles, No wheezes Abdomen: Bowel Sound present, No tenderness Extremities: Trace edema Neuro: Alert and oriented x3, no new focal deficit  Data Reviewed: I have Reviewed nursing notes, Vitals, and Lab results. Since last encounter, pertinent lab results CBC and BMP   . I have ordered test including CBC and BMP  .   Disposition: Status is: Inpatient Remains inpatient appropriate because: Monitor for 24-hour reaction free period.   Place and maintain sequential compression device Start: 11/25/23 1440 enoxaparin (LOVENOX) injection 40 mg Start: 11/22/23 1400   Family Communication: Family at bedside Level of care: Progressive   Vitals:   11/25/23 1002 11/25/23 1013 11/25/23 1136 11/25/23 1221  BP: (!) 158/87 (!) 160/100  (!) 156/100  Pulse: 89 78  82  Resp:      Temp:  97.7 F (36.5 C)  98 F (36.7 C)  TempSrc:  Oral  Oral  SpO2: 96% 100% 96% 94%  Weight:      Height:         Author: Lynden Oxford, MD 11/25/2023 7:54 PM  Please look on [email or web address] to find out who is on call.

## 2023-11-25 NOTE — Telephone Encounter (Signed)
Pharmacy Patient Advocate Encounter  Received notification from [facility] that the appeal for Trelegy Ellipta 200-62.5-25 mcg has been APPROVED from 11/21/2023 to 11/20/2024. Ran test claim, Copay is $4.00. This test claim was processed through [facility] copay amounts may vary at other pharmacies due to pharmacy/plan contracts, or as the patient moves through the different stages of their insurance plan.   PA #/Case ID/Reference #: [id number]

## 2023-11-25 NOTE — Hospital Course (Addendum)
Brief hospital course: Holly Nielsen is a 32 y.o. female with medical history significant of recurrent anaphylaxis, asthma, obesity, iron deficiency anemia initially presented to ED with complaint of right lower quadrant pain which started suddenly at 5 AM on day of presentation. No associated fever, chills, sweating, nausea, vomiting, any problem with urination or with bowel movement.  No recent travel or sick contact.   Assessment and Plan: Frequent anaphylactic reaction. Angioedema Etiology is not clear. Suspect environmental allergies given patient's severely positive allergy test in the past. Did actually have periorbital edema in the ER after receiving Toradol which is resolved for now. Had another reaction on 11/27 although do not think that this was anaphylaxis.  No further angioedema seen so far. Continue to use epinephrine only if there is periorbital edema or hives. Continue Benadryl. Suspect there is a large component of anxiety and stress related to her "allergic" reactions. Initiating Ativan.  Unable to tolerate BuSpar.  Already on Paxil.  Will increase the dose of the Paxil as well. Changing medications to albuterol to Brovana and Pulmicort. Has seen allergy specialist as well as ENT outpatient. Will reduce Solu-Medrol to prednisone. Reported allergy of Pepcid but patient is actually unable to differentiate and therefore will be initiating Pepcid.  Acute hypoxia. Currently oxygenation improving.  On room air.  Concern for asthma exacerbation. I do not appreciate any wheezing for now. Continue to taper steroids.  Right hydronephrosis with ureteral stone. Pain control for now. Treated with IV hydration. For now we will monitor.  Morbid obesity.  Class III. Body mass index is 55.58 kg/m.  Placing the patient at high risk for poor outcome.  Left-sided chest pain. Likely musculoskeletal in nature. Chest x-ray negative.  Troponin x 2 negative.  D-dimer negative.  Lower  extremity Doppler negative as well.  Anxiety. Continue as needed Ativan. Continue Benadryl. On Paxil 12.5 mg increase to 25 mg.

## 2023-11-25 NOTE — Progress Notes (Signed)
Patient with c/o SOB and itchiness. Notable facial redness and minor swelling upon assessment. No wheezing. RN administered PRN benadryl, epi, and albuterol. 02 saturations prior to albuterol administration resulted 95% room air with diminished lung sounds. Following albuterol administration 02 saturation resulted 98%. Patient stated she feels better after breathing treatment but isn't able to take a deep, fulfilling breath. No other notable changes at this time. Patient remains alert and oriented per baseline. Call bell within reach. No acute distress at this time. Will continue with current care plan.

## 2023-11-25 NOTE — TOC Initial Note (Signed)
Transition of Care [facility]) - Initial/Assessment Note    Patient Details  Name: Holly Nielsen MRN: [id number] Date of Birth: [birth year]  Transition of Care [facility]) CM/SW Contact:    Lanier Clam, RN Phone Number: 11/25/2023, 10:54 AM  Clinical Narrative: d/c plan home.Monitor for d/c needs.                 Expected Discharge Plan: Home/Self Care Barriers to Discharge: Continued Medical Work up   Patient Goals and CMS Choice Patient states their goals for this hospitalization and ongoing recovery are:: Home CMS Medicare.gov Compare Post Acute Care list provided to:: Patient   [place] ownership interest in [facility].provided to:: Patient    Expected Discharge Plan and Services   Discharge Planning Services: CM Consult   Living arrangements for the past 2 months: Single Family Home                                      Prior Living Arrangements/Services Living arrangements for the past 2 months: Single Family Home Lives with:: Spouse Patient language and need for interpreter reviewed:: Yes Do you feel safe going back to the place where you live?: Yes      Need for Family Participation in Patient Care: Yes (Comment) Care giver support system in place?: Yes (comment)   Criminal Activity/Legal Involvement Pertinent to Current Situation/Hospitalization: No - Comment as needed  Activities of Daily Living   ADL Screening (condition at time of admission) Independently performs ADLs?: Yes (appropriate for developmental age) Does the patient have a NEW difficulty with bathing/dressing/toileting/self-feeding that is expected to last >3 days?: No Does the patient have a NEW difficulty with getting in/out of bed, walking, or climbing stairs that is expected to last >3 days?: No Does the patient have a NEW difficulty with communication that is expected to last >3 days?: No Is the patient deaf or have difficulty hearing?: No Does the patient have difficulty  seeing, even when wearing glasses/contacts?: No Does the patient have difficulty concentrating, remembering, or making decisions?: No  Permission Sought/Granted Permission sought to share information with : Case Manager Permission granted to share information with : Yes, Verbal Permission Granted  Share Information with NAME: Case Manager           Emotional Assessment Appearance:: Appears stated age Attitude/Demeanor/Rapport: Gracious Affect (typically observed): Accepting Orientation: : Oriented to Self, Oriented to Place, Oriented to  Time, Oriented to Situation   Psych Involvement: No (comment)  Admission diagnosis:  Kidney stone [N20.0] Anaphylaxis [T78.2XXA] Anaphylaxis, initial encounter [T78.2XXA] Patient Active Problem List   Diagnosis Date Noted   Ureteral stone with hydronephrosis 11/23/2023   AKI (acute kidney injury) (HCC) 11/23/2023   Acute respiratory failure with hypoxia (HCC) 11/23/2023   Allergic reaction 07/31/2023   Anaphylaxis 07/30/2023   Severe persistent asthma 03/13/2022   Morbid obesity with BMI of 50.0-59.9, adult (HCC) 09/16/2021   Amenorrhea 09/16/2021   Anxiety and depression 09/16/2021   Cyst of thyroid 09/16/2021   History of iron deficiency anemia 09/16/2021   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, [address] [place] [address] Phone: [phone] Fax: [phone]  Social Determinants of Health (SDOH) Social History: SDOH Screenings   Food Insecurity: No Food Insecurity (11/22/2023)  Housing: Low Risk  (11/22/2023)  Transportation Needs: No Transportation Needs (11/22/2023)  Utilities: Not At Risk (11/22/2023)  Tobacco Use: Low Risk  (11/22/2023)    SDOH Interventions:     Readmission Risk Interventions    08/03/2023   12:27 PM  Readmission Risk Prevention Plan  Post Dischage Appt Complete  Medication Screening Complete  Transportation Screening Complete

## 2023-11-26 ENCOUNTER — Inpatient Hospital Stay (HOSPITAL_COMMUNITY): Payer: Medicaid Other

## 2023-11-26 DIAGNOSIS — J9601 Acute respiratory failure with hypoxia: Secondary | ICD-10-CM | POA: Diagnosis not present

## 2023-11-26 DIAGNOSIS — T886XXA Anaphylactic reaction due to adverse effect of correct drug or medicament properly administered, initial encounter: Secondary | ICD-10-CM | POA: Diagnosis not present

## 2023-11-26 DIAGNOSIS — E872 Acidosis, unspecified: Secondary | ICD-10-CM | POA: Diagnosis not present

## 2023-11-26 DIAGNOSIS — I517 Cardiomegaly: Secondary | ICD-10-CM | POA: Diagnosis not present

## 2023-11-26 DIAGNOSIS — Z6841 Body Mass Index (BMI) 40.0 and over, adult: Secondary | ICD-10-CM | POA: Diagnosis not present

## 2023-11-26 DIAGNOSIS — R071 Chest pain on breathing: Secondary | ICD-10-CM | POA: Diagnosis not present

## 2023-11-26 DIAGNOSIS — N132 Hydronephrosis with renal and ureteral calculous obstruction: Secondary | ICD-10-CM | POA: Diagnosis not present

## 2023-11-26 DIAGNOSIS — T398X5A Adverse effect of other nonopioid analgesics and antipyretics, not elsewhere classified, initial encounter: Secondary | ICD-10-CM | POA: Diagnosis not present

## 2023-11-26 DIAGNOSIS — N179 Acute kidney failure, unspecified: Secondary | ICD-10-CM | POA: Diagnosis not present

## 2023-11-26 DIAGNOSIS — J9811 Atelectasis: Secondary | ICD-10-CM | POA: Diagnosis not present

## 2023-11-26 DIAGNOSIS — E66813 Obesity, class 3: Secondary | ICD-10-CM | POA: Diagnosis not present

## 2023-11-26 DIAGNOSIS — E871 Hypo-osmolality and hyponatremia: Secondary | ICD-10-CM | POA: Diagnosis not present

## 2023-11-26 DIAGNOSIS — J45901 Unspecified asthma with (acute) exacerbation: Secondary | ICD-10-CM | POA: Diagnosis not present

## 2023-11-26 DIAGNOSIS — D841 Defects in the complement system: Secondary | ICD-10-CM | POA: Diagnosis not present

## 2023-11-26 DIAGNOSIS — T783XXA Angioneurotic edema, initial encounter: Secondary | ICD-10-CM | POA: Diagnosis not present

## 2023-11-26 DIAGNOSIS — T782XXA Anaphylactic shock, unspecified, initial encounter: Secondary | ICD-10-CM | POA: Diagnosis not present

## 2023-11-26 LAB — BASIC METABOLIC PANEL
Anion gap: 8 (ref 5–15)
BUN: 27 mg/dL — ABNORMAL HIGH (ref 6–20)
CO2: 27 mmol/L (ref 22–32)
Calcium: 8.6 mg/dL — ABNORMAL LOW (ref 8.9–10.3)
Chloride: 101 mmol/L (ref 98–111)
Creatinine, Ser: 0.98 mg/dL (ref 0.44–1.00)
GFR, Estimated: >60 mL/min (ref >60)
Glucose, Bld: 98 mg/dL (ref 70–99)
Potassium: 3.4 mmol/L — ABNORMAL LOW (ref 3.5–5.1)
Sodium: 136 mmol/L (ref 135–145)

## 2023-11-26 LAB — TROPONIN I (HIGH SENSITIVITY)
Troponin I (High Sensitivity): 3 ng/L (ref <18)
Troponin I (High Sensitivity): 3 ng/L (ref <18)

## 2023-11-26 LAB — MAGNESIUM: Magnesium: 2 mg/dL (ref 1.7–2.4)

## 2023-11-26 LAB — D-DIMER, QUANTITATIVE: D-Dimer, Quant: 0.29 ug{FEU}/mL (ref 0.00–0.50)

## 2023-11-26 MED ORDER — MORPHINE SULFATE (PF) 2 MG/ML IV SOLN
2.0000 mg | INTRAVENOUS | Status: DC | PRN
  Administered 2023-11-26 – 2023-11-27 (×3): 2 mg via INTRAVENOUS
  Filled 2023-11-26 (×3): qty 1

## 2023-11-26 MED ORDER — RIVAROXABAN 10 MG PO TABS
10.0000 mg | ORAL_TABLET | Freq: Every day | ORAL | Status: DC
  Administered 2023-11-26 – 2023-11-29 (×4): 10 mg via ORAL
  Filled 2023-11-26 (×4): qty 1

## 2023-11-26 NOTE — Progress Notes (Signed)
[facility] Progress Note Patient: Holly Nielsen ZOX:[id number] DOB: [birth year] DOA: 11/22/2023  DOS: the patient was seen and examined on 11/26/2023  Brief hospital course: Briget Rodrigue is a 32 y.o. female with medical history significant of recurrent anaphylaxis, asthma, obesity, iron deficiency anemia initially presented to ED with complaint of right lower quadrant pain which started suddenly at 5 AM on day of presentation. No associated fever, chills, sweating, nausea, vomiting, any problem with urination or with bowel movement.  No recent travel or sick contact.   Assessment and Plan: Frequent anaphylactic reaction. Etiology is not clear. Suspect environmental allergies given patient's severely positive allergy test in the past. Did actually have periorbital edema in the ER which is resolved for now. Had another reaction on 11/27 although do not think that this was anaphylaxis. For now recommended staff to use epinephrine only if there is periorbital edema or hives. Initiating higher dose of Benadryl on a scheduled basis. Changing medications to albuterol to Brovana and Pulmicort. Has seen allergy specialist as well as ENT outpatient. On Solu-Medrol 40 mg twice daily now on Solu-Medrol 40 mg daily. Monitor.  Acute hypoxia. Currently oxygenation improving.  For now monitor.  Concern for asthma exacerbation. I do not appreciate any wheezing for now. For now we will taper off the steroids and monitor.  Right hydronephrosis with ureteral stone. Pain control for now. Treated with IV hydration. For now we will monitor.  Morbid obesity.  Class III. Body mass index is 55.58 kg/m.  Placing the patient at high risk for poor outcome.  Left-sided chest pain. Likely musculoskeletal in nature. Chest x-ray negative.  Troponin x 2 negative.  D-dimer negative.  Lower extremity Doppler ordered.  Monitor.   Subjective: No nausea no vomiting no fever no chills.  Physical  Exam: General: in moderate distress, No Rash Cardiovascular: S1 and S2 Present, No Murmur Respiratory: Good respiratory effort, Bilateral Air entry present. No Crackles, No wheezes Abdomen: Bowel Sound present, No tenderness Extremities: Trace edema Neuro: Alert and oriented x3, no new focal deficit  Data Reviewed: I have Reviewed nursing notes, Vitals, and Lab results. Since last encounter, pertinent lab results CBC and CMP   . I have ordered test including CBC CMP troponin D-dimer. I have ordered imaging lower extremity Doppler and chest x-ray. I have independently visualized and interpreted EKG which showed EKG: normal sinus rhythm, nonspecific ST and T waves changes.  Lead reversal seen although still no evidence of ACS.  Disposition: Status is: Inpatient Remains inpatient appropriate because: Monitor overnight for improvement in pain control.  rivaroxaban (XARELTO) tablet 10 mg Start: 11/26/23 1230 Place and maintain sequential compression device Start: 11/25/23 1440 rivaroxaban (XARELTO) tablet 10 mg   Family Communication: No one at bedside Level of care: Progressive   Vitals:   11/26/23 0446 11/26/23 0449 11/26/23 0853 11/26/23 1356  BP: (!) 148/96  (!) 157/105 (!) 153/100  Pulse: 65  76 79  Resp: 18  18 18   Temp: 97.8 F (36.6 C)   98.8 F (37.1 C)  TempSrc: Oral   Oral  SpO2: 96%  99% 95%  Weight:  129.1 kg    Height:         Author: Lynden Oxford, MD 11/26/2023 7:25 PM  Please look on [email or web address] to find out who is on call.

## 2023-11-27 ENCOUNTER — Other Ambulatory Visit: Payer: Self-pay

## 2023-11-27 ENCOUNTER — Inpatient Hospital Stay (HOSPITAL_COMMUNITY): Payer: Medicaid Other

## 2023-11-27 ENCOUNTER — Other Ambulatory Visit (HOSPITAL_COMMUNITY): Payer: Self-pay

## 2023-11-27 DIAGNOSIS — R609 Edema, unspecified: Secondary | ICD-10-CM | POA: Diagnosis not present

## 2023-11-27 DIAGNOSIS — T782XXA Anaphylactic shock, unspecified, initial encounter: Secondary | ICD-10-CM | POA: Diagnosis not present

## 2023-11-27 MED ORDER — LORAZEPAM 0.5 MG PO TABS
0.5000 mg | ORAL_TABLET | ORAL | Status: DC | PRN
  Administered 2023-11-27 – 2023-11-29 (×6): 0.5 mg via ORAL
  Filled 2023-11-27 (×6): qty 1

## 2023-11-27 MED ORDER — DIPHENHYDRAMINE HCL 50 MG/ML IJ SOLN
25.0000 mg | Freq: Once | INTRAMUSCULAR | Status: AC
  Administered 2023-11-27: 25 mg via INTRAVENOUS
  Filled 2023-11-27: qty 1

## 2023-11-27 MED ORDER — OXYCODONE-ACETAMINOPHEN 5-325 MG PO TABS
1.0000 | ORAL_TABLET | Freq: Four times a day (QID) | ORAL | 0 refills | Status: DC | PRN
  Filled 2023-11-27: qty 20, 5d supply, fill #0

## 2023-11-27 MED ORDER — ALBUTEROL SULFATE (2.5 MG/3ML) 0.083% IN NEBU
3.0000 mL | INHALATION_SOLUTION | Freq: Four times a day (QID) | RESPIRATORY_TRACT | 0 refills | Status: DC | PRN
  Filled 2023-11-27 – 2024-03-25 (×4): qty 270, 23d supply, fill #0
  Filled 2024-03-25: qty 180, 15d supply, fill #0

## 2023-11-27 MED ORDER — OXYCODONE HCL 5 MG PO TABS
10.0000 mg | ORAL_TABLET | ORAL | Status: DC | PRN
  Administered 2023-11-28 – 2023-11-29 (×2): 10 mg via ORAL
  Filled 2023-11-27 (×2): qty 2

## 2023-11-27 MED ORDER — DOCUSATE SODIUM 100 MG PO CAPS
100.0000 mg | ORAL_CAPSULE | Freq: Two times a day (BID) | ORAL | Status: DC
  Administered 2023-11-27 – 2023-11-28 (×3): 100 mg via ORAL
  Filled 2023-11-27 (×3): qty 1

## 2023-11-27 MED ORDER — FAMOTIDINE 20 MG PO TABS: 20.0000 mg | ORAL_TABLET | Freq: Two times a day (BID) | ORAL | Status: DC

## 2023-11-27 MED ORDER — OXYCODONE HCL 5 MG PO TABS
5.0000 mg | ORAL_TABLET | ORAL | Status: DC | PRN
  Administered 2023-11-27 – 2023-11-29 (×5): 5 mg via ORAL
  Filled 2023-11-27 (×5): qty 1

## 2023-11-27 MED ORDER — PROCHLORPERAZINE EDISYLATE 10 MG/2ML IJ SOLN
10.0000 mg | Freq: Four times a day (QID) | INTRAMUSCULAR | Status: DC | PRN
  Administered 2023-11-29: 10 mg via INTRAVENOUS
  Filled 2023-11-27: qty 2

## 2023-11-27 MED ORDER — PAROXETINE HCL ER 12.5 MG PO TB24
12.5000 mg | ORAL_TABLET | Freq: Once | ORAL | Status: AC
  Administered 2023-11-27: 12.5 mg via ORAL
  Filled 2023-11-27: qty 1

## 2023-11-27 MED ORDER — DIPHENHYDRAMINE HCL 25 MG PO TABS
ORAL_TABLET | ORAL | 0 refills | Status: DC
  Filled 2023-11-27: qty 60, 17d supply, fill #0

## 2023-11-27 MED ORDER — DOCUSATE SODIUM 100 MG PO CAPS
100.0000 mg | ORAL_CAPSULE | Freq: Two times a day (BID) | ORAL | 0 refills | Status: DC
  Filled 2023-11-27: qty 10, 5d supply, fill #0

## 2023-11-27 MED ORDER — PAROXETINE HCL ER 25 MG PO TB24
25.0000 mg | ORAL_TABLET | Freq: Every day | ORAL | Status: DC
  Administered 2023-11-28: 25 mg via ORAL
  Filled 2023-11-27 (×3): qty 1

## 2023-11-27 NOTE — Progress Notes (Signed)
BLE venous duplex has been completed.   Results can be found under chart review under CV PROC. 11/27/2023 12:49 PM Sedale Jenifer RVT, RDMS

## 2023-11-27 NOTE — Progress Notes (Signed)
Pt called MD and RN back to the room. MD reassessed Epi given as ordered. Will reassess pt in an hour.

## 2023-11-27 NOTE — Plan of Care (Signed)
Pt with another allergic reaction d/c cancled. Cont to monitor and teach pat

## 2023-11-27 NOTE — Progress Notes (Signed)
Called to patient room she is experiencing allergic reaction. Face and arm red and hot to touch. MD notified and orders received. No resp distress noted. MD at the bedside orders carried out by this RN

## 2023-11-27 NOTE — Progress Notes (Signed)
[facility] Progress Note Patient: Holly Nielsen [id number] DOB: 12-27-1991 DOA: 11/22/2023  DOS: the patient was seen and examined on 11/27/2023  Brief hospital course: Holly Nielsen is a 32 y.o. female with medical history significant of recurrent anaphylaxis, asthma, obesity, iron deficiency anemia initially presented to ED with complaint of right lower quadrant pain which started suddenly at 5 AM on day of presentation. No associated fever, chills, sweating, nausea, vomiting, any problem with urination or with bowel movement.  No recent travel or sick contact.   Assessment and Plan: Frequent anaphylactic reaction. Angioedema Etiology is not clear. Suspect environmental allergies given patient's severely positive allergy test in the past. Did actually have periorbital edema in the ER after receiving Toradol which is resolved for now. Had another reaction on 11/27 although do not think that this was anaphylaxis.  No further angioedema seen so far. Continue to use epinephrine only if there is periorbital edema or hives. Continue Benadryl. Suspect there is a large component of anxiety and stress related to her "allergic" reactions. Initiating Ativan.  Unable to tolerate BuSpar.  Already on Paxil.  Will increase the dose of the Paxil as well. Changing medications to albuterol to Brovana and Pulmicort. Has seen allergy specialist as well as ENT outpatient. Will reduce Solu-Medrol to prednisone. Reported allergy of Pepcid but patient is actually unable to differentiate and therefore will be initiating Pepcid.  Acute hypoxia. Currently oxygenation improving.  On room air.  Concern for asthma exacerbation. I do not appreciate any wheezing for now. Continue to taper steroids.  Right hydronephrosis with ureteral stone. Pain control for now. Treated with IV hydration. For now we will monitor.  Morbid obesity.  Class III. Body mass index is 55.58 kg/m.  Placing the patient at  high risk for poor outcome.  Left-sided chest pain. Likely musculoskeletal in nature. Chest x-ray negative.  Troponin x 2 negative.  D-dimer negative.  Lower extremity Doppler negative as well.  Anxiety. Continue as needed Ativan. Continue Benadryl. On Paxil 12.5 mg increase to 25 mg.   Subjective: Reports that she had some facial flushing and redness of her upper extremities. Few minutes later significant other mentions that the arms are appearing to have some mottling. I do not appreciate stridor at the time of the event. I do not appreciate any facial swelling or angioedema at the time of the event. Patient received Benadryl without any improvement in symptoms. Patient received epinephrine with improvement in symptoms.  Physical Exam: General: in Mild distress, patchy erythema, no hives or rash No stridor.  No angioedema.  Pulses are palpable. Cardiovascular: S1 and S2 Present, No Murmur Respiratory: Good respiratory effort, Bilateral Air entry present. No Crackles, No wheezes Abdomen: Bowel Sound present, No tenderness Extremities: No edema Neuro: Alert and oriented x3, no new focal deficit, severely anxious.  Data Reviewed: I have Reviewed nursing notes, Vitals, and Lab results. Discussed with ICU. Disposition: Status is: Inpatient Remains inpatient appropriate because: Close observation to ensure no worsening reactions.  rivaroxaban (XARELTO) tablet 10 mg Start: 11/26/23 1230 Place and maintain sequential compression device Start: 11/25/23 1440 rivaroxaban (XARELTO) tablet 10 mg   Family Communication: Family at bedside Level of care: Progressive   Vitals:   11/26/23 1950 11/27/23 0518 11/27/23 0826 11/27/23 1315  BP: (!) 144/84 (!) 150/108 (!) 138/98 (!) 151/119  Pulse: 77 73 71 86  Resp: 18 18 18 18   Temp: 98.1 F (36.7 C) 98 F (36.7 C) 98.6 F (37 C) 98.1 F (36.7 C)  TempSrc: Oral  Oral Oral  SpO2: 95% 97% 96% 96%  Weight:  129 kg    Height:          Author: Lynden Oxford, MD 11/27/2023 6:50 PM  Please look on [email or web address] to find out who is on call.

## 2023-11-28 ENCOUNTER — Inpatient Hospital Stay (HOSPITAL_COMMUNITY): Payer: Medicaid Other

## 2023-11-28 ENCOUNTER — Other Ambulatory Visit (HOSPITAL_COMMUNITY): Payer: Self-pay

## 2023-11-28 DIAGNOSIS — T782XXA Anaphylactic shock, unspecified, initial encounter: Secondary | ICD-10-CM | POA: Diagnosis not present

## 2023-11-28 LAB — CBC WITH DIFFERENTIAL/PLATELET
Abs Immature Granulocytes: 0.31 10*3/uL — ABNORMAL HIGH (ref 0.00–0.07)
Basophils Absolute: 0.1 10*3/uL (ref 0.0–0.1)
Basophils Relative: 0 %
Eosinophils Absolute: 0.2 10*3/uL (ref 0.0–0.5)
Eosinophils Relative: 1 %
HCT: 40.2 % (ref 36.0–46.0)
Hemoglobin: 12.8 g/dL (ref 12.0–15.0)
Immature Granulocytes: 2 %
Lymphocytes Relative: 28 %
Lymphs Abs: 4.4 10*3/uL — ABNORMAL HIGH (ref 0.7–4.0)
MCH: 26.3 pg (ref 26.0–34.0)
MCHC: 31.8 g/dL (ref 30.0–36.0)
MCV: 82.7 fL (ref 80.0–100.0)
Monocytes Absolute: 1 10*3/uL (ref 0.1–1.0)
Monocytes Relative: 6 %
Neutro Abs: 10 10*3/uL — ABNORMAL HIGH (ref 1.7–7.7)
Neutrophils Relative %: 63 %
Platelets: 225 10*3/uL (ref 150–400)
RBC: 4.86 MIL/uL (ref 3.87–5.11)
RDW: 15.2 % (ref 11.5–15.5)
WBC: 16 10*3/uL — ABNORMAL HIGH (ref 4.0–10.5)
nRBC: 0 % (ref 0.0–0.2)

## 2023-11-28 LAB — COMPREHENSIVE METABOLIC PANEL
ALT: 29 U/L (ref 0–44)
AST: 13 U/L — ABNORMAL LOW (ref 15–41)
Albumin: 3.3 g/dL — ABNORMAL LOW (ref 3.5–5.0)
Alkaline Phosphatase: 53 U/L (ref 38–126)
Anion gap: 7 (ref 5–15)
BUN: 24 mg/dL — ABNORMAL HIGH (ref 6–20)
CO2: 28 mmol/L (ref 22–32)
Calcium: 8.6 mg/dL — ABNORMAL LOW (ref 8.9–10.3)
Chloride: 99 mmol/L (ref 98–111)
Creatinine, Ser: 0.94 mg/dL (ref 0.44–1.00)
GFR, Estimated: >60 mL/min (ref >60)
Glucose, Bld: 85 mg/dL (ref 70–99)
Potassium: 3.9 mmol/L (ref 3.5–5.1)
Sodium: 134 mmol/L — ABNORMAL LOW (ref 135–145)
Total Bilirubin: 0.6 mg/dL (ref <1.2)
Total Protein: 6.4 g/dL — ABNORMAL LOW (ref 6.5–8.1)

## 2023-11-28 MED ORDER — PAROXETINE HCL ER 12.5 MG PO TB24
25.0000 mg | ORAL_TABLET | Freq: Every day | ORAL | Status: DC
  Administered 2023-11-29: 25 mg via ORAL
  Filled 2023-11-28: qty 2

## 2023-11-28 NOTE — Progress Notes (Signed)
[facility] Progress Note Patient: Holly Nielsen [id number]:[id number] DOB: 18-Feb-1991 DOA: 11/22/2023  DOS: the patient was seen and examined on 11/28/2023  Brief hospital course: Alizon Koellner is a 32 y.o. female with medical history significant of recurrent anaphylaxis, asthma, obesity, iron deficiency anemia initially presented to ED with complaint of right lower quadrant pain which started suddenly at 5 AM on day of presentation. No associated fever, chills, sweating, nausea, vomiting, any problem with urination or with bowel movement.  No recent travel or sick contact.   Assessment and Plan: Frequent anaphylactic reaction. Angioedema after receiving Toradol. Etiology is not clear. Suspect environmental allergies given patient's severely positive allergy test in the past.  Versus possibility of a muscle induced angioedema versus hereditary angioedema with normal C1 esterase level. Angioedema seen in the ED after receiving Toradol. Had reaction on 11/27, 11/29. Now then had angioedema. Do not think that this was anaphylactic reaction. Continue to use epinephrine only if there is periorbital edema or hives. Continue Benadryl. Suspect there is a large component of anxiety and stress related to her "allergic" reactions. Initiating Ativan.  Unable to tolerate BuSpar.  Already on Paxil.  Will increase the dose of the Paxil as well. Changing medications to albuterol to Brovana and Pulmicort. Has seen allergy specialist as well as ENT outpatient. Continue prednisone. Reported allergy of Pepcid but patient is actually unable to differentiate and therefore will be initiating Pepcid.  Now tolerating Pepcid without any reaction.  Will remove it from her allergy list.  Acute hypoxia. Currently oxygenation improving.  On room air.  Concern for asthma exacerbation. I do not appreciate any wheezing for now. Continue to taper steroids.  Continue nebulizer therapy for now.  Right hydronephrosis  with ureteral stone. Pain control for now.  Repeat ultrasound renal on 11/30 shows no hydronephrosis.  Also no stones seen. Treated with IV hydration. For now we will monitor.  Morbid obesity.  Class III. Body mass index is 55.58 kg/m.  Placing the patient at high risk for poor outcome.  Had a sleep study which was negative.  Left-sided chest pain. Likely musculoskeletal in nature. Chest x-ray negative.  Troponin x 2 negative.  D-dimer negative.  Lower extremity Doppler negative as well.  Anxiety. Continue as needed Ativan. Continue Benadryl. On Paxil 12.5 mg increase to 25 mg.   Subjective: No nausea no vomiting no fever no chills still with chest pain.  Still with abdominal pain.  Left arm still has some redness.  No angioedema appreciated.  Reports fatigue and tired.  Physical Exam: General: in Mild distress, No Rash, patchy erythematous areas on bilateral lower extremities.  No angioedema.  No tongue swelling.  No stridor. Cardiovascular: S1 and S2 Present, No Murmur Respiratory: Good respiratory effort, Bilateral Air entry present. No Crackles, No wheezes Abdomen: Bowel Sound present, No tenderness Extremities: No edema Neuro: Alert and oriented x3, no new focal deficit  Data Reviewed: I have Reviewed nursing notes, Vitals, and Lab results. Since last encounter, pertinent lab results CBC and BMP   . I have ordered test including CBC and BMP  .   Disposition: Status is: Inpatient Remains inpatient appropriate because: Monitor for improvement in symptoms  rivaroxaban (XARELTO) tablet 10 mg Start: 11/26/23 1230 Place and maintain sequential compression device Start: 11/25/23 1440 rivaroxaban (XARELTO) tablet 10 mg   Family Communication: Discussed with mother at bedside. Level of care: Progressive   Vitals:   11/27/23 2037 11/28/23 0404 11/28/23 0750 11/28/23 1244  BP: (!) 150/95 (!) 141/98  Marland Kitchen)  149/108  Pulse: 85 81  81  Resp: 16 18  19   Temp: 98 F (36.7 C) 98.4 F  (36.9 C)  98.6 F (37 C)  TempSrc: Oral Oral  Oral  SpO2: 97% 97% 93% 97%  Weight:      Height:         Author: Lynden Oxford, MD 11/28/2023 6:52 PM  Please look on [email or web address] to find out who is on call.

## 2023-11-28 NOTE — Plan of Care (Signed)

## 2023-11-28 NOTE — Progress Notes (Signed)
Mobility Specialist - Progress Note   11/28/23 0853  Mobility  Activity Ambulated with assistance in room  Level of Assistance Standby assist, set-up cues, supervision of patient - no hands on  Assistive Device None  Distance Ambulated (ft) 10 ft  Range of Motion/Exercises Active  Activity Response Tolerated well  Mobility Referral Yes  $Mobility charge 1 Mobility  Mobility Specialist Start Time (ACUTE ONLY) 0848  Mobility Specialist Stop Time (ACUTE ONLY) 0853  Mobility Specialist Time Calculation (min) (ACUTE ONLY) 5 min   Pt was found in bathroom and went back to bed with supervision. Pt was left in bed with all needs met. Call bell in reach.  Billey Chang Mobility Specialist

## 2023-11-28 NOTE — Plan of Care (Signed)
Pt progressing cont with of care

## 2023-11-29 DIAGNOSIS — T782XXA Anaphylactic shock, unspecified, initial encounter: Secondary | ICD-10-CM | POA: Diagnosis not present

## 2023-11-29 MED ORDER — PREDNISONE 5 MG PO TABS
30.0000 mg | ORAL_TABLET | Freq: Every day | ORAL | Status: DC
  Administered 2023-11-29: 30 mg via ORAL
  Filled 2023-11-29: qty 2

## 2023-11-29 MED ORDER — CETIRIZINE HCL 10 MG PO TABS: 10.0000 mg | ORAL_TABLET | Freq: Two times a day (BID) | ORAL | 0 refills | Status: DC

## 2023-11-29 MED ORDER — OXYCODONE-ACETAMINOPHEN 5-325 MG PO TABS: 1.0000 | ORAL_TABLET | Freq: Four times a day (QID) | ORAL | 0 refills | Status: DC | PRN

## 2023-11-29 MED ORDER — PROCHLORPERAZINE MALEATE 5 MG PO TABS: 5.0000 mg | ORAL_TABLET | Freq: Three times a day (TID) | ORAL | 0 refills | Status: DC | PRN

## 2023-11-29 MED ORDER — PAROXETINE HCL ER 25 MG PO TB24: 25.0000 mg | ORAL_TABLET | Freq: Every morning | ORAL | 0 refills | Status: AC

## 2023-11-29 MED ORDER — MONTELUKAST SODIUM 10 MG PO TABS: 10.0000 mg | ORAL_TABLET | Freq: Every day | ORAL | 0 refills | Status: DC

## 2023-11-29 MED ORDER — LORAZEPAM 0.5 MG PO TABS: 0.5000 mg | ORAL_TABLET | Freq: Three times a day (TID) | ORAL | 0 refills | Status: DC | PRN

## 2023-11-29 MED ORDER — PREDNISONE 10 MG PO TABS: ORAL_TABLET | ORAL | 0 refills | Status: DC

## 2023-11-29 MED ORDER — GUAIFENESIN-DM 100-10 MG/5ML PO SYRP: 5.0000 mL | ORAL_SOLUTION | ORAL | Status: DC | PRN

## 2023-11-29 MED ORDER — DOCUSATE SODIUM 100 MG PO CAPS: 100.0000 mg | ORAL_CAPSULE | Freq: Two times a day (BID) | ORAL | 0 refills | Status: DC | PRN

## 2023-11-29 MED ORDER — DIPHENHYDRAMINE HCL 50 MG PO CAPS: 50.0000 mg | ORAL_CAPSULE | Freq: Four times a day (QID) | ORAL | 0 refills | Status: DC | PRN

## 2023-11-29 NOTE — TOC Transition Note (Signed)
Transition of Care [facility]) - CM/SW Discharge Note   Patient Details  Name: Holly Nielsen MRN: [id number] Date of Birth: 07-Aug-1991  Transition of Care [facility]) CM/SW Contact:  Adrian Prows, RN Phone Number: 11/29/2023, 3:14 PM   Clinical Narrative:    D/C orders received; no TOC needs.   Final next level of care: Home/Self Care Barriers to Discharge: No Barriers Identified   Patient Goals and CMS Choice CMS Medicare.gov Compare Post Acute Care list provided to:: Patient    Discharge Placement                         Discharge Plan and Services Additional resources added to the After Visit Summary for     Discharge Planning Services: CM Consult                                 Social Determinants of Health (SDOH) Interventions SDOH Screenings   Food Insecurity: No Food Insecurity (11/22/2023)  Housing: Low Risk  (11/22/2023)  Transportation Needs: No Transportation Needs (11/22/2023)  Utilities: Not At Risk (11/22/2023)  Tobacco Use: Low Risk  (11/22/2023)     Readmission Risk Interventions    08/03/2023   12:27 PM  Readmission Risk Prevention Plan  Post Dischage Appt Complete  Medication Screening Complete  Transportation Screening Complete

## 2023-11-29 NOTE — Discharge Summary (Signed)
Physician Discharge Summary   Patient: Holly Nielsen MRN: [id number] DOB: [birth year]  Admit date:     11/22/2023  Discharge date: 11/29/23  Discharge Physician: Lynden Oxford  PCP: Pcp, No  Recommendations at discharge: Follow-up with PCP discussed blood pressure medication. Follow-up with allergy specialist   Follow-up Information     PCP. Schedule an appointment as soon as possible for a visit in 1 week(s).   Why: To discuss blood pressure and need for meds        Allergy specialist. Schedule an appointment as soon as possible for a visit in 2 week(s).   Why: for further work up. if you can get to see a new provider soon enough as you are planning, would recommend to follow up with prior specialist to ensrue that you have timely follow up.        [place], [place]. Call in 1 week(s).   Why: to ensure Appointment is Scheduled Contact information: [address] [place] [address] [phone]                Discharge Diagnoses: Principal Problem:   Anaphylaxis Active Problems:   Morbid obesity with BMI of 50.0-59.9, adult (HCC)   Acute asthma exacerbation   Ureteral stone with hydronephrosis   AKI (acute kidney injury) (HCC)   Acute respiratory failure with hypoxia [facility])  Brief hospital course: Holly Nielsen is a 32 y.o. female with medical history significant of recurrent anaphylaxis, asthma, obesity, iron deficiency anemia initially presented to ED with complaint of right lower quadrant pain which started suddenly at 5 AM on day of presentation. No associated fever, chills, sweating, nausea, vomiting, any problem with urination or with bowel movement.  No recent travel or sick contact.   Assessment and Plan: Frequent anaphylactic reaction. Angioedema after receiving Toradol. Etiology is not clear. Suspect environmental allergies given patient's severely positive allergy test in the past.  Versus possibility of a mast cell induced angioedema  versus hereditary angioedema with normal C1 esterase level. True angioedema seen in the ED after receiving Toradol. Had reaction on 11/27, 11/29.  Without any angioedema. Do not think that this was anaphylactic reaction as blood pressure was actually stable. Discussed with on-call allergy specialist at [facility].  Close follow-up requested.  Appreciate assistance.  Will increase Zyrtec to twice daily. Continue Benadryl as needed. Suspect there is a large component of anxiety and stress related to her "allergic" reactions. Continue Ativan as needed. Already on Paxil.  Will increase the dose to 25 mg. Has seen ENT outpatient.  Reported to have no vocal cord dysfunction. Continue prednisone taper in next few days. Reported allergy of Pepcid but patient is actually unable to differentiate and therefore, unfortunately did not receive Pepcid in the hospital.  Would recommend to attempt again if hospitalized in future.  Acute hypoxia. Currently oxygenation improving.  On room air.  Concern for asthma exacerbation. I do not appreciate any wheezing for now. Continue to taper steroids.  Continue inhalers.  Right hydronephrosis with ureteral stone. Pain control for now.  Repeat ultrasound renal on 11/30 shows no hydronephrosis.  Also no stones seen. Treated with IV hydration. For now we will monitor.  Morbid obesity.  Class III. Body mass index is 55.58 kg/m.  Placing the patient at high risk for poor outcome.  Had a sleep study which was negative.  Left-sided chest pain. Likely musculoskeletal in nature. Chest x-ray negative.  Troponin x 2 negative.  D-dimer negative.  Lower extremity Doppler negative as  well.  Anxiety. Continue as needed Ativan. Continue Benadryl. On Paxil 12.5 mg increase to 25 mg.  Pain control - [employer] Controlled Substance Reporting System database was reviewed. and patient was instructed, not to drive, operate heavy machinery, perform activities at heights,  swimming or participation in water activities or provide baby-sitting services while on Pain, Sleep and Anxiety Medications; until their outpatient Physician has advised to do so again. Also recommended to not to take more than prescribed Pain, Sleep and Anxiety Medications.  Consultants:  None  Procedures performed:  None  DISCHARGE MEDICATION: Allergies as of 11/29/2023       Reactions   Capsicum Anaphylaxis   Nsaids Swelling   Penicillins Anaphylaxis   Buspirone Diarrhea   Other Nausea And Vomiting, Diarrhea   Green bell peppers   Celery Oil Diarrhea   Iron Sucrose Other (See Comments)    Dizziness, Pt became flushed, hot and diaphoretic after completion of infusion.    Nutricap [actical] Diarrhea   Pedi-pre Tape Spray [wound Dressing Adhesive] Hives   Wound Dressings Hives   Anise Extract [flavoring Agent] Hives, Rash   Vanilla Extract        Medication List     STOP taking these medications    diphenhydrAMINE 25 mg capsule Commonly known as: BENADRYL Replaced by: diphenhydrAMINE 25 MG tablet   famotidine 20 MG tablet Commonly known as: PEPCID   naproxen 375 MG tablet Commonly known as: NAPROSYN       TAKE these medications    albuterol 108 (90 Base) MCG/ACT inhaler Commonly known as: VENTOLIN HFA Inhale 2 puffs into the lungs every 6 hours as needed for wheezing.   albuterol (2.5 MG/3ML) 0.083% nebulizer solution Commonly known as: PROVENTIL Inhale 1 vial by nebulization every 6 (six) hours as needed for wheezing or shortness of breath.   cetirizine 10 MG tablet Commonly known as: ZYRTEC Take 1 tablet (10 mg total) by mouth 2 (two) times daily. What changed: when to take this   diphenhydrAMINE 25 MG tablet Commonly known as: BENADRYL Take 2 tablets (50 mg total) by mouth 4 (four) times daily for 1 day, THEN 2 tablets (50 mg total) in the morning, at noon, and at bedtime for 1 day, THEN 2 tablets (50 mg total) every 8 (eight) hours as needed Start  taking on: November 27, 2023 Replaces: diphenhydrAMINE 25 mg capsule   diphenhydrAMINE 50 MG capsule Commonly known as: BENADRYL Take 1 capsule (50 mg total) by mouth every 6 (six) hours as needed for itching or allergies.   docusate sodium 100 MG capsule Commonly known as: COLACE Take 1 capsule (100 mg total) by mouth 2 (two) times daily as needed for mild constipation.   EPINEPHrine 0.3 mg/0.3 mL Soaj injection Commonly known as: EPI-PEN Inject 0.3 mg into the muscle as needed for anaphylaxis.   LORazepam 0.5 MG tablet Commonly known as: ATIVAN Take 1 tablet (0.5 mg total) by mouth every 8 (eight) hours as needed for anxiety.   montelukast 10 MG tablet Commonly known as: SINGULAIR Take 1 tablet (10 mg total) by mouth at bedtime.   omalizumab 300 MG/2ML injection Commonly known as: XOLAIR Inject 300 mg into the skin every 14 (fourteen) days.   oxyCODONE-acetaminophen 5-325 MG tablet Commonly known as: Percocet Take 1 tablet by mouth every 6 (six) hours as needed for severe pain (pain score 7-10) or moderate pain (pain score 4-6).   PARoxetine 25 MG 24 hr tablet Commonly known as: PAXIL-CR Take 1 tablet (  25 mg total) by mouth every morning. What changed:  medication strength how much to take   predniSONE 10 MG tablet Commonly known as: DELTASONE Take 30mg  daily for 3days,Take 20mg  daily for 3days,Take 10mg  daily for 3days, then stop Start taking on: November 30, 2023   prochlorperazine 5 MG tablet Commonly known as: COMPAZINE Take 1 tablet (5 mg total) by mouth every 8 (eight) hours as needed for nausea or vomiting.   Trelegy Ellipta 200-62.5-25 MCG/ACT Aepb Generic drug: Fluticasone-Umeclidin-Vilant Inhale 1 Puff into the lungs once daily . Rinse mouth after use       Disposition: Home Diet recommendation: Regular diet  Discharge Exam: Vitals:   11/29/23 0500 11/29/23 0503 11/29/23 0736 11/29/23 1343  BP:  (!) 145/100  125/67  Pulse:  86  87  Resp:  17   18  Temp:  97.6 F (36.4 C)  97.7 F (36.5 C)  TempSrc:  Oral  Oral  SpO2:  97% 91% 94%  Weight: 127.4 kg     Height:       General: Appear in mild distress; no visible Abnormal Neck Mass Or lumps, Conjunctiva normal, facial flushing, bilateral upper extremity redness seen unchanged from yesterday. Cardiovascular: S1 and S2 Present, no Murmur, Respiratory: good respiratory effort, Bilateral Air entry present and CTA, no Crackles, no wheezes Abdomen: Bowel Sound present, mild diffuse right-sided tenderness. Extremities: no Pedal edema Neurology: alert and oriented to time, place, and person  Filed Weights   11/26/23 0449 11/27/23 0518 11/29/23 0500  Weight: 129.1 kg 129 kg 127.4 kg   Condition at discharge: stable  The results of significant diagnostics from this hospitalization (including imaging, microbiology, ancillary and laboratory) are listed below for reference.   Imaging Studies: US RENAL  Result Date: 11/28/2023 CLINICAL DATA:  Hydronephrosis of right kidney EXAM: RENAL / URINARY TRACT ULTRASOUND COMPLETE COMPARISON:  Noncontrast CT 11/22/2023. FINDINGS: Right Kidney: Renal measurements: 8.7 x 4.3 x 4.7 cm = volume: 91.1 mL. Echogenicity within normal limits. No mass or hydronephrosis visualized. Left Kidney: Renal measurements: 9.2 x 5.2 x 5.8 cm = volume: 144.3 mL. Echogenicity within normal limits. No mass or hydronephrosis visualized. Bladder: Appears normal for degree of bladder distention. Other: Ureteral jets are seen. IMPRESSION: No collecting system dilatation. Ureteral jets are seen. The stone by CT scan is not appreciated on this ultrasound today Electronically Signed   By: Karen Kays M.D.   On: 11/28/2023 13:45   VAS [place] LOWER EXTREMITY VENOUS (DVT)  Result Date: 11/27/2023  Lower Venous DVT Study Patient Name:  HAVYNN CASTERLINE  Date of Exam:   11/27/2023 Medical Rec #: [id number]       Accession #:    [id number] Date of Birth: May 12, 1991       Patient Gender: F  Patient Age:   49 years Exam Location:  [facility] Procedure:      VAS [place] LOWER EXTREMITY VENOUS (DVT) Referring Phys: Brodie Correll --------------------------------------------------------------------------------  Indications: Edema.  Risk Factors: Obesity. Limitations: Body habitus and poor ultrasound/tissue interface. Comparison Study: No previous results available for review. Performing Technologist: Ernestene Mention RVT, RDMS  Examination Guidelines: A complete evaluation includes B-mode imaging, spectral Doppler, color Doppler, and power Doppler as needed of all accessible portions of each vessel. Bilateral testing is considered an integral part of a complete examination. Limited examinations for reoccurring indications may be performed as noted. The reflux portion of the exam is performed with the patient in reverse Trendelenburg.  +---------+---------------+---------+-----------+----------+-------------------+ RIGHT  CompressibilityPhasicitySpontaneityPropertiesThrombus Aging      +---------+---------------+---------+-----------+----------+-------------------+ CFV      Full           Yes      Yes                                      +---------+---------------+---------+-----------+----------+-------------------+ SFJ      Full                                                             +---------+---------------+---------+-----------+----------+-------------------+ FV Prox  Full           Yes      Yes                                      +---------+---------------+---------+-----------+----------+-------------------+ FV Mid   Full           Yes      Yes                                      +---------+---------------+---------+-----------+----------+-------------------+ FV DistalFull           Yes      Yes                                      +---------+---------------+---------+-----------+----------+-------------------+ PFV      Full                                                              +---------+---------------+---------+-----------+----------+-------------------+ POP      Full           Yes      Yes                                      +---------+---------------+---------+-----------+----------+-------------------+ PTV      Full                                         Not well visualized +---------+---------------+---------+-----------+----------+-------------------+ PERO     Full                                         Not well visualized +---------+---------------+---------+-----------+----------+-------------------+   +---------+---------------+---------+-----------+----------+-------------------+ LEFT     CompressibilityPhasicitySpontaneityPropertiesThrombus Aging      +---------+---------------+---------+-----------+----------+-------------------+ CFV      Full           Yes      Yes                                      +---------+---------------+---------+-----------+----------+-------------------+  SFJ      Full                                                             +---------+---------------+---------+-----------+----------+-------------------+ FV Prox  Full           Yes      Yes                                      +---------+---------------+---------+-----------+----------+-------------------+ FV Mid   Full           Yes      Yes                                      +---------+---------------+---------+-----------+----------+-------------------+ FV DistalFull           Yes      Yes                                      +---------+---------------+---------+-----------+----------+-------------------+ PFV      Full                                                             +---------+---------------+---------+-----------+----------+-------------------+ POP      Full           Yes      Yes                                       +---------+---------------+---------+-----------+----------+-------------------+ PTV      Full                                                             +---------+---------------+---------+-----------+----------+-------------------+ PERO     Full                                         Not well visualized +---------+---------------+---------+-----------+----------+-------------------+     Summary: BILATERAL: - No evidence of deep vein thrombosis seen in the lower extremities, bilaterally. -No evidence of popliteal cyst, bilaterally.   *See table(s) above for measurements and observations. Electronically signed by Sherald Hess MD on 11/27/2023 at 12:59:20 PM.    Final    DG CHEST PORT 1 VIEW  Result Date: 11/26/2023 CLINICAL DATA:  Chest pain shortness of breath EXAM: PORTABLE CHEST 1 VIEW COMPARISON:  10/12/2023 FINDINGS: Mild cardiomegaly. Streaky basilar atelectasis. No consolidation or pleural effusion. No pneumothorax IMPRESSION: Cardiomegaly with mild bibasilar atelectasis Electronically Signed   By: Adrian Prows.D.  On: 11/26/2023 15:04   US Pelvis Complete  Result Date: 11/22/2023 CLINICAL DATA:  sudden rlq pain EXAM: TRANSABDOMINAL AND TRANSVAGINAL ULTRASOUND OF PELVIS DOPPLER ULTRASOUND OF OVARIES TECHNIQUE: Both transabdominal and transvaginal ultrasound examinations of the pelvis were performed. Transabdominal technique was performed for global imaging of the pelvis including uterus, ovaries, adnexal regions, and pelvic cul-de-sac. It was necessary to proceed with endovaginal exam following the transabdominal exam to visualize the bilateral ovaries and adnexa. Color and duplex Doppler ultrasound was utilized to evaluate blood flow to the ovaries. COMPARISON:  CT scan abdomen and pelvis performed earlier the same day. FINDINGS: The technologist noted limited exam due to morbid obesity and overlying bowel gas. Uterus Measurements: 4.1 x 4.7 x 8.4 cm = volume: 84.4 mL. No  fibroids or other mass visualized. Endometrium Thickness: 9.0 mm.  No focal abnormality visualized. Right ovary Measurements: 2.2 x 2.7 x 4.4 cm. = volume: 13.5 mL. Normal appearance/no adnexal mass. Left ovary Measurements: 1.1 x 1.5 x 2.6 cm. = volume: 3.1 mL. Normal appearance/no adnexal mass. Pulsed Doppler evaluation of both ovaries demonstrates normal low-resistance arterial and venous waveforms. While demonstrable color flow and spectral Doppler in an ovary cannot completely exclude ovarian torsion, in combination with a normal grayscale appearance of the ovary, this makes torsion highly unlikely. Other findings No abnormal free fluid. IMPRESSION: *Unremarkable pelvic ultrasound and Doppler exam. Electronically Signed   By: Jules Schick M.D.   On: 11/22/2023 12:38   US Transvaginal Non-OB  Result Date: 11/22/2023 CLINICAL DATA:  sudden rlq pain EXAM: TRANSABDOMINAL AND TRANSVAGINAL ULTRASOUND OF PELVIS DOPPLER ULTRASOUND OF OVARIES TECHNIQUE: Both transabdominal and transvaginal ultrasound examinations of the pelvis were performed. Transabdominal technique was performed for global imaging of the pelvis including uterus, ovaries, adnexal regions, and pelvic cul-de-sac. It was necessary to proceed with endovaginal exam following the transabdominal exam to visualize the bilateral ovaries and adnexa. Color and duplex Doppler ultrasound was utilized to evaluate blood flow to the ovaries. COMPARISON:  CT scan abdomen and pelvis performed earlier the same day. FINDINGS: The technologist noted limited exam due to morbid obesity and overlying bowel gas. Uterus Measurements: 4.1 x 4.7 x 8.4 cm = volume: 84.4 mL. No fibroids or other mass visualized. Endometrium Thickness: 9.0 mm.  No focal abnormality visualized. Right ovary Measurements: 2.2 x 2.7 x 4.4 cm. = volume: 13.5 mL. Normal appearance/no adnexal mass. Left ovary Measurements: 1.1 x 1.5 x 2.6 cm. = volume: 3.1 mL. Normal appearance/no adnexal mass.  Pulsed Doppler evaluation of both ovaries demonstrates normal low-resistance arterial and venous waveforms. While demonstrable color flow and spectral Doppler in an ovary cannot completely exclude ovarian torsion, in combination with a normal grayscale appearance of the ovary, this makes torsion highly unlikely. Other findings No abnormal free fluid. IMPRESSION: *Unremarkable pelvic ultrasound and Doppler exam. Electronically Signed   By: Jules Schick M.D.   On: 11/22/2023 12:38   [place] Art/Ven Flow Abd Pelv Doppler  Result Date: 11/22/2023 CLINICAL DATA:  sudden rlq pain EXAM: TRANSABDOMINAL AND TRANSVAGINAL ULTRASOUND OF PELVIS DOPPLER ULTRASOUND OF OVARIES TECHNIQUE: Both transabdominal and transvaginal ultrasound examinations of the pelvis were performed. Transabdominal technique was performed for global imaging of the pelvis including uterus, ovaries, adnexal regions, and pelvic cul-de-sac. It was necessary to proceed with endovaginal exam following the transabdominal exam to visualize the bilateral ovaries and adnexa. Color and duplex Doppler ultrasound was utilized to evaluate blood flow to the ovaries. COMPARISON:  CT scan abdomen and pelvis performed earlier the same  day. FINDINGS: The technologist noted limited exam due to morbid obesity and overlying bowel gas. Uterus Measurements: 4.1 x 4.7 x 8.4 cm = volume: 84.4 mL. No fibroids or other mass visualized. Endometrium Thickness: 9.0 mm.  No focal abnormality visualized. Right ovary Measurements: 2.2 x 2.7 x 4.4 cm. = volume: 13.5 mL. Normal appearance/no adnexal mass. Left ovary Measurements: 1.1 x 1.5 x 2.6 cm. = volume: 3.1 mL. Normal appearance/no adnexal mass. Pulsed Doppler evaluation of both ovaries demonstrates normal low-resistance arterial and venous waveforms. While demonstrable color flow and spectral Doppler in an ovary cannot completely exclude ovarian torsion, in combination with a normal grayscale appearance of the ovary, this makes  torsion highly unlikely. Other findings No abnormal free fluid. IMPRESSION: *Unremarkable pelvic ultrasound and Doppler exam. Electronically Signed   By: Jules Schick M.D.   On: 11/22/2023 12:38   CT Renal Stone Study  Result Date: 11/22/2023 CLINICAL DATA:  Abdominal/flank pain, stone suspected EXAM: CT ABDOMEN AND PELVIS WITHOUT CONTRAST TECHNIQUE: Multidetector CT imaging of the abdomen and pelvis was performed following the standard protocol without IV contrast. RADIATION DOSE REDUCTION: This exam was performed according to the departmental dose-optimization program which includes automated exposure control, adjustment of the mA and/or kV according to patient size and/or use of iterative reconstruction technique. COMPARISON:  None Available. FINDINGS: Evaluation is limited by lack of IV contrast. Lower chest: No acute abnormality. Hepatobiliary: Status post cholecystectomy. Unremarkable noncontrast appearance of the liver. Pancreas: No peripancreatic fat stranding. Spleen: Unremarkable. Adrenals/Urinary Tract: Adrenal glands are unremarkable. There is a 4 mm ureterolithiasis in the proximal RIGHT ureter with mild upstream hydroureteronephrosis. There are several additional punctate nonobstructing bilateral nephrolithiasis. No LEFT-sided hydronephrosis. Bladder is decompressed and unremarkable. Stomach/Bowel: No evidence of bowel obstruction. Status post appendectomy. Stomach is unremarkable. Vascular/Lymphatic: No significant vascular findings are present. No enlarged abdominal or pelvic lymph nodes. Reproductive: Uterus and bilateral adnexa are unremarkable. Other: No free air or free fluid. Musculoskeletal: Bilateral assimilation joints at the lumbosacral junction. No acute osseous abnormality. IMPRESSION: 1. There is a 4 mm ureterolithiasis in the proximal RIGHT ureter with mild upstream hydroureteronephrosis. 2. There are several additional punctate nonobstructing bilateral nephrolithiasis.  Electronically Signed   By: Meda Klinefelter M.D.   On: 11/22/2023 09:10    Microbiology: Results for orders placed or performed during the hospital encounter of 11/22/23  MRSA Next Gen by PCR, Nasal     Status: None   Collection Time: 11/22/23  3:22 PM   Specimen: Nasal Mucosa; Nasal Swab  Result Value Ref Range Status   MRSA by PCR Next Gen NOT DETECTED NOT DETECTED Final    Comment: (NOTE) The GeneXpert MRSA Assay (FDA approved for NASAL specimens only), is one component of a comprehensive MRSA colonization surveillance program. It is not intended to diagnose MRSA infection nor to guide or monitor treatment for MRSA infections. Test performance is not FDA approved in patients less than 31 years old. Performed at [facility], [address] [place], [place] [address]    Labs: CBC: Recent Labs  Lab 11/23/23 0320 11/24/23 0455 11/25/23 0507 11/28/23 1057  WBC 15.5* 16.1* 16.5* 16.0*  NEUTROABS  --  14.4* 14.2* 10.0*  HGB 12.2 11.5* 11.3* 12.8  HCT 39.4 35.8* 36.3 40.2  MCV 84.2 83.4 84.6 82.7  PLT 233 219 226 225   Basic Metabolic Panel: Recent Labs  Lab 11/23/23 1305 11/24/23 0455 11/25/23 0507 11/26/23 0818 11/28/23 1057  NA 132* 136 139 136 134*  K 4.4 4.7 4.3 3.4* 3.9  CL 105 108 107 101 99  CO2 19* 21* 21* 27 28  GLUCOSE 185* 142* 181* 98 85  BUN 26* 33* 30* 27* 24*  CREATININE 1.62* 1.37* 1.15* 0.98 0.94  CALCIUM 8.8* 8.8* 9.1 8.6* 8.6*  MG  --   --   --  2.0  --    Liver Function Tests: Recent Labs  Lab 11/28/23 1057  AST 13*  ALT 29  ALKPHOS 53  BILITOT 0.6  PROT 6.4*  ALBUMIN 3.3*   CBG: No results for input(s): "GLUCAP" in the last 168 hours.  Discharge time spent: greater than 30 minutes.  Author: Lynden Oxford, MD  [facility] 11/29/2023

## 2023-11-30 ENCOUNTER — Telehealth (HOSPITAL_COMMUNITY): Payer: Self-pay | Admitting: Pharmacy Technician

## 2023-11-30 ENCOUNTER — Other Ambulatory Visit (HOSPITAL_COMMUNITY): Payer: Self-pay

## 2023-11-30 LAB — C1 ESTERASE INHIBITOR: C1INH SerPl-mCnc: 39 mg/dL (ref 21–39)

## 2023-11-30 LAB — TRYPTASE: Tryptase: 6 ug/L (ref 2.2–13.2)

## 2023-11-30 NOTE — Telephone Encounter (Signed)
Pharmacy Patient Advocate Encounter   Received notification from Fax that prior authorization for PARoxetine HCl ER 25MG  er tablets is required/requested.   Insurance verification completed.   The patient is insured through [facility] MEDICAID .   Per test claim: PA required; PA submitted to above mentioned insurance via CoverMyMeds Key/confirmation #/EOC [id number] Status is pending

## 2023-12-01 ENCOUNTER — Other Ambulatory Visit (HOSPITAL_COMMUNITY): Payer: Self-pay

## 2023-12-01 NOTE — Telephone Encounter (Signed)
Pharmacy Patient Advocate Encounter  Received notification from [facility] MEDICAID that Prior Authorization for PARoxetine HCl ER 25MG  er tablets  has been APPROVED from 11/30/2023 to 11/29/2024. Ran test claim, Copay is $4.00. This test claim was processed through [facility]- copay amounts may vary at other pharmacies due to pharmacy/plan contracts, or as the patient moves through the different stages of their insurance plan.   PA #/Case ID/Reference #: [id number]

## 2023-12-03 ENCOUNTER — Other Ambulatory Visit: Payer: Self-pay

## 2023-12-03 ENCOUNTER — Other Ambulatory Visit (HOSPITAL_COMMUNITY): Payer: Self-pay

## 2023-12-03 DIAGNOSIS — J4550 Severe persistent asthma, uncomplicated: Secondary | ICD-10-CM | POA: Diagnosis not present

## 2023-12-03 DIAGNOSIS — J383 Other diseases of vocal cords: Secondary | ICD-10-CM | POA: Diagnosis not present

## 2023-12-03 MED ORDER — ALBUTEROL SULFATE (2.5 MG/3ML) 0.083% IN NEBU
3.0000 mL | INHALATION_SOLUTION | Freq: Four times a day (QID) | RESPIRATORY_TRACT | 1 refills | Status: DC | PRN
  Filled 2023-12-03: qty 90, 8d supply, fill #0

## 2023-12-03 MED ORDER — ALBUTEROL SULFATE HFA 108 (90 BASE) MCG/ACT IN AERS
2.0000 | INHALATION_SPRAY | Freq: Four times a day (QID) | RESPIRATORY_TRACT | 1 refills | Status: DC | PRN
  Filled 2023-12-03 – 2024-02-05 (×4): qty 18, 25d supply, fill #0
  Filled 2024-03-25: qty 18, 25d supply, fill #1

## 2023-12-04 ENCOUNTER — Ambulatory Visit (INDEPENDENT_AMBULATORY_CARE_PROVIDER_SITE_OTHER): Payer: Medicaid Other | Admitting: General Practice

## 2023-12-04 ENCOUNTER — Other Ambulatory Visit: Payer: Self-pay

## 2023-12-04 ENCOUNTER — Encounter: Payer: Self-pay | Admitting: General Practice

## 2023-12-04 VITALS — BP 136/86 | HR 99 | Temp 98.6°F | Ht 63.0 in | Wt 282.0 lb

## 2023-12-04 DIAGNOSIS — F419 Anxiety disorder, unspecified: Secondary | ICD-10-CM

## 2023-12-04 DIAGNOSIS — R030 Elevated blood-pressure reading, without diagnosis of hypertension: Secondary | ICD-10-CM | POA: Diagnosis not present

## 2023-12-04 DIAGNOSIS — J4530 Mild persistent asthma, uncomplicated: Secondary | ICD-10-CM

## 2023-12-04 DIAGNOSIS — T782XXD Anaphylactic shock, unspecified, subsequent encounter: Secondary | ICD-10-CM

## 2023-12-04 DIAGNOSIS — D72828 Other elevated white blood cell count: Secondary | ICD-10-CM

## 2023-12-04 DIAGNOSIS — G610 Guillain-Barre syndrome: Secondary | ICD-10-CM

## 2023-12-04 DIAGNOSIS — F32A Depression, unspecified: Secondary | ICD-10-CM | POA: Diagnosis not present

## 2023-12-04 DIAGNOSIS — N132 Hydronephrosis with renal and ureteral calculous obstruction: Secondary | ICD-10-CM

## 2023-12-04 DIAGNOSIS — Z87892 Personal history of anaphylaxis: Secondary | ICD-10-CM

## 2023-12-04 DIAGNOSIS — Z7689 Persons encountering health services in other specified circumstances: Secondary | ICD-10-CM

## 2023-12-04 DIAGNOSIS — D72829 Elevated white blood cell count, unspecified: Secondary | ICD-10-CM | POA: Insufficient documentation

## 2023-12-04 DIAGNOSIS — Z124 Encounter for screening for malignant neoplasm of cervix: Secondary | ICD-10-CM | POA: Insufficient documentation

## 2023-12-04 HISTORY — DX: Elevated white blood cell count, unspecified: D72.829

## 2023-12-04 LAB — CBC WITH DIFFERENTIAL/PLATELET
Basophils Absolute: 0 10*3/uL (ref 0.0–0.1)
Basophils Relative: 0.4 % (ref 0.0–3.0)
Eosinophils Absolute: 0.2 10*3/uL (ref 0.0–0.7)
Eosinophils Relative: 2 % (ref 0.0–5.0)
HCT: 40.6 % (ref 36.0–46.0)
Hemoglobin: 13.6 g/dL (ref 12.0–15.0)
Lymphocytes Relative: 22.6 % (ref 12.0–46.0)
Lymphs Abs: 2.8 10*3/uL (ref 0.7–4.0)
MCHC: 33.5 g/dL (ref 30.0–36.0)
MCV: 81.5 fL (ref 78.0–100.0)
Monocytes Absolute: 0.6 10*3/uL (ref 0.1–1.0)
Monocytes Relative: 4.8 % (ref 3.0–12.0)
Neutro Abs: 8.6 10*3/uL — ABNORMAL HIGH (ref 1.4–7.7)
Neutrophils Relative %: 70.2 % (ref 43.0–77.0)
Platelets: 222 10*3/uL (ref 150.0–400.0)
RBC: 4.98 Mil/uL (ref 3.87–5.11)
RDW: 16.1 % — ABNORMAL HIGH (ref 11.5–15.5)
WBC: 12.2 10*3/uL — ABNORMAL HIGH (ref 4.0–10.5)

## 2023-12-04 LAB — COMPREHENSIVE METABOLIC PANEL
ALT: 19 U/L (ref 0–35)
AST: 10 U/L (ref 0–37)
Albumin: 3.8 g/dL (ref 3.5–5.2)
Alkaline Phosphatase: 72 U/L (ref 39–117)
BUN: 10 mg/dL (ref 6–23)
CO2: 32 meq/L (ref 19–32)
Calcium: 9.2 mg/dL (ref 8.4–10.5)
Chloride: 103 meq/L (ref 96–112)
Creatinine, Ser: 0.8 mg/dL (ref 0.40–1.20)
GFR: 98.23 mL/min (ref >60.00)
Glucose, Bld: 110 mg/dL — ABNORMAL HIGH (ref 70–99)
Potassium: 3.9 meq/L (ref 3.5–5.1)
Sodium: 139 meq/L (ref 135–145)
Total Bilirubin: 0.2 mg/dL (ref 0.2–1.2)
Total Protein: 6.3 g/dL (ref 6.0–8.3)

## 2023-12-04 LAB — COMPLEMENT COMPONENT C1Q: C1q Complement Protein CC1Q: 16.8 mg/dL (ref 10.3–20.5)

## 2023-12-04 NOTE — Assessment & Plan Note (Signed)
Stable

## 2023-12-04 NOTE — Assessment & Plan Note (Addendum)
BP at goal today. No alarm signs on physical exam.  Suspect the readings at the hospital could have been related to the allergic reaction.   Will get her to start keeping a BP log for one week and send it to me via mychart.   Discussed with patient limit salt intake, importance of exercise and diet.   CMP pending. F/u in 2 weeks.

## 2023-12-04 NOTE — Assessment & Plan Note (Signed)
Resolved and stable

## 2023-12-04 NOTE — Assessment & Plan Note (Addendum)
Stable. No alarm signs on physical exam. No more episodes since her discharge from the hospital.   Reviewed office notes from Allergist at [facility] yesterday.   She is schedule to see the vocal cord dysfunction specialist on Monday.   Continue zyrtec, benedryl, singular, Xolair injections per allergist recommendations.   She has epi pen at home if she needs it.

## 2023-12-04 NOTE — Assessment & Plan Note (Signed)
Stable. PHQ and GAD 7 completed. Slightly elevated.   Recent increase in dose of Paxil at the hospital. Suspect the increase dose hasn't taken its full effect. Continue Paxil 25 mg once daily.   Discussed options for therapy as well. She will update if she is interested in the referral.   Follow up in 2 weeks.

## 2023-12-04 NOTE — Assessment & Plan Note (Signed)
Due to pap smear.  Last pap many years ago.   She is interested in a referral to establish with obgyn and would prefer to have the pap smear there. She is also interested in talking about family planning.  Referral placed.

## 2023-12-04 NOTE — Progress Notes (Signed)
New Patient Office Visit  Subjective    Patient ID: Holly Nielsen, female    DOB: 11-11-1991  Age: 32 y.o. MRN: [id number]  CC:  Chief Complaint  Patient presents with   New Patient (Initial Visit)    HPI Holly Nielsen is a 32 y.o. female presents to establish care.  Last PCP: no one recently.  Last physical: several years ago.  Immunizations: Flu, Tdap, Covid - no shots guillan bares syndrome.  Pap: several years ago. History of abnormal pap year ago. History of hpv.  Labs: done at the hospital.    Recent hospitalization at [facility] from 11-22-23 to 11-29-23 due to Anaphylaxis reaction. She was admitted for lower right quadrant pain and while getting a pelvic ultrasound, she become short of breath, flushing and swelling of the face.  She reported that she had the same thing happened previously and had to be put on a epi drip.  She has a history of severe allergic reaction of unknown cause.  It was suspected that the reaction was triggered by supine and positional movement.  This was her fourth episode.  She had a CT abdomen pelvis that showed a small 4 mm right ureter tract stone causing right mild hydro utero nephrosis.  She was given Toradol for pain and then an hour later she developed allergic reaction.  it is unknown if Toradol was the trigger.  She developed wheezing and periorbital edema and received several medications including Benadryl albuterol nebulizer epinephrine DuoNeb Solu-Medrol Zofran as well as IV fluids and was admitted to the hospital. It was recommended to follow-up with an allergist as soon as possible and a referral was placed.  On repeat renal ultrasound on 11/30 no hydronephrosis or stones seen.  She also had left-sided chest pain and all of her cardiac workup was negative at the hospital.  She has a history of anxiety and was asked to continue Ativan as needed Benadryl and her Paxil was increased to 25 mg from 12.5 mg.  She was also found to have high  blood pressure and was asked to follow-up with PCP within 1 week to discuss the need for meds.   Today she reports that she has seen the allergist at [facility], Dr. Carrolyn Leigh. She was started Xolair 300 mg injections every 14 days. She states that she has been on this medication in the past but on a lower dose. She was referred to Dr. Noel Gerold and Baxter Hire Page at [facility] to discuss vocal cord dysfunction. Patient is scheduled to see them on Monday.   Kidney stone- she is feeling much better. Denies any pain, nausea, hematuria or vomiting.   Elevated blood pressure - she was told at the hospital that she needed to follow up for blood pressure. She has a family history of hypertension with father, paternal grandparents. She has a blood pressure cuff at home and has been checking her blood pressures at home. The highest reading she got was 155/100 and the lowest reading she got was 98/50. She denies any blurred vision, no unusual headaches, unilateral weakness or chest pain.    History of anxiety - she was diagnosed with anxiety 4-5 years ago in [place]. She was 12.5 mg of paroxetine and had been working well. It was increased to 25 mg during the hospital admission. She was also started on ativan as needed but she has not used it since discharged. It made her feel loopy and out of it.   Outpatient Encounter Medications  as of 12/04/2023  Medication Sig   albuterol (PROVENTIL) (2.5 MG/3ML) 0.083% nebulizer solution Inhale 1 vial by nebulization every 6 (six) hours as needed for wheezing or shortness of breath.   albuterol (VENTOLIN HFA) 108 (90 Base) MCG/ACT inhaler Inhale 2 puffs into the lungs every 6 (six) hours as needed.   cetirizine (ZYRTEC) 10 MG tablet Take 1 tablet (10 mg total) by mouth 2 (two) times daily.   diphenhydrAMINE (BENADRYL) 25 MG tablet Take 2 tablets (50 mg total) by mouth 4 (four) times daily for 1 day, THEN 2 tablets (50 mg total) in the morning, at noon, and at bedtime for 1 day, THEN 2  tablets (50 mg total) every 8 (eight) hours as needed   EPINEPHrine 0.3 mg/0.3 mL IJ SOAJ injection Inject 0.3 mg into the muscle as needed for anaphylaxis.   Fluticasone-Umeclidin-Vilant (TRELEGY ELLIPTA) 200-62.5-25 MCG/ACT AEPB Inhale 1 Puff into the lungs once daily . Rinse mouth after use   LORazepam (ATIVAN) 0.5 MG tablet Take 1 tablet (0.5 mg total) by mouth every 8 (eight) hours as needed for anxiety.   montelukast (SINGULAIR) 10 MG tablet Take 1 tablet (10 mg total) by mouth at bedtime.   omalizumab (XOLAIR) 300 MG/2ML injection Inject 300 mg into the skin every 14 (fourteen) days.   oxyCODONE-acetaminophen (PERCOCET) 5-325 MG tablet Take 1 tablet by mouth every 6 (six) hours as needed for severe pain (pain score 7-10) or moderate pain (pain score 4-6).   PARoxetine (PAXIL-CR) 25 MG 24 hr tablet Take 1 tablet (25 mg total) by mouth every morning.   prochlorperazine (COMPAZINE) 5 MG tablet Take 1 tablet (5 mg total) by mouth every 8 (eight) hours as needed for nausea or vomiting.   [DISCONTINUED] albuterol (PROVENTIL) (2.5 MG/3ML) 0.083% nebulizer solution Take 3 mLs by nebulization every 6 (six) hours as needed.   [DISCONTINUED] albuterol (VENTOLIN HFA) 108 (90 Base) MCG/ACT inhaler Inhale 2 puffs into the lungs every 6 hours as needed for wheezing.   [DISCONTINUED] diphenhydrAMINE (BENADRYL) 50 MG capsule Take 1 capsule (50 mg total) by mouth every 6 (six) hours as needed for itching or allergies.   [DISCONTINUED] docusate sodium (COLACE) 100 MG capsule Take 1 capsule (100 mg total) by mouth 2 (two) times daily as needed for mild constipation.   [DISCONTINUED] predniSONE (DELTASONE) 10 MG tablet Take 30mg  daily for 3days,Take 20mg  daily for 3days,Take 10mg  daily for 3days, then stop   No facility-administered encounter medications on file as of 12/04/2023.    Past Medical History:  Diagnosis Date   Anxiety and depression    Asthma    Chicken pox    Guillain-Barre (HCC)    Headache     Iron deficiency anemia    Migraine headache with aura    Pica    Urinary tract infection     Past Surgical History:  Procedure Laterality Date   APPENDECTOMY     CHOLECYSTECTOMY     thyroid cyst removal     TONSILLECTOMY     WISDOM TOOTH EXTRACTION      Family History  Problem Relation Age of Onset   Learning disabilities Father    Hypertension Father    Hyperlipidemia Father    Diabetes Father    Asthma Father    Hypertension Paternal Grandmother    Hyperlipidemia Paternal Grandmother    Diabetes Paternal Grandmother    Diabetes Mellitus I Paternal Grandmother    Hypertension Paternal Grandfather    Hyperlipidemia Paternal Actor  Diabetes Paternal Grandfather    Stroke Paternal Grandfather    Heart attack Paternal Grandfather    Leukemia Paternal Great-grandfather     Social History   Socioeconomic History   Marital status: Single    Spouse name: Not on file   Number of children: 0   Years of education: Not on file   Highest education level: Not on file  Occupational History   Occupation: box car - Artist  Tobacco Use   Smoking status: Never   Smokeless tobacco: Never  Vaping Use   Vaping status: Never Used  Substance and Sexual Activity   Alcohol use: Never   Drug use: Never   Sexual activity: Yes    Partners: Male    Birth control/protection: None  Other Topics Concern   Not on file  Social History Narrative   Lives with her boyfriend.    Social Determinants of Health   Financial Resource Strain: Not on file  Food Insecurity: No Food Insecurity (11/22/2023)   Hunger Vital Sign    Worried About Running Out of Food in the Last Year: Never true    Ran Out of Food in the Last Year: Never true  Transportation Needs: No Transportation Needs (11/22/2023)   PRAPARE - Administrator, Civil Service (Medical): No    Lack of Transportation (Non-Medical): No  Physical Activity: Not on file  Stress: Not on file  Social  Connections: Not on file  Intimate Partner Violence: Not At Risk (11/22/2023)   Humiliation, Afraid, Rape, and Kick questionnaire    Fear of Current or Ex-Partner: No    Emotionally Abused: No    Physically Abused: No    Sexually Abused: No    Review of Systems  Constitutional:  Negative for chills, fever, malaise/fatigue and weight loss.  HENT:  Negative for congestion, ear discharge, ear pain, hearing loss, nosebleeds, sinus pain, sore throat and tinnitus.   Eyes:  Negative for blurred vision, double vision, pain, discharge and redness.  Respiratory:  Negative for cough, shortness of breath, wheezing and stridor.   Cardiovascular:  Negative for chest pain, palpitations and leg swelling.  Gastrointestinal:  Negative for abdominal pain, constipation, diarrhea, heartburn, nausea and vomiting.  Genitourinary:  Negative for dysuria, frequency and urgency.  Musculoskeletal:  Negative for myalgias.  Skin:  Negative for rash.  Neurological:  Negative for dizziness, tingling, seizures, weakness and headaches.  Psychiatric/Behavioral:  Negative for depression, substance abuse and suicidal ideas. The patient is not nervous/anxious.         Objective    BP 136/86 (BP Location: Left Arm, Patient Position: Sitting, Cuff Size: Large)   Pulse 99   Temp 98.6 F (37 C) (Oral)   Ht 5\' 3"  (1.6 m)   Wt 282 lb (127.9 kg)   SpO2 97%   BMI 49.95 kg/m   Physical Exam Vitals and nursing note reviewed.  Constitutional:      Appearance: Normal appearance.  HENT:     Head: Normocephalic and atraumatic.     Right Ear: Tympanic membrane, ear canal and external ear normal.     Left Ear: Tympanic membrane, ear canal and external ear normal.     Nose: Nose normal.     Mouth/Throat:     Mouth: Mucous membranes are moist.     Pharynx: Oropharynx is clear.  Eyes:     Conjunctiva/sclera: Conjunctivae normal.     Pupils: Pupils are equal, round, and reactive to light.  Cardiovascular:  Rate and  Rhythm: Normal rate and regular rhythm.     Pulses: Normal pulses.     Heart sounds: Normal heart sounds.  Pulmonary:     Effort: Pulmonary effort is normal.     Breath sounds: Normal breath sounds.  Abdominal:     General: Abdomen is flat. Bowel sounds are normal.     Palpations: Abdomen is soft.  Musculoskeletal:        General: Normal range of motion.     Cervical back: Normal range of motion.  Skin:    General: Skin is warm and dry.     Capillary Refill: Capillary refill takes less than 2 seconds.  Neurological:     General: No focal deficit present.     Mental Status: She is alert and oriented to person, place, and time. Mental status is at baseline.  Psychiatric:        Mood and Affect: Mood normal.        Behavior: Behavior normal.        Thought Content: Thought content normal.        Judgment: Judgment normal.          Assessment & Plan:  Elevated blood pressure reading without diagnosis of hypertension Assessment & Plan: BP at goal today. No alarm signs on physical exam.  Suspect the readings at the hospital could have been related to the allergic reaction.   Will get her to start keeping a BP log for one week and send it to me via mychart.   Discussed with patient limit salt intake, importance of exercise and diet.   CMP pending. F/u in 2 weeks.  Orders: -     Comprehensive metabolic panel  Guillain-Barre syndrome (HCC) Assessment & Plan: Stable.   Mild persistent asthma without complication Assessment & Plan: Stable. Followed by pulmonologist.   Continue Trelegy Ellipta 1 puff daily and Albuterol as needed.   Scheduled for PFTs in January.   Anxiety and depression Assessment & Plan: Stable. PHQ and GAD 7 completed. Slightly elevated.   Recent increase in dose of Paxil at the hospital. Suspect the increase dose hasn't taken its full effect. Continue Paxil 25 mg once daily.   Discussed options for therapy as well. She will update if she is  interested in the referral.   Follow up in 2 weeks.    Establishing care with new doctor, encounter for Assessment & Plan: Immunizations- hx of guillain-barre syndrome.   Pap smear-Interested in referral to obgyn    Other elevated white blood cell (WBC) count Assessment & Plan: Unclear etiology.   Leuocytosis noted during hospitalization. Suspect the allergic reaction could have caused this.   Repeat CBC with diff pending.  Orders: -     CBC with Differential/Platelet  Ureteral stone with hydronephrosis Assessment & Plan: Resolved and stable.    Screening for cervical cancer Assessment & Plan: Due to pap smear.  Last pap many years ago.   She is interested in a referral to establish with obgyn and would prefer to have the pap smear there. She is also interested in talking about family planning.  Referral placed.     Orders: -     Ambulatory referral to Obstetrics / Gynecology  Anaphylaxis, subsequent encounter Assessment & Plan: Stable. No alarm signs on physical exam. No more episodes since her discharge from the hospital.   Reviewed office notes from Allergist at [facility] yesterday.   She is schedule to see the vocal cord dysfunction specialist  on Monday.   Continue zyrtec, benedryl, singular, Xolair injections per allergist recommendations.   She has epi pen at home if she needs it.       Return in about 2 weeks (around 12/18/2023) for discuss HTN and anxiety & depression.   Modesto Charon, NP

## 2023-12-04 NOTE — Assessment & Plan Note (Addendum)
Unclear etiology.   Leuocytosis noted during hospitalization. Suspect the allergic reaction could have caused this.   Repeat CBC with diff pending.

## 2023-12-04 NOTE — Patient Instructions (Signed)
Stop by the lab prior to leaving today. I will notify you of your results once received.   Referral for gyn has been placed and you will get a call or a message on mychart letting you know the next steps. If you do not hear anything within 7 days please let me know.   Please check your BP at home everyday for one week. Please send me those readings via mychart.   It was a pleasure meeting you!

## 2023-12-04 NOTE — Assessment & Plan Note (Signed)
Stable. Followed by pulmonologist.   Continue Trelegy Ellipta 1 puff daily and Albuterol as needed.   Scheduled for PFTs in January.

## 2023-12-04 NOTE — Assessment & Plan Note (Signed)
Immunizations- hx of guillain-barre syndrome.   Pap smear-Interested in referral to obgyn

## 2023-12-07 ENCOUNTER — Other Ambulatory Visit: Payer: Self-pay

## 2023-12-07 DIAGNOSIS — J383 Other diseases of vocal cords: Secondary | ICD-10-CM | POA: Diagnosis not present

## 2023-12-07 DIAGNOSIS — R490 Dysphonia: Secondary | ICD-10-CM | POA: Diagnosis not present

## 2023-12-10 ENCOUNTER — Ambulatory Visit: Payer: Medicaid Other | Admitting: Obstetrics

## 2023-12-10 DIAGNOSIS — Z7689 Persons encountering health services in other specified circumstances: Secondary | ICD-10-CM

## 2023-12-11 ENCOUNTER — Encounter: Payer: Self-pay | Admitting: General Practice

## 2023-12-11 ENCOUNTER — Telehealth: Payer: Self-pay | Admitting: General Practice

## 2023-12-11 NOTE — Telephone Encounter (Signed)
error 

## 2023-12-15 ENCOUNTER — Other Ambulatory Visit: Payer: Self-pay

## 2023-12-17 ENCOUNTER — Ambulatory Visit (INDEPENDENT_AMBULATORY_CARE_PROVIDER_SITE_OTHER): Payer: Medicaid Other | Admitting: Obstetrics

## 2023-12-17 ENCOUNTER — Other Ambulatory Visit (HOSPITAL_COMMUNITY)
Admission: RE | Admit: 2023-12-17 | Discharge: 2023-12-17 | Disposition: A | Discharge status: Home / Self Care | Payer: Medicaid Other | Source: Ambulatory Visit | Attending: Obstetrics | Admitting: Obstetrics

## 2023-12-17 ENCOUNTER — Encounter: Payer: Self-pay | Admitting: Obstetrics

## 2023-12-17 VITALS — BP 135/84 | HR 80 | Ht 63.0 in | Wt 283.0 lb

## 2023-12-17 DIAGNOSIS — Z01419 Encounter for gynecological examination (general) (routine) without abnormal findings: Secondary | ICD-10-CM | POA: Diagnosis not present

## 2023-12-17 DIAGNOSIS — Z124 Encounter for screening for malignant neoplasm of cervix: Secondary | ICD-10-CM

## 2023-12-17 DIAGNOSIS — Z3169 Encounter for other general counseling and advice on procreation: Secondary | ICD-10-CM | POA: Insufficient documentation

## 2023-12-17 NOTE — Progress Notes (Signed)
ANNUAL GYNECOLOGICAL EXAM  SUBJECTIVE  HPI  Holly Nielsen is a 32 y.o.-year-old [id number] who presents for an annual gynecological exam today.  She denies pelvic pain, dyspareunia, abnormal vaginal bleeding or discharge, and UTI symptoms. She would like to discuss infertility today. She has been trying to conceive for 2 years. She has regular periods but is not sure if she is ovulating. She has a recent history of several episodes of anaphylaxis of unknown origin. She also reports that she has some nodules on her thyroid that were being monitored by her doctor in [place]. She has recently made some dietary changes and is working getting regular physical activity.  Medical/Surgical History Past Medical History:  Diagnosis Date   Anxiety and depression    Asthma    Chicken pox    Guillain-Barre (HCC)    Headache    Iron deficiency anemia    Migraine headache with aura    Pica    Urinary tract infection    Past Surgical History:  Procedure Laterality Date   APPENDECTOMY     CHOLECYSTECTOMY     thyroid cyst removal     TONSILLECTOMY     WISDOM TOOTH EXTRACTION      Social History Lives with boyfriend. 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 regular periods every 26-28 days Last Pap: ~ 4 years ago, normal per pt Contraception: none, trying to conceive  Prevention Mammogram: at 41 Colonoscopy: at 46 Flu shot/vaccines: no flu shot d/t h/o Guillain-Barre   Current Medications Outpatient Medications Prior to Visit  Medication Sig   albuterol (PROVENTIL) (2.5 MG/3ML) 0.083% nebulizer solution Inhale 1 vial by  nebulization every 6 (six) hours as needed for wheezing or shortness of breath.   albuterol (VENTOLIN HFA) 108 (90 Base) MCG/ACT inhaler Inhale 2 puffs into the lungs every 6 (six) hours as needed.   cetirizine (ZYRTEC) 10 MG tablet Take 1 tablet (10 mg total) by mouth 2 (two) times daily.   diphenhydrAMINE (BENADRYL) 25 MG tablet Take 2 tablets (50 mg total) by mouth 4 (four) times daily for 1 day, THEN 2 tablets (50 mg total) in the morning, at noon, and at bedtime for 1 day, THEN 2 tablets (50 mg total) every 8 (eight) hours as needed   EPINEPHrine 0.3 mg/0.3 mL IJ SOAJ injection Inject 0.3 mg into the muscle as needed for anaphylaxis.   Fluticasone-Umeclidin-Vilant (TRELEGY ELLIPTA) 200-62.5-25 MCG/ACT AEPB Inhale 1 Puff into the lungs once daily . Rinse mouth after use   LORazepam (ATIVAN) 0.5 MG tablet Take 1 tablet (0.5 mg total) by mouth every 8 (eight) hours as needed for anxiety.   montelukast (SINGULAIR) 10 MG tablet Take 1 tablet (10 mg total) by mouth at bedtime.   omalizumab (XOLAIR) 300 MG/2ML injection Inject 300 mg into the skin every 14 (fourteen) days.   oxyCODONE-acetaminophen (PERCOCET) 5-325 MG tablet Take 1 tablet by mouth every 6 (six) hours as needed for severe  pain (pain score 7-10) or moderate pain (pain score 4-6).   PARoxetine (PAXIL-CR) 25 MG 24 hr tablet Take 1 tablet (25 mg total) by mouth every morning.   prochlorperazine (COMPAZINE) 5 MG tablet Take 1 tablet (5 mg total) by mouth every 8 (eight) hours as needed for nausea or vomiting.   No facility-administered medications prior to visit.        ROS Constitutional: Denied constitutional symptoms, night sweats, recent illness, fatigue, fever, insomnia and weight loss.  Eyes: Denied eye symptoms, eye pain, photophobia, vision change and visual disturbance.  Ears/Nose/Throat/Neck: Denied ear, nose, throat or neck symptoms, hearing loss, nasal discharge, sinus congestion and sore throat.  Cardiovascular: Denied  cardiovascular symptoms, arrhythmia, chest pain/pressure, edema, exercise intolerance, orthopnea and palpitations.  Respiratory: Denied pulmonary symptoms, asthma, pleuritic pain, productive sputum, cough, dyspnea and wheezing.  Gastrointestinal: Denied, gastro-esophageal reflux, melena, nausea and vomiting.  Genitourinary: Denied genitourinary symptoms including symptomatic vaginal discharge, pelvic relaxation issues, and urinary complaints.  Musculoskeletal: Denied musculoskeletal symptoms, stiffness, swelling, muscle weakness and myalgia.  Dermatologic: Denied dermatology symptoms, rash and scar.  Neurologic: Denied neurology symptoms, dizziness, headache, neck pain and syncope.  Psychiatric: Denied psychiatric symptoms, anxiety and depression.  Endocrine: Denied endocrine symptoms including hot flashes and night sweats.    OBJECTIVE  BP 135/84   Pulse 80   Ht 5\' 3"  (1.6 m)   LMP 12/07/2023   BMI 49.95 kg/m    Physical examination General NAD, Conversant  HEENT Atraumatic; Op clear with mmm.  Normo-cephalic. Pupils reactive. Anicteric sclerae  Thyroid/Neck Smooth without nodularity or enlargement. Normal ROM.  Neck Supple.  Skin No rashes, lesions or ulceration. Normal palpated skin turgor. No nodularity.  Breasts: No masses or discharge.  Symmetric.  No axillary adenopathy.  Lungs: Clear to auscultation.No rales or wheezes. Normal Respiratory effort, no retractions.  Heart: NSR.  No murmurs or rubs appreciated. No peripheral edema  Abdomen: Soft.  Non-tender.  No masses.  No HSM. No hernia  Extremities: Moves all appropriately.  Normal ROM for age. No lymphadenopathy.  Neuro: Oriented to PPT.  Normal mood. Normal affect.     Pelvic:   Vulva: Normal appearance.  No lesions.  Vagina: No lesions or abnormalities noted.  Support: Normal pelvic support.  Urethra No masses tenderness or scarring.  Meatus Normal size without lesions or prolapse.  Cervix: Normal appearance.  No  lesions.  Anus: Normal exam.  No lesions.  Perineum: Normal exam.  No lesions.    ASSESSMENT  1) Annual exam 2) Due for Pap 3) Infertility  PLAN 1) Physical exam as noted. Discussed healthy lifestyle choices and preventive care. Encouraged healthy diet, cutting out inflammatory foods, regular exercise. Declines STI testing. 2) Pap collected. F/u based on results. 3) Instructed on tracking ovulation with BBT and OPK. Partner plans semen analysis. Repeat thyroid panel and A1C today d/t slightly abnormal levels at last check.  Discussed possible HSG if ovulation and semen analysis are normal.  Return in 3 months.   Guadlupe Spanish, CNM

## 2023-12-18 ENCOUNTER — Encounter: Payer: Self-pay | Admitting: General Practice

## 2023-12-18 ENCOUNTER — Ambulatory Visit: Payer: Medicaid Other | Admitting: General Practice

## 2023-12-18 ENCOUNTER — Other Ambulatory Visit: Payer: Self-pay

## 2023-12-18 VITALS — BP 118/86 | HR 92 | Temp 98.0°F | Ht 63.0 in | Wt 285.0 lb

## 2023-12-18 DIAGNOSIS — Z6841 Body Mass Index (BMI) 40.0 and over, adult: Secondary | ICD-10-CM | POA: Diagnosis not present

## 2023-12-18 DIAGNOSIS — R7303 Prediabetes: Secondary | ICD-10-CM

## 2023-12-18 DIAGNOSIS — F419 Anxiety disorder, unspecified: Secondary | ICD-10-CM

## 2023-12-18 DIAGNOSIS — R030 Elevated blood-pressure reading, without diagnosis of hypertension: Secondary | ICD-10-CM

## 2023-12-18 DIAGNOSIS — F32A Depression, unspecified: Secondary | ICD-10-CM

## 2023-12-18 LAB — HEMOGLOBIN A1C
Est. average glucose Bld gHb Est-mCnc: 117 mg/dL
Hgb A1c MFr Bld: 5.7 % — ABNORMAL HIGH (ref 4.8–5.6)

## 2023-12-18 LAB — THYROID PANEL WITH TSH
Free Thyroxine Index: 2.5 (ref 1.2–4.9)
T3 Uptake Ratio: 25 % (ref 24–39)
T4, Total: 10 ug/dL (ref 4.5–12.0)
TSH: 1.09 u[IU]/mL (ref 0.450–4.500)

## 2023-12-18 NOTE — Patient Instructions (Addendum)
It is important that you improve your diet. Please limit carbohydrates in the form of white bread, rice, pasta, sweets, fast food, fried food, sugary drinks, etc. Increase your consumption of fresh fruits and vegetables, whole grains, lean protein.  Ensure you are consuming 64 ounces of water daily.

## 2023-12-18 NOTE — Assessment & Plan Note (Signed)
BP at goal today with reading of 118/86.  Home readings are consistent with the reading today.   Will continue to monitor.   Discussed the importance of limit salt intake, healthy diet and exercise.   Referral placed for healthy weight and wellness per patient request.

## 2023-12-18 NOTE — Assessment & Plan Note (Signed)
Slightly improved with hemoglobin A1c of 5.7.   Discussed the importance of limiting carbohydrates in the form of white bread, rice, pasta, sweets, fast food, fried food, sugary drinks, etc. Increasing consumption of fresh fruits and vegetables, whole grains, lean protein and consuming 64 ounces of water daily.  Referral placed for healthy weight and wellness per patient request.

## 2023-12-18 NOTE — Assessment & Plan Note (Signed)
Controlled.   Continue Paxil 25 mg once daily.

## 2023-12-18 NOTE — Progress Notes (Signed)
Established Patient Office Visit  Subjective   Patient ID: Holly Nielsen, female    DOB: 23-Feb-1991  Age: 32 y.o. MRN: [id number]  Chief Complaint  Patient presents with   2 Week Follow-up Hypertension, Anxiety, and Depression    Says BP has been I normal range. Medication is helping anxiety and deprresion.    HPI  Holly Nielsen is a 32 year old female, with past medical history of asthma, cyst of thyroid, guilliain-barre syndrome, eczema, ADD, anxiety and depression, presents today for a follow up on hypertension, anxiety and depression.   Hypertension: overall doing well. Blood pressure readings at home range between 120s/80s-90s. No blurred vision, chest pain, headaches.   Anxiety/depression: Has been doing well since the increased dose of Paxil. Denies any SI and HI. No concerns today.  Prediabetes: had A1c checked at the GYN yesterday and it was 5.7 which has improved from 4 months ago. She has been monitoring her diet and exercising. She denies any polyuria, polyphagia or polydipsia. Denies any numbness or tingling.   Patient Active Problem List   Diagnosis Date Noted   Prediabetes 12/18/2023   Infertility counseling 12/17/2023   Elevated blood pressure reading without diagnosis of hypertension 12/04/2023   Mild persistent asthma without complication 12/04/2023   Morbid obesity with BMI of 50.0-59.9, adult (HCC) 09/16/2021   Anxiety and depression 09/16/2021   History of iron deficiency anemia 09/16/2021   ADD (attention deficit disorder) 02/26/2018   Guillain-Barre syndrome (HCC) 09/28/2017   Eczema 02/04/2013   Past Medical History:  Diagnosis Date   Acute asthma exacerbation 10/15/2017   Acute respiratory failure with hypoxia (HCC) 11/23/2023   AKI (acute kidney injury) (HCC) 11/23/2023   Allergic reaction 07/31/2023   Amenorrhea 09/16/2021   Anaphylaxis 07/30/2023   Anxiety and depression    Asthma    Chicken pox    Cyst of thyroid 09/16/2021    Guillain-Barre (HCC)    H/O removal of thyroglossal duct cyst 12/27/2017   Headache    Iron deficiency anemia    Leucocytosis 12/04/2023   Migraine headache with aura    Pica    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   Severe persistent asthma 03/13/2022   Ureteral stone with hydronephrosis 11/23/2023   Urinary tract infection    Allergies  Allergen Reactions   Capsicum Anaphylaxis   Nsaids Swelling   Buspirone Diarrhea   Other Nausea And Vomiting and Diarrhea    Green bell peppers   Celery Oil Diarrhea   Iron Sucrose Other (See Comments)     Dizziness, Pt became flushed, hot and diaphoretic after completion of infusion.      Nutricap [Actical] Diarrhea   Pedi-Pre Tape Spray [Wound Dressing Adhesive] Hives   Wound Dressings Hives   Anise Extract [Flavoring Agent] Hives and Rash    Vanilla Extract         12/04/2023   11:34 AM  Depression screen PHQ 2/9  Decreased Interest 1  Down, Depressed, Hopeless 2  PHQ - 2 Score 3  Altered sleeping 3  Tired, decreased energy 2  Change in appetite 2  Feeling bad or failure about yourself  2  Trouble concentrating 1  Moving slowly or fidgety/restless 0  Suicidal thoughts 0  PHQ-9 Score 13  Difficult doing work/chores Not difficult at all       12/04/2023   11:34 AM  GAD 7 : Generalized Anxiety Score  Nervous, Anxious, on Edge 1  Control/stop worrying 1  Worry too much - different  things 2  Trouble relaxing 1  Restless 0  Easily annoyed or irritable 0  Afraid - awful might happen 0  Total GAD 7 Score 5  Anxiety Difficulty Not difficult at all      Review of Systems  Constitutional:  Negative for chills and fever.  Respiratory:  Negative for shortness of breath.   Cardiovascular:  Negative for chest pain.  Gastrointestinal:  Negative for abdominal pain, constipation, diarrhea, heartburn, nausea and vomiting.  Genitourinary:  Negative for dysuria, frequency and urgency.  Neurological:  Negative for dizziness and headaches.   Endo/Heme/Allergies:  Negative for polydipsia.  Psychiatric/Behavioral:  Negative for depression and suicidal ideas. The patient is not nervous/anxious.       Objective:     BP 118/86 (BP Location: Left Arm, Patient Position: Sitting, Cuff Size: Large)   Pulse 92   Temp 98 F (36.7 C) (Oral)   Ht 5\' 3"  (1.6 m)   Wt 285 lb (129.3 kg)   LMP 12/07/2023   SpO2 98%   BMI 50.49 kg/m  BP Readings from Last 3 Encounters:  12/18/23 118/86  12/17/23 135/84  12/04/23 136/86   Wt Readings from Last 3 Encounters:  12/18/23 285 lb (129.3 kg)  12/17/23 283 lb (128.4 kg)  12/04/23 282 lb (127.9 kg)      Physical Exam Vitals and nursing note reviewed.  Constitutional:      Appearance: Normal appearance.  Cardiovascular:     Rate and Rhythm: Normal rate and regular rhythm.     Pulses: Normal pulses.     Heart sounds: Normal heart sounds.  Pulmonary:     Effort: Pulmonary effort is normal.     Breath sounds: Normal breath sounds.  Neurological:     Mental Status: She is alert and oriented to person, place, and time.  Psychiatric:        Mood and Affect: Mood normal.        Behavior: Behavior normal.        Thought Content: Thought content normal.        Judgment: Judgment normal.      No results found for any visits on 12/18/23.     The ASCVD Risk score (Arnett DK, et al., 2019) failed to calculate for the following reasons:   The 2019 ASCVD risk score is only valid for ages 72 to 29    Assessment & Plan:  Elevated blood pressure reading without diagnosis of hypertension Assessment & Plan: BP at goal today with reading of 118/86.  Home readings are consistent with the reading today.   Will continue to monitor.   Discussed the importance of limit salt intake, healthy diet and exercise.   Referral placed for healthy weight and wellness per patient request.  Orders: -     Amb Ref to Medical Weight Management  Prediabetes Assessment & Plan: Slightly improved  with hemoglobin A1c of 5.7.   Discussed the importance of limiting carbohydrates in the form of white bread, rice, pasta, sweets, fast food, fried food, sugary drinks, etc. Increasing consumption of fresh fruits and vegetables, whole grains, lean protein and consuming 64 ounces of water daily.  Referral placed for healthy weight and wellness per patient request.  Orders: -     Amb Ref to Medical Weight Management  Morbid obesity with BMI of 50.0-59.9, adult [facility]) Assessment & Plan: Has started making dietary changes already. Discussed the importance of continuing that and exercise.   Referral placed for healthy weight and wellness per  patient request.  Orders: -     Amb Ref to Medical Weight Management  Anxiety and depression Assessment & Plan: Controlled.   Continue Paxil 25 mg once daily.       Return in about 6 months (around 06/17/2024) for physical .    Modesto Charon, NP

## 2023-12-18 NOTE — Assessment & Plan Note (Signed)
Has started making dietary changes already. Discussed the importance of continuing that and exercise.   Referral placed for healthy weight and wellness per patient request.

## 2023-12-24 ENCOUNTER — Ambulatory Visit: Payer: Medicaid Other | Admitting: Internal Medicine

## 2023-12-24 ENCOUNTER — Encounter: Payer: Self-pay | Admitting: Internal Medicine

## 2023-12-24 VITALS — BP 102/60 | HR 95 | Temp 98.1°F | Ht 63.0 in | Wt 287.0 lb

## 2023-12-24 DIAGNOSIS — B349 Viral infection, unspecified: Secondary | ICD-10-CM

## 2023-12-24 LAB — POCT INFLUENZA A/B
Influenza A, POC: NEGATIVE
Influenza B, POC: NEGATIVE

## 2023-12-24 LAB — CYTOLOGY - PAP
Comment: NEGATIVE
Diagnosis: NEGATIVE
Diagnosis: REACTIVE
High risk HPV: NEGATIVE

## 2023-12-24 LAB — POC COVID19 BINAXNOW: SARS Coronavirus 2 Ag: NEGATIVE

## 2023-12-24 NOTE — Addendum Note (Signed)
Addended by: Eual Fines on: 12/24/2023 03:56 PM   Modules accepted: Orders

## 2023-12-24 NOTE — Assessment & Plan Note (Signed)
Started yesterday Flu and COVID negative Discussed supportive care with rest and tylenol Out of work till 12/30 If worsens with more sinus symptoms at end of next week---would try empiric doxy 100 bid x 7 days

## 2023-12-24 NOTE — Telephone Encounter (Signed)
Put on my schedule today--I will check her then

## 2023-12-24 NOTE — Progress Notes (Signed)
Subjective:    Patient ID: Holly Nielsen, female    DOB: [birth year], 32 y.o.   MRN: [id number]  HPI Here due to respiratory infection  Got sick last night Felt fatigue and body pain---but had also fallen 12/24 in bathtub This morning--awoke with head and chest congestion Fatigued and aching is worse Chills and headache Slight sore throat----slight dry cough Felt feverish--but had no way to measure No sweats No ear pain Slight SOB--when moving around or talking  Asthma not overly worse Slight wheezing only  Used tylenol 2 --- helped the headache  Current Outpatient Medications on File Prior to Visit  Medication Sig Dispense Refill   albuterol (PROVENTIL) (2.5 MG/3ML) 0.083% nebulizer solution Inhale 1 vial by nebulization every 6 (six) hours as needed for wheezing or shortness of breath. 270 mL 0   albuterol (VENTOLIN HFA) 108 (90 Base) MCG/ACT inhaler Inhale 2 puffs into the lungs every 6 (six) hours as needed. 18 g 1   cetirizine (ZYRTEC) 10 MG tablet Take 1 tablet (10 mg total) by mouth 2 (two) times daily. 120 tablet 0   EPINEPHrine 0.3 mg/0.3 mL IJ SOAJ injection Inject 0.3 mg into the muscle as needed for anaphylaxis. 2 each 5   Fluticasone-Umeclidin-Vilant (TRELEGY ELLIPTA) 200-62.5-25 MCG/ACT AEPB Inhale 1 Puff into the lungs once daily . Rinse mouth after use 60 each 3   montelukast (SINGULAIR) 10 MG tablet Take 1 tablet (10 mg total) by mouth at bedtime. 30 tablet 0   omalizumab (XOLAIR) 300 MG/2ML injection Inject 300 mg into the skin every 14 (fourteen) days.     PARoxetine (PAXIL-CR) 25 MG 24 hr tablet Take 1 tablet (25 mg total) by mouth every morning. 30 tablet 0   diphenhydrAMINE (BENADRYL) 25 MG tablet Take 2 tablets (50 mg total) by mouth 4 (four) times daily for 1 day, THEN 2 tablets (50 mg total) in the morning, at noon, and at bedtime for 1 day, THEN 2 tablets (50 mg total) every 8 (eight) hours as needed 60 tablet 0   No current facility-administered  medications on file prior to visit.    Allergies  Allergen Reactions   Capsicum Anaphylaxis   Nsaids Swelling   Buspirone Diarrhea   Other Nausea And Vomiting and Diarrhea    Green bell peppers   Celery Oil Diarrhea   Iron Sucrose Other (See Comments)     Dizziness, Pt became flushed, hot and diaphoretic after completion of infusion.      Nutricap [Actical] Diarrhea   Pedi-Pre Tape Spray [Wound Dressing Adhesive] Hives   Wound Dressings Hives   Anise Extract [Flavoring Agent] Hives and Rash    Vanilla Extract    Past Medical History:  Diagnosis Date   Acute asthma exacerbation 10/15/2017   Acute respiratory failure with hypoxia (HCC) 11/23/2023   AKI (acute kidney injury) (HCC) 11/23/2023   Allergic reaction 07/31/2023   Amenorrhea 09/16/2021   Anaphylaxis 07/30/2023   Anxiety and depression    Asthma    Chicken pox    Cyst of thyroid 09/16/2021   Guillain-Barre (HCC)    H/O removal of thyroglossal duct cyst 12/27/2017   Headache    Iron deficiency anemia    Leucocytosis 12/04/2023   Migraine headache with aura    Pica    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   Severe persistent asthma 03/13/2022   Ureteral stone with hydronephrosis 11/23/2023   Urinary tract infection     Past Surgical History:  Procedure Laterality Date   APPENDECTOMY  CHOLECYSTECTOMY     thyroid cyst removal     TONSILLECTOMY     WISDOM TOOTH EXTRACTION      Family History  Problem Relation Age of Onset   Learning disabilities Father    Hypertension Father    Hyperlipidemia Father    Diabetes Father    Asthma Father    Hypertension Paternal Grandmother    Hyperlipidemia Paternal Grandmother    Diabetes Paternal Grandmother    Diabetes Mellitus I Paternal Grandmother    Hypertension Paternal Grandfather    Hyperlipidemia Paternal Grandfather    Diabetes Paternal Grandfather    Stroke Paternal Grandfather    Heart attack Paternal Grandfather    Leukemia Paternal Great-grandfather      Social History   Socioeconomic History   Marital status: Single    Spouse name: Not on file   Number of children: 0   Years of education: Not on file   Highest education level: Not on file  Occupational History   Occupation: box car - Artist  Tobacco Use   Smoking status: Never   Smokeless tobacco: Never  Vaping Use   Vaping status: Never Used  Substance and Sexual Activity   Alcohol use: Never   Drug use: Never   Sexual activity: Yes    Partners: Male    Birth control/protection: None  Other Topics Concern   Not on file  Social History Narrative   Lives with her boyfriend.    Social Drivers of Corporate investment banker Strain: Not on file  Food Insecurity: No Food Insecurity (11/22/2023)   Hunger Vital Sign    Worried About Running Out of Food in the Last Year: Never true    Ran Out of Food in the Last Year: Never true  Transportation Needs: No Transportation Needs (11/22/2023)   PRAPARE - Administrator, Civil Service (Medical): No    Lack of Transportation (Non-Medical): No  Physical Activity: Not on file  Stress: Not on file  Social Connections: Not on file  Intimate Partner Violence: Not At Risk (11/22/2023)   Humiliation, Afraid, Rape, and Kick questionnaire    Fear of Current or Ex-Partner: No    Emotionally Abused: No    Physically Abused: No    Sexually Abused: No   Review of Systems Some nausea --no vomiting Able to eat and drink    Objective:   Physical Exam Constitutional:      Appearance: Normal appearance.  HENT:     Head:     Comments: No sinus tenderness    Right Ear: Tympanic membrane and ear canal normal.     Left Ear: Tympanic membrane and ear canal normal.     Mouth/Throat:     Pharynx: No oropharyngeal exudate or posterior oropharyngeal erythema.  Pulmonary:     Effort: Pulmonary effort is normal.     Breath sounds: Normal breath sounds. No wheezing or rales.  Musculoskeletal:     Cervical back: Neck  supple.  Lymphadenopathy:     Cervical: No cervical adenopathy.  Neurological:     Mental Status: She is alert.            Assessment & Plan:

## 2023-12-24 NOTE — Telephone Encounter (Signed)
I spoke with pt; symptoms started on 12/23/23 with dry cough and slight SOB upon exertion., inhaler helps somewhat; no fever,no CP and no wheezing. Pt scheduled appt to see Dr Alphonsus Sias 12/24/23 at 3:15. Sending note to Dr Julieanne Manson pool pt has not checked covid test.

## 2023-12-25 ENCOUNTER — Encounter: Payer: Self-pay | Admitting: Obstetrics

## 2023-12-29 ENCOUNTER — Other Ambulatory Visit: Payer: Self-pay

## 2024-01-04 ENCOUNTER — Other Ambulatory Visit (HOSPITAL_COMMUNITY): Payer: Self-pay

## 2024-01-04 DIAGNOSIS — J45909 Unspecified asthma, uncomplicated: Secondary | ICD-10-CM | POA: Diagnosis not present

## 2024-01-04 DIAGNOSIS — J342 Deviated nasal septum: Secondary | ICD-10-CM | POA: Diagnosis not present

## 2024-01-04 MED ORDER — HYDROCODONE-ACETAMINOPHEN 5-325 MG PO TABS
ORAL_TABLET | ORAL | 0 refills | Status: DC
  Filled 2024-01-04: qty 15, 3d supply, fill #0

## 2024-01-05 ENCOUNTER — Ambulatory Visit: Payer: Medicaid Other | Admitting: Nurse Practitioner

## 2024-01-06 ENCOUNTER — Ambulatory Visit: Payer: Medicaid Other | Admitting: General Practice

## 2024-01-08 ENCOUNTER — Other Ambulatory Visit: Payer: Self-pay | Admitting: Otolaryngology

## 2024-01-13 ENCOUNTER — Other Ambulatory Visit: Payer: Self-pay

## 2024-01-13 ENCOUNTER — Encounter (HOSPITAL_COMMUNITY): Payer: Self-pay | Admitting: Otolaryngology

## 2024-01-13 NOTE — Progress Notes (Signed)
 PCP - Jolanda Nation, NP  Cardiologist -   PPM/ICD - denies Device Orders - n/a Rep Notified - n/a  Chest x-ray - 11-26-23 EKG - 11-26-23 Stress Test -  ECHO -  Cardiac Cath -   CPAP - denies  DM- denies  Blood Thinner Instructions: denies Aspirin Instructions:   ERAS Protcol - Clear liquids until 8:30am  COVID TEST- n/a  Anesthesia review: no  Patient verbally denies any shortness of breath, fever, cough and chest pain during phone call   -------------  SDW INSTRUCTIONS given:  Your procedure is scheduled on January 15, 2024.  Report to [facility] Main Entrance "A" at 9:00 A.M., and check in at the Admitting office.  Call this number if you have problems the morning of surgery:  [phone]   Remember:  Do not eat after midnight the night before your surgery  You may drink clear liquids until 8:30 the morning of your surgery.   Clear liquids allowed are: Water, Non-Citrus Juices (without pulp), Carbonated Beverages, Clear Tea, Black Coffee Only, and Gatorade    Take these medicines the morning of surgery with A SIP OF WATER  (TRELEGY ELLIPTA )  PARoxetine  (PAXIL -CR)   IF NEEDED albuterol  (PROVENTIL ) nebulizer solution  albuterol  (VENTOLIN  HFA) inhaler  diphenhydrAMINE  (BENADRYL )  (PRIMATENE  MIST)  HYDROcodone -acetaminophen  (NORCO/VICODIN) 5  EPINEPHrine    As of today, STOP taking any Aspirin (unless otherwise instructed by your surgeon) Aleve , Naproxen , Ibuprofen, Motrin, Advil, Goody's, BC's, all herbal medications, fish oil, and all vitamins.                      Do not wear jewelry, make up, or nail polish            Do not wear lotions, powders, perfumes/colognes, or deodorant.            Do not shave 48 hours prior to surgery.  Men may shave face and neck.            Do not bring valuables to the hospital.            [facility] is not responsible for any belongings or valuables.  Do NOT Smoke (Tobacco/Vaping) 24 hours prior to your procedure If  you use a CPAP at night, you may bring all equipment for your overnight stay.   Contacts, glasses, dentures or bridgework may not be worn into surgery.      For patients admitted to the hospital, discharge time will be determined by your treatment team.   Patients discharged the day of surgery will not be allowed to drive home, and someone needs to stay with them for 24 hours.    Special instructions:   [facility]- Preparing For Surgery  Before surgery, you can play an important role. Because skin is not sterile, your skin needs to be as free of germs as possible. You can reduce the number of germs on your skin by washing with CHG (chlorahexidine gluconate) Soap before surgery.  CHG is an antiseptic cleaner which kills germs and bonds with the skin to continue killing germs even after washing.    Oral Hygiene is also important to reduce your risk of infection.  Remember - BRUSH YOUR TEETH THE MORNING OF SURGERY WITH YOUR REGULAR TOOTHPASTE  Please do not use if you have an allergy to CHG or antibacterial soaps. If your skin becomes reddened/irritated stop using the CHG.  Do not shave (including legs and underarms) for at least 48 hours  prior to first CHG shower. It is OK to shave your face.  Please follow these instructions carefully.   Shower the NIGHT BEFORE SURGERY and the MORNING OF SURGERY with DIAL Soap.   Pat yourself dry with a CLEAN TOWEL.  Wear CLEAN PAJAMAS to bed the night before surgery  Place CLEAN SHEETS on your bed the night of your first shower and DO NOT SLEEP WITH PETS.   Day of Surgery: Please shower morning of surgery  Wear Clean/Comfortable clothing the morning of surgery Do not apply any deodorants/lotions.   Remember to brush your teeth WITH YOUR REGULAR TOOTHPASTE.   Questions were answered. Patient verbalized understanding of instructions.

## 2024-01-15 ENCOUNTER — Ambulatory Visit (HOSPITAL_BASED_OUTPATIENT_CLINIC_OR_DEPARTMENT_OTHER): Payer: Medicaid Other | Admitting: Anesthesiology

## 2024-01-15 ENCOUNTER — Encounter (HOSPITAL_COMMUNITY): Payer: Self-pay | Admitting: Otolaryngology

## 2024-01-15 ENCOUNTER — Encounter (HOSPITAL_COMMUNITY)
Admission: RE | Disposition: A | Discharge status: Home / Self Care | Payer: Self-pay | Source: Home / Self Care | Attending: Otolaryngology

## 2024-01-15 ENCOUNTER — Ambulatory Visit (HOSPITAL_BASED_OUTPATIENT_CLINIC_OR_DEPARTMENT_OTHER): Payer: Medicaid Other | Admitting: Pulmonary Disease

## 2024-01-15 ENCOUNTER — Encounter (HOSPITAL_BASED_OUTPATIENT_CLINIC_OR_DEPARTMENT_OTHER): Payer: Medicaid Other

## 2024-01-15 ENCOUNTER — Ambulatory Visit (HOSPITAL_COMMUNITY)
Admission: RE | Admit: 2024-01-15 | Discharge: 2024-01-15 | Disposition: A | Discharge status: Home / Self Care | Payer: Medicaid Other | Attending: Otolaryngology | Admitting: Otolaryngology

## 2024-01-15 ENCOUNTER — Other Ambulatory Visit: Payer: Self-pay

## 2024-01-15 ENCOUNTER — Ambulatory Visit (HOSPITAL_COMMUNITY): Payer: Medicaid Other | Admitting: Anesthesiology

## 2024-01-15 DIAGNOSIS — J342 Deviated nasal septum: Secondary | ICD-10-CM

## 2024-01-15 DIAGNOSIS — J3489 Other specified disorders of nose and nasal sinuses: Secondary | ICD-10-CM | POA: Insufficient documentation

## 2024-01-15 DIAGNOSIS — Z6841 Body Mass Index (BMI) 40.0 and over, adult: Secondary | ICD-10-CM | POA: Insufficient documentation

## 2024-01-15 DIAGNOSIS — J45909 Unspecified asthma, uncomplicated: Secondary | ICD-10-CM | POA: Diagnosis not present

## 2024-01-15 DIAGNOSIS — J4550 Severe persistent asthma, uncomplicated: Secondary | ICD-10-CM | POA: Insufficient documentation

## 2024-01-15 DIAGNOSIS — J343 Hypertrophy of nasal turbinates: Secondary | ICD-10-CM

## 2024-01-15 DIAGNOSIS — Z7951 Long term (current) use of inhaled steroids: Secondary | ICD-10-CM | POA: Diagnosis not present

## 2024-01-15 DIAGNOSIS — F418 Other specified anxiety disorders: Secondary | ICD-10-CM | POA: Diagnosis not present

## 2024-01-15 DIAGNOSIS — E66813 Obesity, class 3: Secondary | ICD-10-CM | POA: Diagnosis not present

## 2024-01-15 DIAGNOSIS — I10 Essential (primary) hypertension: Secondary | ICD-10-CM

## 2024-01-15 DIAGNOSIS — J4520 Mild intermittent asthma, uncomplicated: Secondary | ICD-10-CM | POA: Diagnosis not present

## 2024-01-15 HISTORY — PX: SEPTOPLASTY: SHX2393

## 2024-01-15 HISTORY — PX: TURBINATE REDUCTION: SHX6157

## 2024-01-15 LAB — CBC
HCT: 40 % (ref 36.0–46.0)
Hemoglobin: 12.8 g/dL (ref 12.0–15.0)
MCH: 26.2 pg (ref 26.0–34.0)
MCHC: 32 g/dL (ref 30.0–36.0)
MCV: 81.8 fL (ref 80.0–100.0)
Platelets: 205 10*3/uL (ref 150–400)
RBC: 4.89 MIL/uL (ref 3.87–5.11)
RDW: 14.3 % (ref 11.5–15.5)
WBC: 6.7 10*3/uL (ref 4.0–10.5)
nRBC: 0 % (ref 0.0–0.2)

## 2024-01-15 LAB — BASIC METABOLIC PANEL
Anion gap: 11 (ref 5–15)
BUN: 10 mg/dL (ref 6–20)
CO2: 22 mmol/L (ref 22–32)
Calcium: 8.9 mg/dL (ref 8.9–10.3)
Chloride: 105 mmol/L (ref 98–111)
Creatinine, Ser: 0.9 mg/dL (ref 0.44–1.00)
GFR, Estimated: >60 mL/min (ref >60)
Glucose, Bld: 101 mg/dL — ABNORMAL HIGH (ref 70–99)
Potassium: 4.3 mmol/L (ref 3.5–5.1)
Sodium: 138 mmol/L (ref 135–145)

## 2024-01-15 LAB — POCT PREGNANCY, URINE: Preg Test, Ur: NEGATIVE

## 2024-01-15 SURGERY — REDUCTION, NASAL TURBINATE
Anesthesia: General | Site: Nose | Laterality: Bilateral

## 2024-01-15 MED ORDER — OXYMETAZOLINE HCL 0.05 % NA SOLN
NASAL | Status: DC | PRN
  Administered 2024-01-15: 1 via TOPICAL

## 2024-01-15 MED ORDER — CHLORHEXIDINE GLUCONATE 0.12 % MT SOLN
15.0000 mL | Freq: Once | OROMUCOSAL | Status: AC
  Administered 2024-01-15: 15 mL via OROMUCOSAL
  Filled 2024-01-15: qty 15

## 2024-01-15 MED ORDER — LIDOCAINE 2% (20 MG/ML) 5 ML SYRINGE
INTRAMUSCULAR | Status: AC
  Filled 2024-01-15: qty 5

## 2024-01-15 MED ORDER — ROCURONIUM BROMIDE 10 MG/ML (PF) SYRINGE
PREFILLED_SYRINGE | INTRAVENOUS | Status: DC | PRN
  Administered 2024-01-15: 70 mg via INTRAVENOUS

## 2024-01-15 MED ORDER — ONDANSETRON HCL 4 MG/2ML IJ SOLN
INTRAMUSCULAR | Status: DC | PRN
  Administered 2024-01-15: 4 mg via INTRAVENOUS

## 2024-01-15 MED ORDER — ACETAMINOPHEN 500 MG PO TABS
ORAL_TABLET | ORAL | Status: AC
  Filled 2024-01-15: qty 2

## 2024-01-15 MED ORDER — MIDAZOLAM HCL 2 MG/2ML IJ SOLN
INTRAMUSCULAR | Status: AC
  Filled 2024-01-15: qty 2

## 2024-01-15 MED ORDER — FLUORESCEIN SODIUM 1 MG OP STRP
ORAL_STRIP | OPHTHALMIC | Status: AC
  Filled 2024-01-15: qty 1

## 2024-01-15 MED ORDER — SODIUM CHLORIDE 0.9% FLUSH
3.0000 mL | Freq: Two times a day (BID) | INTRAVENOUS | Status: DC
  Administered 2024-01-15: 10 mL via INTRAVENOUS

## 2024-01-15 MED ORDER — DEXAMETHASONE SODIUM PHOSPHATE 10 MG/ML IJ SOLN
INTRAMUSCULAR | Status: DC | PRN
  Administered 2024-01-15: 8 mg via INTRAVENOUS

## 2024-01-15 MED ORDER — PROPOFOL 10 MG/ML IV BOLUS
INTRAVENOUS | Status: AC
  Filled 2024-01-15: qty 20

## 2024-01-15 MED ORDER — ROCURONIUM BROMIDE 10 MG/ML (PF) SYRINGE
PREFILLED_SYRINGE | INTRAVENOUS | Status: AC
  Filled 2024-01-15: qty 10

## 2024-01-15 MED ORDER — PHENYLEPHRINE 80 MCG/ML (10ML) SYRINGE FOR IV PUSH (FOR BLOOD PRESSURE SUPPORT)
PREFILLED_SYRINGE | INTRAVENOUS | Status: AC
  Filled 2024-01-15: qty 10

## 2024-01-15 MED ORDER — LIDOCAINE-EPINEPHRINE 1 %-1:100000 IJ SOLN
INTRAMUSCULAR | Status: AC
  Filled 2024-01-15: qty 1

## 2024-01-15 MED ORDER — HYDRALAZINE HCL 20 MG/ML IJ SOLN
INTRAMUSCULAR | Status: AC
  Filled 2024-01-15: qty 1

## 2024-01-15 MED ORDER — MIDAZOLAM HCL 2 MG/2ML IJ SOLN
INTRAMUSCULAR | Status: DC | PRN
  Administered 2024-01-15: 2 mg via INTRAVENOUS

## 2024-01-15 MED ORDER — DROPERIDOL 2.5 MG/ML IJ SOLN
0.6250 mg | Freq: Once | INTRAMUSCULAR | Status: AC | PRN
  Administered 2024-01-15: 0.625 mg via INTRAVENOUS

## 2024-01-15 MED ORDER — SUGAMMADEX SODIUM 200 MG/2ML IV SOLN
INTRAVENOUS | Status: DC | PRN
  Administered 2024-01-15 (×2): 200 mg via INTRAVENOUS

## 2024-01-15 MED ORDER — DEXMEDETOMIDINE HCL IN NACL 80 MCG/20ML IV SOLN
INTRAVENOUS | Status: DC | PRN
  Administered 2024-01-15: 8 ug via INTRAVENOUS

## 2024-01-15 MED ORDER — ONDANSETRON HCL 4 MG/2ML IJ SOLN
INTRAMUSCULAR | Status: AC
  Filled 2024-01-15: qty 8

## 2024-01-15 MED ORDER — LIDOCAINE-EPINEPHRINE 1 %-1:100000 IJ SOLN
INTRAMUSCULAR | Status: DC | PRN
  Administered 2024-01-15: 6 mL

## 2024-01-15 MED ORDER — OXYCODONE HCL 5 MG PO TABS: 5.0000 mg | ORAL_TABLET | Freq: Once | ORAL | Status: DC | PRN

## 2024-01-15 MED ORDER — FENTANYL CITRATE (PF) 250 MCG/5ML IJ SOLN
INTRAMUSCULAR | Status: DC | PRN
  Administered 2024-01-15: 100 ug via INTRAVENOUS
  Administered 2024-01-15: 50 ug via INTRAVENOUS

## 2024-01-15 MED ORDER — FENTANYL CITRATE (PF) 100 MCG/2ML IJ SOLN
INTRAMUSCULAR | Status: AC
  Filled 2024-01-15: qty 2

## 2024-01-15 MED ORDER — OXYCODONE HCL 5 MG/5ML PO SOLN: 5.0000 mg | Freq: Once | ORAL | Status: DC | PRN

## 2024-01-15 MED ORDER — FENTANYL CITRATE (PF) 250 MCG/5ML IJ SOLN
INTRAMUSCULAR | Status: AC
  Filled 2024-01-15: qty 5

## 2024-01-15 MED ORDER — LIDOCAINE 2% (20 MG/ML) 5 ML SYRINGE
INTRAMUSCULAR | Status: DC | PRN
  Administered 2024-01-15: 100 mg via INTRAVENOUS

## 2024-01-15 MED ORDER — SODIUM CHLORIDE 0.9 % IV SOLN: INTRAVENOUS | Status: DC | PRN

## 2024-01-15 MED ORDER — EPINEPHRINE 1 MG/10ML IJ SOSY
PREFILLED_SYRINGE | INTRAMUSCULAR | Status: AC
  Filled 2024-01-15: qty 10

## 2024-01-15 MED ORDER — FENTANYL CITRATE (PF) 100 MCG/2ML IJ SOLN
25.0000 ug | INTRAMUSCULAR | Status: DC | PRN
  Administered 2024-01-15 (×2): 50 ug via INTRAVENOUS

## 2024-01-15 MED ORDER — DEXAMETHASONE SODIUM PHOSPHATE 10 MG/ML IJ SOLN
INTRAMUSCULAR | Status: AC
  Filled 2024-01-15: qty 5

## 2024-01-15 MED ORDER — SUCCINYLCHOLINE CHLORIDE 200 MG/10ML IV SOSY
PREFILLED_SYRINGE | INTRAVENOUS | Status: AC
  Filled 2024-01-15: qty 10

## 2024-01-15 MED ORDER — ACETAMINOPHEN 500 MG PO TABS
1000.0000 mg | ORAL_TABLET | Freq: Once | ORAL | Status: AC
  Administered 2024-01-15: 1000 mg via ORAL

## 2024-01-15 MED ORDER — SODIUM CHLORIDE 0.9 % IR SOLN
Status: DC | PRN
  Administered 2024-01-15: 1000 mL

## 2024-01-15 MED ORDER — DROPERIDOL 2.5 MG/ML IJ SOLN
INTRAMUSCULAR | Status: AC
  Filled 2024-01-15: qty 2

## 2024-01-15 MED ORDER — HYDRALAZINE HCL 20 MG/ML IJ SOLN
10.0000 mg | Freq: Once | INTRAMUSCULAR | Status: AC
  Administered 2024-01-15: 10 mg via INTRAVENOUS

## 2024-01-15 MED ORDER — MEPERIDINE HCL 25 MG/ML IJ SOLN
6.2500 mg | INTRAMUSCULAR | Status: DC | PRN
Start: 2024-01-15 — End: 2024-01-15

## 2024-01-15 MED ORDER — SODIUM CHLORIDE 0.9% FLUSH: 3.0000 mL | INTRAVENOUS | Status: DC | PRN

## 2024-01-15 MED ORDER — OXYMETAZOLINE HCL 0.05 % NA SOLN
NASAL | Status: AC
  Filled 2024-01-15: qty 30

## 2024-01-15 MED ORDER — ATROPINE SULFATE 0.4 MG/ML IV SOLN
INTRAVENOUS | Status: AC
  Filled 2024-01-15: qty 1

## 2024-01-15 MED ORDER — PROPOFOL 10 MG/ML IV BOLUS
INTRAVENOUS | Status: DC | PRN
  Administered 2024-01-15: 200 mg via INTRAVENOUS

## 2024-01-15 MED ORDER — ORAL CARE MOUTH RINSE: 15.0000 mL | Freq: Once | OROMUCOSAL | Status: AC

## 2024-01-15 SURGICAL SUPPLY — 28 items
ATTRACTOMAT 16X20 MAGNETIC DRP (DRAPES) IMPLANT
BAG COUNTER SPONGE SURGICOUNT (BAG) ×1 IMPLANT
BLADE INF TURB ROT M4 2 5PK (BLADE) ×1 IMPLANT
BLADE SURG 15 STRL LF DISP TIS (BLADE) IMPLANT
CANISTER SUCT 3000ML PPV (MISCELLANEOUS) ×1 IMPLANT
DRAPE HALF SHEET 40X57 (DRAPES) IMPLANT
DRSG TELFA 3X8 NADH STRL (GAUZE/BANDAGES/DRESSINGS) IMPLANT
FILTER ARTHROSCOPY CONVERTOR (FILTER) IMPLANT
GLOVE BIO SURGEON STRL SZ7 (GLOVE) ×1 IMPLANT
GOWN STRL REUS W/ TWL LRG LVL3 (GOWN DISPOSABLE) ×2 IMPLANT
KIT BASIN OR (CUSTOM PROCEDURE TRAY) ×1 IMPLANT
KIT TURNOVER KIT B (KITS) ×1 IMPLANT
NDL HYPO 25GX1X1/2 BEV (NEEDLE) IMPLANT
NDL HYPO 25X1 1.5 SAFETY (NEEDLE) ×1 IMPLANT
NEEDLE HYPO 25GX1X1/2 BEV (NEEDLE) IMPLANT
NEEDLE HYPO 25X1 1.5 SAFETY (NEEDLE) ×1 IMPLANT
NS IRRIG 1000ML POUR BTL (IV SOLUTION) ×1 IMPLANT
PAD ARMBOARD 7.5X6 YLW CONV (MISCELLANEOUS) ×2 IMPLANT
PATTIES SURGICAL .5 X3 (DISPOSABLE) ×1 IMPLANT
POSITIONER HEAD DONUT 9IN (MISCELLANEOUS) IMPLANT
SPECIMEN JAR SMALL (MISCELLANEOUS) IMPLANT
SPIKE FLUID TRANSFER (MISCELLANEOUS) ×1 IMPLANT
SPLINT NASAL DOYLE BI-VL (GAUZE/BANDAGES/DRESSINGS) ×1 IMPLANT
SUT CHROMIC 4 0 P 3 18 (SUTURE) IMPLANT
SUT CHROMIC 4 0 RB 1X27 (SUTURE) IMPLANT
TOWEL GREEN STERILE FF (TOWEL DISPOSABLE) ×1 IMPLANT
TRAY ENT MC OR (CUSTOM PROCEDURE TRAY) ×1 IMPLANT
TUBE CONNECTING 12X1/4 (SUCTIONS) ×1 IMPLANT

## 2024-01-15 NOTE — H&P (Signed)
Holly Nielsen is an 33 y.o. female.    Chief Complaint:  nasal obstruction  HPI: Patient presents today for planned elective procedure.  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   Severe persistent asthma 03/13/2022   Ureteral stone with hydronephrosis 11/23/2023   Urinary tract infection     Past Surgical History:  Procedure Laterality Date   APPENDECTOMY     CHOLECYSTECTOMY     thyroid cyst removal     TONSILLECTOMY     WISDOM TOOTH EXTRACTION      Family History  Problem Relation Age of Onset   Learning disabilities Father    Hypertension Father    Hyperlipidemia Father    Diabetes Father    Asthma Father    Hypertension Paternal Grandmother    Hyperlipidemia Paternal Grandmother    Diabetes Paternal Grandmother    Diabetes Mellitus I Paternal Grandmother    Hypertension Paternal Grandfather    Hyperlipidemia Paternal Grandfather    Diabetes Paternal Grandfather    Stroke Paternal Grandfather    Heart attack Paternal Grandfather    Leukemia Paternal Great-grandfather     Social History:  reports that she has never smoked. She has never used smokeless tobacco. She reports that she does not drink alcohol and does not use drugs.  Allergies:  Allergies  Allergen Reactions   Capsicum Anaphylaxis   Nsaids Swelling   Buspirone Diarrhea   Other Nausea And Vomiting and Diarrhea    Green bell  peppers   Celery Oil Diarrhea   Iron Sucrose Other (See Comments)     Dizziness, Pt became flushed, hot and diaphoretic after completion of infusion.      Nutricap [Actical] Diarrhea   Pedi-Pre Tape Spray [Wound Dressing Adhesive] Hives   Wound Dressings Hives   Anise Extract [Flavoring Agent (Non-Screening)] Hives and Rash    Vanilla Extract    Medications Prior to Admission  Medication Sig Dispense Refill   albuterol (VENTOLIN HFA) 108 (90 Base) MCG/ACT inhaler Inhale 2 puffs into the lungs every 6 (six) hours as needed. 18 g 1   diphenhydrAMINE (BENADRYL) 25 MG tablet Take 2 tablets (50 mg total) by mouth 4 (four) times daily for 1 day, THEN 2 tablets (50 mg total) in the morning, at noon, and at bedtime for 1 day, THEN 2 tablets (50 mg total) every 8 (eight) hours as needed (Patient taking differently: Take 25 mg by mouth once daily as needed for allergic reactions ) 60 tablet 0   EPINEPHrine (PRIMATENE MIST) 0.125 MG/ACT AERO Inhale 1 puff into the lungs daily as needed (shortness of breath).     Fluticasone-Umeclidin-Vilant (TRELEGY ELLIPTA) 200-62.5-25 MCG/ACT AEPB Inhale 1 Puff into the lungs once daily . Rinse mouth after use 60 each 3   montelukast (SINGULAIR) 10 MG tablet Take 1 tablet (10 mg total) by mouth  at bedtime. 30 tablet 0   omalizumab (XOLAIR) 300 MG/2ML injection Inject 300 mg into the skin every 14 (fourteen) days.     PARoxetine (PAXIL-CR) 25 MG 24 hr tablet Take 1 tablet (25 mg total) by mouth every morning. 30 tablet 0   albuterol (PROVENTIL) (2.5 MG/3ML) 0.083% nebulizer solution Inhale 1 vial by nebulization every 6 (six) hours as needed for wheezing or shortness of breath. 270 mL 0   EPINEPHrine 0.3 mg/0.3 mL IJ SOAJ injection Inject 0.3 mg into the muscle as needed for anaphylaxis. 2 each 5   HYDROcodone-acetaminophen (NORCO/VICODIN) 5-325 MG tablet Take 1 tablet by mouth every 4 (four) hours as needed for moderate pain (4-6). 15 tablet 0    No results found  for this or any previous visit (from the past 48 hours). No results found.  ROS: negative other than stated in HPI  Blood pressure 122/80, pulse 99, temperature 98.4 F (36.9 C), resp. rate 20, height 5\' 3"  (1.6 m), weight 127 kg, last menstrual period 01/08/2024, SpO2 96%.  PHYSICAL EXAM: General: Resting comfortably in NAD  Lungs: Non-labored respiratinos  Studies Reviewed:    Assessment/Plan Nasal obstruction - Plan septoplasty and turbinate reduction    @[facility] @ 01/15/2024, 9:30 AM

## 2024-01-15 NOTE — Op Note (Signed)
OPERATIVE NOTE  Holly Nielsen Date/Time of Admission: 01/15/2024  8:19 AM  CSN: [id number] Attending Provider: Ginger Carne, MD Room/Bed: MCPO/NONE DOB: 06/23/1991 Age: 33 y.o.   Pre-Op Diagnosis: Hypertrophy of both inferior nasal turbinates  Post-Op Diagnosis: Hypertrophy of both inferior nasal turbinates  Procedure: Procedure(s): SEPTOPLASTY BILATERAL INFERIOR TURBINATE REDUCTION  Anesthesia: General  Surgeon(s): Harland Dingwall, MD  Staff: Circulator: Ermalinda Barrios, RN Scrub Person: Carmela Rima  Implants: * No implants in log *  Specimens: * No specimens in log *  Complications: None  EBL: 30 ML  IVF: Per anesthesia  Condition: stable  Operative Findings:  Moderate left septal deviation and turbinate hypertrophy  Description of Operation: After informed consent was same the patient is brought back to the hybrid room intubated by anesthesia.  The nose was decongested with Afrin soaked cottonoids and the septum and inferior turbinates were injected with 1% lidocaine with 1 100,000 epinephrine.  A left hemitransfixion incision was made and a mucoperichondrial flap elevated.  The flap was elevated over the left mid posterior fracture line and bony spurs.  A vertical incision was made leaving an adequate dorsal strut.  The contralateral flap then raised and superior and inferior cartilage cuts were then made in the cartilaginous portion of the septum released.  Then using a Jansen-Middleton the bony parts of the superior septum were then removed.  The left posterior bony spurs were then fractured with a freer elevator and grasped Takahashi forceps and removed.  At this point the septum was relatively straight and the blood suctioned out of the nose.  The hemitransfixion incision was closed with 4-0 chromic in a quilting mattress suture was placed with 4-0 chromic suture.  The turbinates were then addressed starting with the left side  the head of the inferior turbinate was incised and a submucosal flap was then raised the bony portion of the turbinate was then outfractured and eggshell fashion and the submucosal tissue then removed with a microdebrider on the anterior half of the turbinate.  The same was performed on the right side.  Patient was then turned over the anesthesia in stable condition for transfer to PACU.

## 2024-01-15 NOTE — Anesthesia Preprocedure Evaluation (Addendum)
Anesthesia Evaluation  Patient identified by MRN, date of birth, ID band Patient awake    Reviewed: Allergy & Precautions, NPO status , Patient's Chart, lab work & pertinent test results  Airway Mallampati: IV  TM Distance: >3 FB Neck ROM: Full    Dental no notable dental hx. (+) Dental Advisory Given, Teeth Intact   Pulmonary asthma    Pulmonary exam normal breath sounds clear to auscultation       Cardiovascular negative cardio ROS Normal cardiovascular exam Rhythm:Regular Rate:Normal     Neuro/Psych  Headaches PSYCHIATRIC DISORDERS Anxiety Depression     Neuromuscular disease    GI/Hepatic negative GI ROS, Neg liver ROS,,,  Endo/Other    Class 3 obesity  Renal/GU Renal disease     Musculoskeletal negative musculoskeletal ROS (+)    Abdominal  (+) + obese  Peds  Hematology  (+) Blood dyscrasia, anemia   Anesthesia Other Findings   Reproductive/Obstetrics                             Anesthesia Physical Anesthesia Plan  ASA: 3  Anesthesia Plan: General   Post-op Pain Management: Tylenol PO (pre-op)*   Induction: Intravenous  PONV Risk Score and Plan: 4 or greater and Ondansetron, Dexamethasone, Treatment may vary due to age or medical condition and Midazolam  Airway Management Planned: Oral ETT and Video Laryngoscope Planned  Additional Equipment: None  Intra-op Plan:   Post-operative Plan: Extubation in OR  Informed Consent: I have reviewed the patients History and Physical, chart, labs and discussed the procedure including the risks, benefits and alternatives for the proposed anesthesia with the patient or authorized representative who has indicated his/her understanding and acceptance.     Dental advisory given  Plan Discussed with: CRNA  Anesthesia Plan Comments:         Anesthesia Quick Evaluation

## 2024-01-15 NOTE — Discharge Instructions (Signed)
No forceful nose blowing for 1-2 weeks. Start saline irrigations (sinus rinse) twice daily in 2 days. No heavy lifting/activity x 2 weeks

## 2024-01-15 NOTE — Anesthesia Postprocedure Evaluation (Signed)
Anesthesia Post Note  Patient: Holly Nielsen  Procedure(s) Performed: BILATERAL INFERIOR TURBINATE REDUCTION (Bilateral: Nose) SEPTOPLASTY (Bilateral: Nose)     Patient location during evaluation: PACU Anesthesia Type: General Level of consciousness: sedated and patient cooperative Pain management: pain level controlled Vital Signs Assessment: post-procedure vital signs reviewed and stable Respiratory status: spontaneous breathing Cardiovascular status: stable Anesthetic complications: no   No notable events documented.  Last Vitals:  Vitals:   01/15/24 1315 01/15/24 1330  BP: (!) 155/120 131/76  Pulse: 79 80  Resp: (!) 8 12  Temp:  37 C  SpO2: 91% 93%    Last Pain:  Vitals:   01/15/24 1330  PainSc: 0-No pain                 Lewie Loron

## 2024-01-15 NOTE — Anesthesia Procedure Notes (Signed)
Procedure Name: Intubation Date/Time: 01/15/2024 11:27 AM  Performed by: Little Ishikawa, CRNAPre-anesthesia Checklist: Patient identified, Emergency Drugs available, Suction available, Timeout performed and Patient being monitored Patient Re-evaluated:Patient Re-evaluated prior to induction Oxygen Delivery Method: Circle system utilized Preoxygenation: Pre-oxygenation with 100% oxygen Induction Type: IV induction Ventilation: Mask ventilation without difficulty Laryngoscope Size: Glidescope and 3 Grade View: Grade I Tube type: Oral Tube size: 7.0 mm Airway Equipment and Method: Stylet and Rigid stylet Placement Confirmation: ETT inserted through vocal cords under direct vision, positive ETCO2, CO2 detector and breath sounds checked- equal and bilateral Secured at: 22 cm Tube secured with: Tape Dental Injury: Teeth and Oropharynx as per pre-operative assessment

## 2024-01-15 NOTE — Transfer of Care (Signed)
Immediate Anesthesia Transfer of Care Note  Patient: Holly Nielsen  Procedure(s) Performed: BILATERAL INFERIOR TURBINATE REDUCTION (Bilateral: Nose) SEPTOPLASTY (Bilateral: Nose)  Patient Location: PACU  Anesthesia Type:General  Level of Consciousness: drowsy and patient cooperative  Airway & Oxygen Therapy: Patient Spontanous Breathing  Post-op Assessment: Report given to RN, Post -op Vital signs reviewed and stable, and Patient moving all extremities X 4  Post vital signs: Reviewed and stable  Last Vitals:  Vitals Value Taken Time  BP 154/107 01/15/24 1212  Temp    Pulse 90 01/15/24 1214  Resp 13 01/15/24 1214  SpO2 91 % 01/15/24 1214  Vitals shown include unfiled device data.  Last Pain:  Vitals:   01/15/24 0916  PainSc: 0-No pain         Complications: No notable events documented.

## 2024-01-16 ENCOUNTER — Encounter (HOSPITAL_COMMUNITY): Payer: Self-pay | Admitting: Otolaryngology

## 2024-01-18 ENCOUNTER — Other Ambulatory Visit (HOSPITAL_COMMUNITY): Payer: Self-pay

## 2024-02-02 ENCOUNTER — Encounter: Payer: Self-pay | Admitting: Dietician

## 2024-02-02 ENCOUNTER — Encounter: Payer: Medicaid Other | Attending: General Practice | Admitting: Dietician

## 2024-02-02 VITALS — Ht 62.0 in | Wt 286.6 lb

## 2024-02-02 DIAGNOSIS — Z713 Dietary counseling and surveillance: Secondary | ICD-10-CM | POA: Diagnosis not present

## 2024-02-02 DIAGNOSIS — R030 Elevated blood-pressure reading, without diagnosis of hypertension: Secondary | ICD-10-CM | POA: Insufficient documentation

## 2024-02-02 DIAGNOSIS — R7303 Prediabetes: Secondary | ICD-10-CM | POA: Insufficient documentation

## 2024-02-02 DIAGNOSIS — Z6841 Body Mass Index (BMI) 40.0 and over, adult: Secondary | ICD-10-CM | POA: Diagnosis not present

## 2024-02-02 NOTE — Progress Notes (Signed)
 Medical Nutrition Therapy  Appointment Start time:  [phone]  Appointment End time:  0940  Primary concerns today: Weight Loss  Referral diagnosis: R03.0 - Elevated blood pressure reading w/o dx HTN, R73.03 - Prediabetes, E66.01 - Morbid Obesity Preferred learning style: No preference indicated Learning readiness: Not ready   NUTRITION ASSESSMENT   Anthropometrics: Ht: 62 Wt: 286.6 lbs BMI: 52.42 kg/m2 Goal Weight: <200 lbs.  Clinical Medical Hx: Prediabetes, Obesity, IDA, Guillain-Barre Syndrome, Asthma, Anxiety/Depression, ADD Medications: EpiPen , Paxil , Singulair , Albuterol  Labs: A1c - 5.7% Notable Signs/Symptoms: N/A  Lifestyle & Dietary Hx Pt reports goal of weight loss, goal weight of under 200 lbs. Pt reports history of using portion control/activity for weight loss as part of a competition, reading labels and having that portion size and exercising regularly, successfully lost -40 lbs over 3 months. Pt reports they have been utilizing portion control again for the past couple of weeks, but not exercising like they used to. Pt reports eating away from home only once a week, shares the grocery shopping responsibilities with boyfriend, and does most of the cooking. Pt reports making a lot of convenience meals/boxed dinners, states they eat dinner around midnight because that's when boyfriend gets off work. Pt reports being mostly sedentary at the moment, currently job hunting, will walk or play with dogs a couple times a week for 20-30 minutes. Pt reports fall about 5-6 months ago resulting in L partial meniscus tear, states it's mostly healed but gives some discomfort at times.  Pt reports food allergies to bell peppers, mushroom, celery, tree nuts, carries an EpiPen , states they have went into anaphylaxis 3x since August 2024, unsure as to why.    Estimated daily fluid intake: ~160 oz Supplements: MVI Sleep: Moderate quality, wakes up tired Stress / self-care: 5/10,  Finances Current average weekly physical activity: ADLs, walks dogs 2-3 times a week for 15 minutes   24-Hr Dietary Recall First Meal: cocoa puffs, 2% milk, water Snack: none Second Meal: [employer] cheeseburger, fries, coke Snack: ham and cheese lunchable, water Third Meal: sloppy Joe, bun, water Snack: none Beverages: water   NUTRITION DIAGNOSIS  NB-1.1 Food and nutrition-related knowledge deficit As related to obesity.  As evidenced by BMI of 52.42 kg/m2, sedentary lifestyle, dietary recall high in energy dense foods.   NUTRITION INTERVENTION  Nutrition education (E-1) on the following topics:  Educated patient on the two components of energy balance: Energy in (calories), and energy out (activity). Explain the role of negative energy balance in weight loss. Discussed options with patient to achieve a negative energy balance and how to best control energy in and energy out to accommodate their lifestyle.  Handouts Provided Include  Balanced Plate  Learning Style & Readiness for Change Teaching method utilized: Visual & Auditory  Demonstrated degree of understanding via: Teach Back  Barriers to learning/adherence to lifestyle change: Convenience  Goals Established by Pt Choose sugar free Jello chocolate pudding in place of Little Debbie Cupcakes. Make it a point to take at least 5-10 minutes to get up and take a walk outside or around the house if the weather is bad. Choose reduced fat cheeses, roast turkey or chicken, and whole wheat crackers in place of lunchables! Great job eating three meals a day, about 5-6 hours apart! Begin to recognize carbohydrates, proteins, and non-starchy vegetables in your food choices! Begin to build your meals using the proportions of the Balanced Plate. First, select your carb choice(s) for the meal. Make this 25% of your meal.  Next, select your source of protein to pair with your carb choice(s). Make this another 25% of your meal. Choose LEAN  proteins as often as possible! Finally, complete your meal with a variety of non-starchy vegetables. Make this the remaining 50% of your meal. Try Steam In Bag vegetables for a fast, convenient option!   MONITORING & EVALUATION Dietary intake, weekly physical activity, and weight change in 2 months.  Next Steps  Patient is to follow up with RD.

## 2024-02-02 NOTE — Patient Instructions (Addendum)
 Choose sugar free Jello chocolate pudding in place of Little Debbie Cupcakes.  Make it a point to take at least 5-10 minutes to get up and take a walk outside or around the house if the weather is bad.  Choose reduced fat cheeses, roast turkey or chicken, and whole wheat crackers in place of lunchables!  Great job eating three meals a day, about 5-6 hours apart!  Begin to recognize carbohydrates, proteins, and non-starchy vegetables in your food choices!  Begin to build your meals using the proportions of the Balanced Plate. First, select your carb choice(s) for the meal. Make this 25% of your meal. Next, select your source of protein to pair with your carb choice(s). Make this another 25% of your meal. Choose LEAN proteins as often as possible! Finally, complete your meal with a variety of non-starchy vegetables. Make this the remaining 50% of your meal. Try Steam In Bag vegetables for a fast, convenient option!

## 2024-02-04 ENCOUNTER — Other Ambulatory Visit (HOSPITAL_COMMUNITY): Payer: Self-pay

## 2024-02-05 ENCOUNTER — Other Ambulatory Visit: Payer: Self-pay

## 2024-02-05 ENCOUNTER — Other Ambulatory Visit (HOSPITAL_COMMUNITY): Payer: Self-pay

## 2024-02-19 ENCOUNTER — Other Ambulatory Visit (HOSPITAL_BASED_OUTPATIENT_CLINIC_OR_DEPARTMENT_OTHER): Payer: Self-pay

## 2024-02-19 DIAGNOSIS — J4550 Severe persistent asthma, uncomplicated: Secondary | ICD-10-CM

## 2024-02-25 ENCOUNTER — Ambulatory Visit (HOSPITAL_BASED_OUTPATIENT_CLINIC_OR_DEPARTMENT_OTHER): Payer: Medicaid Other | Admitting: Pulmonary Disease

## 2024-02-25 ENCOUNTER — Encounter (HOSPITAL_BASED_OUTPATIENT_CLINIC_OR_DEPARTMENT_OTHER): Payer: Medicaid Other

## 2024-03-25 ENCOUNTER — Other Ambulatory Visit: Payer: Self-pay

## 2024-04-04 ENCOUNTER — Encounter (HOSPITAL_BASED_OUTPATIENT_CLINIC_OR_DEPARTMENT_OTHER): Payer: Self-pay | Admitting: Pulmonary Disease

## 2024-04-04 ENCOUNTER — Ambulatory Visit (HOSPITAL_BASED_OUTPATIENT_CLINIC_OR_DEPARTMENT_OTHER): Payer: Medicaid Other | Admitting: Pulmonary Disease

## 2024-04-04 ENCOUNTER — Other Ambulatory Visit: Payer: Self-pay

## 2024-04-04 VITALS — BP 122/74 | HR 78 | Ht 61.0 in | Wt 290.6 lb

## 2024-04-04 DIAGNOSIS — J4550 Severe persistent asthma, uncomplicated: Secondary | ICD-10-CM | POA: Diagnosis not present

## 2024-04-04 LAB — PULMONARY FUNCTION TEST
DL/VA % pred: 135 %
DL/VA: 6.36 ml/min/mmHg/L
DLCO cor % pred: 134 %
DLCO cor: 27.05 ml/min/mmHg
DLCO unc % pred: 138 %
DLCO unc: 28 ml/min/mmHg
FEF 25-75 Pre: 1.52 L/s
FEF2575-%Pred-Pre: 46 %
FEV1-%Pred-Pre: 72 %
FEV1-Pre: 2.09 L
FEV1FVC-%Pred-Pre: 86 %
FEV6-%Pred-Pre: 84 %
FEV6-Pre: 2.86 L
FEV6FVC-%Pred-Pre: 100 %
FVC-%Pred-Pre: 83 %
FVC-Pre: 2.86 L
Pre FEV1/FVC ratio: 73 %
Pre FEV6/FVC Ratio: 100 %
RV % pred: 135 %
RV: 1.7 L
TLC % pred: 105 %
TLC: 4.85 L

## 2024-04-04 MED ORDER — TRELEGY ELLIPTA 200-62.5-25 MCG/ACT IN AEPB
1.0000 | INHALATION_SPRAY | Freq: Every day | RESPIRATORY_TRACT | 8 refills | Status: AC
  Filled 2024-04-04: qty 60, 60d supply, fill #0
  Filled 2024-09-13: qty 60, 30d supply, fill #1
  Filled 2024-09-13: qty 60, 30d supply, fill #0
  Filled 2024-11-30: qty 60, 30d supply, fill #1
  Filled 2025-01-02: qty 60, 30d supply, fill #0
  Filled 2025-01-25: qty 60, 30d supply, fill #1

## 2024-04-04 NOTE — Patient Instructions (Signed)
 Full PFT performed today minus post Spirometry due to new Albuterol allergy.

## 2024-04-04 NOTE — Patient Instructions (Signed)
  Severe persistent asthma  --CONTINUE Xolair twice a month --CONTINUE Trelegy 200 ONE puff ONCE a day --CONTINUE zyrtec 10 mg daily --For exacerbations, would consider albuterol use. However due to concern for self-reported anaphylaxis, recommend going to emergency room for re-trial of albuterol in a controlled setting if asthma symptoms significantly worsen.  Osteoporosis prevention secondary to chronic steroid use --Recommend Calcium 1000-1200 mg daily and vitamin D 600-800 units daily through diet or supplements

## 2024-04-04 NOTE — Progress Notes (Signed)
 Subjective:   PATIENT ID: Holly Nielsen GENDER: female DOB: 07/21/1991, MRN: [id number]   HPI  Chief Complaint  Patient presents with   Follow-up    Asthma    Reason for Visit: Follow-up asthma  Ms. Holly Nielsen is a 33 year old female with childhood/adult asthma, morbid obesity, history of Guillain-Barr syndrome in 2017, ADD, anxiety/depression who presents for asthma.  Since September 2022 she has had 5 ED visits for viral illnesses/respiratory complaints including COVID 19 in January 2023.  She was briefly hospitalized from 3/16 to 03/14/2022 for asthma exacerbation requiring nebulizers IV steroids.  She was discharged on prednisone and Advair pulmonary referral placed.  She was seen again in the ED on 04/12/2022 after running out of nebulizer solution and Advair and having symptoms of shortness of breath and wheezing  She has moved from [facility] to [facility]. Previously on Xolair, last taken in 2021. At baseline she has shortness of breath, cough and wheezing. Symptoms triggered by allergens and illness. Able to walk and push through her symptoms. 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 Uses albuterol several times a day. She walks her dog daily 2 miles.  09/22/22 Since our last visit she has been seen in urgent care for asthma flare in June and had RSV virus earlier this month in September and treated with prednisone taper. She reports she has not been able to make Xolair appointments due to death in family and had to caregiver to grandmother. She is ready to focus on her health now. She is compliant with her Advair. Has some cough and wheezing requiring albuterol twice a day.  04/04/24 She was seen at [facility] by NP Parrettt on 11/02/23 for asthma; Trelegy and Xolair. Denies shortness of breath, cough or wheezing. Last exacerbation one month ago. Since our last visit she has completed PFTs. Did not complete post-bronchodilator portion due to concern for reported anaphylactic reaction  (swelling and itching). She was seen at [facility] ED for influenza on 02/27/24. Also had septoplasty and turbinate reduction on 01/15/24 for deviated nasal septum and this has helped with her asthma symptoms. She is currently seeing Allergy at [facility] for anaphylaxis and allergies; currently unclear triggers. Occurred during August, October, Dec 2024.   Asthma Control Test ACT Total Score  11/02/2023  2:40 PM 8  10/22/2022  1:09 PM 6  09/22/2022 12:53 PM 10   Social History: Never smoker  Past Medical History:  Diagnosis Date   Acute asthma exacerbation 10/15/2017   Acute respiratory failure with hypoxia (HCC) 11/23/2023   AKI (acute kidney injury) (HCC) 11/23/2023   Allergic reaction 07/31/2023   Amenorrhea 09/16/2021   Anaphylaxis 07/30/2023   Anxiety and depression    Asthma    Chicken pox    Cyst of thyroid 09/16/2021   Guillain-Barre (HCC)    H/O removal of thyroglossal duct cyst 12/27/2017   Headache    Iron deficiency anemia    Leucocytosis 12/04/2023   Migraine headache with aura    Pica    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   Severe persistent asthma 03/13/2022   Ureteral stone with hydronephrosis 11/23/2023   Urinary tract infection      Family History  Problem Relation Age of Onset   Learning disabilities Father    Hypertension Father    Hyperlipidemia Father    Diabetes Father    Asthma Father    Hypertension Paternal Grandmother    Hyperlipidemia Paternal Grandmother    Diabetes Paternal Grandmother    Diabetes  Mellitus I Paternal Grandmother    Hypertension Paternal Grandfather    Hyperlipidemia Paternal Grandfather    Diabetes Paternal Grandfather    Stroke Paternal Grandfather    Heart attack Paternal Grandfather    Leukemia Paternal Great-grandfather      Social History   Occupational History   Occupation: box car - Artist  Tobacco Use   Smoking status: Never   Smokeless tobacco: Never  Vaping Use   Vaping status: Never Used  Substance and  Sexual Activity   Alcohol use: Never   Drug use: Never   Sexual activity: Yes    Partners: Male    Birth control/protection: None    Allergies  Allergen Reactions   Albuterol Sulfate Er Shortness Of Breath   Capsicum Anaphylaxis   Nsaids Swelling   Buspirone Diarrhea   Other Nausea And Vomiting and Diarrhea    Green bell peppers   Celery Oil Diarrhea   Iron Sucrose Other (See Comments)     Dizziness, Pt became flushed, hot and diaphoretic after completion of infusion.      Nutricap [Actical] Diarrhea   Pedi-Pre Tape Spray [Wound Dressing Adhesive] Hives   Wound Dressings Hives   Anise Extract [Flavoring Agent (Non-Screening)] Hives and Rash    Vanilla Extract     Outpatient Medications Prior to Visit  Medication Sig Dispense Refill   EPINEPHrine (PRIMATENE MIST) 0.125 MG/ACT AERO Inhale 1 puff into the lungs daily as needed (shortness of breath).     EPINEPHrine 0.3 mg/0.3 mL IJ SOAJ injection Inject 0.3 mg into the muscle as needed for anaphylaxis. 2 each 5   omalizumab (XOLAIR) 300 MG/2ML injection Inject 300 mg into the skin every 14 (fourteen) days.     PARoxetine (PAXIL-CR) 25 MG 24 hr tablet Take 1 tablet (25 mg total) by mouth every morning. 30 tablet 0   Fluticasone-Umeclidin-Vilant (TRELEGY ELLIPTA) 200-62.5-25 MCG/ACT AEPB Inhale 1 Puff into the lungs once daily . Rinse mouth after use 60 each 3   montelukast (SINGULAIR) 10 MG tablet Take 1 tablet (10 mg total) by mouth at bedtime. 30 tablet 0   albuterol (PROVENTIL) (2.5 MG/3ML) 0.083% nebulizer solution Inhale 1 vial by nebulization every 6 (six) hours as needed for wheezing or shortness of breath. (Patient not taking: Reported on 04/04/2024) 270 mL 0   albuterol (VENTOLIN HFA) 108 (90 Base) MCG/ACT inhaler Inhale 2 puffs into the lungs every 6 (six) hours as needed. (Patient not taking: Reported on 04/04/2024) 18 g 1   diphenhydrAMINE (BENADRYL) 25 MG tablet Take 2 tablets (50 mg total) by mouth 4 (four) times daily for  1 day, THEN 2 tablets (50 mg total) in the morning, at noon, and at bedtime for 1 day, THEN 2 tablets (50 mg total) every 8 (eight) hours as needed (Patient taking differently: Take 25 mg by mouth once daily as needed for allergic reactions ) 60 tablet 0   HYDROcodone-acetaminophen (NORCO/VICODIN) 5-325 MG tablet Take 1 tablet by mouth every 4 (four) hours as needed for moderate pain (4-6). (Patient not taking: Reported on 04/04/2024) 15 tablet 0   No facility-administered medications prior to visit.    Review of Systems  Constitutional:  Negative for chills, diaphoresis, fever, malaise/fatigue and weight loss.  HENT:  Negative for congestion.   Respiratory:  Positive for cough, shortness of breath and wheezing. Negative for hemoptysis and sputum production.   Cardiovascular:  Negative for chest pain, palpitations and leg swelling.     Objective:   Vitals:  04/04/24 1329  BP: 122/74  Pulse: 78  SpO2: 98%  Weight: 290 lb 9.6 oz (131.8 kg)  Height: 5\' 1"  (1.549 m)    SpO2: 98 %  Physical Exam: General: Well-appearing, no acute distress HENT: [place], AT Eyes: EOMI, no scleral icterus Respiratory: Clear to auscultation bilaterally.  No crackles, wheezing or rales Cardiovascular: RRR, -M/R/G, no JVD Extremities:-Edema,-tenderness Neuro: AAO x4, CNII-XII grossly intact Psych: Normal mood, normal affect  Data Reviewed:  Imaging: CXR 04/12/2022-no acute infiltrate  PFT: 04/04/24 FVC 2.86 (83%) FEV1 2.09 (72%) Ratio 73  TLC 105% RV 135% DLCO 138% Interpretation: Mild obstructive defect with mild air trapping and mildly increased gas exchange consistent with asthma  Labs: CBC    Component Value Date/Time   WBC 6.7 01/15/2024 0952   RBC 4.89 01/15/2024 0952   HGB 12.8 01/15/2024 0952   HCT 40.0 01/15/2024 0952   PLT 205 01/15/2024 0952   MCV 81.8 01/15/2024 0952   MCH 26.2 01/15/2024 0952   MCHC 32.0 01/15/2024 0952   RDW 14.3 01/15/2024 0952   LYMPHSABS 2.8 12/04/2023 1131    MONOABS 0.6 12/04/2023 1131   EOSABS 0.2 12/04/2023 1131   BASOSABS 0.0 12/04/2023 1131   Absolute eos 03/13/22 - 400     Assessment & Plan:   Discussion: 33 year old female with childhood/adult asthma, hx Guillain Barre syndrome in 2017, ADD, anxiety/depression who presents for follow-up. Reviewed hospital history and prior clinic notes. Not in exacerbation. Well controlled on Xolair and high dose triple therapy.   Severe persistent asthma  --Reviewed PFTs. Mild obstructive defect.  --CONTINUE Xolair twice a month. Managed by [facility]  --CONTINUE Trelegy 200 ONE puff ONCE a day --CONTINUE zyrtec 10 mg daily --Obtain labs: CBC with diff and IgE --For exacerbations, would consider albuterol use. However due to concern for self-reported anaphylaxis, recommend going to emergency room for re-trial of albuterol in a controlled setting if asthma symptoms significantly worsen.  Osteoporosis prevention secondary to chronic steroid use --Recommend Calcium 1000-1200 mg daily and vitamin D 600-800 units daily through diet or supplements  Anaphylaxis --Followed by Allergy at [facility] Maintenance Immunization History  Administered Date(s) Administered   Influenza Split 09/25/2011   MMR 11/04/2019    Orders Placed This Encounter  Procedures   IgE   CBC with Differential   Meds ordered this encounter  Medications   Fluticasone-Umeclidin-Vilant (TRELEGY ELLIPTA) 200-62.5-25 MCG/ACT AEPB    Sig: Inhale 1 Puff into the lungs once daily . Rinse mouth after use    Dispense:  60 each    Refill:  8    Return in about 7 months (around 11/04/2024).  I have spent a total time of 40-minutes on the day of the appointment including chart review, data review, collecting history, coordinating care and discussing medical diagnosis and plan with the patient/family. Past medical history, allergies, medications were reviewed. Pertinent imaging, labs and tests included in this note have been reviewed  and interpreted independently by me.  Rami Waddle Genetta Kenning, MD [facility] 04/04/2024 1:56 PM

## 2024-04-04 NOTE — Progress Notes (Signed)
 Full PFT performed today minus post Spirometry due to new Albuterol allergy.

## 2024-04-05 ENCOUNTER — Ambulatory Visit: Payer: Medicaid Other | Admitting: Dietician

## 2024-04-11 ENCOUNTER — Telehealth: Payer: Self-pay

## 2024-04-11 LAB — CBC WITH DIFFERENTIAL/PLATELET
Basophils Absolute: 0 10*3/uL (ref 0.0–0.2)
Basos: 1 %
EOS (ABSOLUTE): 0.3 10*3/uL (ref 0.0–0.4)
Eos: 4 %
Hematocrit: 37.8 % (ref 34.0–46.6)
Hemoglobin: 12.1 g/dL (ref 11.1–15.9)
Immature Grans (Abs): 0 10*3/uL (ref 0.0–0.1)
Immature Granulocytes: 1 %
Lymphocytes Absolute: 1.9 10*3/uL (ref 0.7–3.1)
Lymphs: 27 %
MCH: 25.7 pg — ABNORMAL LOW (ref 26.6–33.0)
MCHC: 32 g/dL (ref 31.5–35.7)
MCV: 80 fL (ref 79–97)
Monocytes Absolute: 0.4 10*3/uL (ref 0.1–0.9)
Monocytes: 5 %
Neutrophils Absolute: 4.4 10*3/uL (ref 1.4–7.0)
Neutrophils: 62 %
Platelets: 259 10*3/uL (ref 150–450)
RBC: 4.71 x10E6/uL (ref 3.77–5.28)
RDW: 14.1 % (ref 11.7–15.4)
WBC: 7 10*3/uL (ref 3.4–10.8)

## 2024-04-11 LAB — IGE: IgE (Immunoglobulin E), Serum: 621 [IU]/mL — ABNORMAL HIGH (ref 6–495)

## 2024-04-11 NOTE — Telephone Encounter (Signed)
 Copied from CRM [id number]. Topic: Clinical - Lab/Test Results >> Apr 11, 2024  2:21 PM Holly Nielsen wrote: Reason for CRM: Patient would like to get more information on the lab results from her blood work. She would like for the nurse to call her.

## 2024-04-11 NOTE — Telephone Encounter (Signed)
 [facility] Telephone Encounter  Updated patient regarding absolute eosinophils (300) and IgE (621) despite Xolair usage and episodes of anaphylaxis of unknown etiology.  Discussed results with patient and suspect this is related to her asthma. Xolair is currently being prescribed by [facility]. Advised patient to contact [facility] and consider transitioning to alternative biologic for her asthma.  Patient appreciated conversation and will follow-up with tertiary care center.

## 2024-04-11 NOTE — Telephone Encounter (Signed)
 Copied from CRM [id number]. Topic: Clinical - Lab/Test Results >> Apr 11, 2024 12:30 PM Crist Dominion wrote: Reason for CRM: Patient is requesting to speak with Dr. Jacqualyn Mates nurse as she is concerned about her most recent blood test results.  Called and spoke to pt. She reviewed lab via MyChart and she is concerned about IGE level.   Dr. Washington Hacker, please advise. Thanks

## 2024-04-11 NOTE — Telephone Encounter (Signed)
 Called and spoke with patient and scheduled appt for Wednesday to discuss further

## 2024-04-12 ENCOUNTER — Ambulatory Visit: Admitting: Dietician

## 2024-04-13 ENCOUNTER — Encounter: Payer: Self-pay | Admitting: General Practice

## 2024-04-13 ENCOUNTER — Other Ambulatory Visit: Payer: Self-pay

## 2024-04-13 ENCOUNTER — Ambulatory Visit (INDEPENDENT_AMBULATORY_CARE_PROVIDER_SITE_OTHER): Admitting: General Practice

## 2024-04-13 VITALS — BP 122/100 | HR 92 | Temp 97.6°F | Ht 61.0 in | Wt 289.0 lb

## 2024-04-13 DIAGNOSIS — R030 Elevated blood-pressure reading, without diagnosis of hypertension: Secondary | ICD-10-CM

## 2024-04-13 DIAGNOSIS — R5383 Other fatigue: Secondary | ICD-10-CM | POA: Insufficient documentation

## 2024-04-13 DIAGNOSIS — Z862 Personal history of diseases of the blood and blood-forming organs and certain disorders involving the immune mechanism: Secondary | ICD-10-CM

## 2024-04-13 DIAGNOSIS — N898 Other specified noninflammatory disorders of vagina: Secondary | ICD-10-CM | POA: Insufficient documentation

## 2024-04-13 LAB — POC URINALSYSI DIPSTICK (AUTOMATED)
Bilirubin, UA: NEGATIVE
Blood, UA: NEGATIVE
Glucose, UA: NEGATIVE
Ketones, UA: NEGATIVE
Nitrite, UA: NEGATIVE
Protein, UA: NEGATIVE
Spec Grav, UA: 1.02 (ref 1.010–1.025)
Urobilinogen, UA: 0.2 U/dL
pH, UA: 5.5 (ref 5.0–8.0)

## 2024-04-13 LAB — IBC + FERRITIN
Ferritin: 42.1 ng/mL (ref 10.0–291.0)
Iron: 47 ug/dL (ref 42–145)
Saturation Ratios: 14.1 % — ABNORMAL LOW (ref 20.0–50.0)
TIBC: 333.2 ug/dL (ref 250.0–450.0)
Transferrin: 238 mg/dL (ref 212.0–360.0)

## 2024-04-13 LAB — VITAMIN D 25 HYDROXY (VIT D DEFICIENCY, FRACTURES): VITD: 15.2 ng/mL — ABNORMAL LOW (ref 30.00–100.00)

## 2024-04-13 LAB — VITAMIN B12: Vitamin B-12: 314 pg/mL (ref 211–911)

## 2024-04-13 NOTE — Assessment & Plan Note (Addendum)
 POC UA shows 1+ leuks, no nitrites or blood  Urine culture pending. Self swab-wet prep pending.  Discussed treatment options with patient.  Await results.

## 2024-04-13 NOTE — Assessment & Plan Note (Signed)
 Initial blood pressure reading elevated today.  Discussed to continue limiting salt, healthy diet and exercise she has been following with healthy weight and wellness and has been monitoring her diet.

## 2024-04-13 NOTE — Assessment & Plan Note (Addendum)
 Reviewed CBC from 04/04/2024.  Iron and ferritin levels pending.

## 2024-04-13 NOTE — Progress Notes (Signed)
 Established Patient Office Visit  Subjective   Patient ID: Holly Nielsen, female    DOB: 25-Jan-1991  Age: 33 y.o. MRN: [id number]  Chief Complaint  Patient presents with   Labs Only    Discuss labs she would like done and allergy to albuterol    HPI  Holly Nielsen is a 33 year old female with past medical history of IDA  Vaginal itching: symptom onset today. Denies dysuria, flank pain, urgency or frequency. She states that she just got off of her period and not sure if its an allergic reaction or yeast infection or cystitis.  She denies any vaginal discharge or any new sexual partners.  Fatigue:  She would like to have her ferritin levels checked. She does not take iron but has had iv iron infusions in the past.  Last infusion was three years ago.  She suspects that her ferritin levels were low.  CBC checked on 04/04/24 shows hemoglobin of 12.1. She denies any dizziness, cold intolerance. She had her TSH level and hemoglobin A1c checked in December.    Patient Active Problem List   Diagnosis Date Noted   Vaginal itching 04/13/2024   Fatigue 04/13/2024   Acute viral syndrome 12/24/2023   Prediabetes 12/18/2023   Infertility counseling 12/17/2023   Elevated blood pressure reading without diagnosis of hypertension 12/04/2023   Mild persistent asthma without complication 12/04/2023   Morbid obesity with BMI of 50.0-59.9, adult (HCC) 09/16/2021   Anxiety and depression 09/16/2021   History of iron deficiency anemia 09/16/2021   ADD (attention deficit disorder) 02/26/2018   Guillain-Barre syndrome (HCC) 09/28/2017   Eczema 02/04/2013   Past Medical History:  Diagnosis Date   Acute asthma exacerbation 10/15/2017   Acute respiratory failure with hypoxia (HCC) 11/23/2023   AKI (acute kidney injury) (HCC) 11/23/2023   Allergic reaction 07/31/2023   Amenorrhea 09/16/2021   Anaphylaxis 07/30/2023   Anxiety and depression    Asthma    Chicken pox    Cyst of thyroid 09/16/2021    Guillain-Barre (HCC)    H/O removal of thyroglossal duct cyst 12/27/2017   Headache    Iron deficiency anemia    Leucocytosis 12/04/2023   Migraine headache with aura    Pica    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   Severe persistent asthma 03/13/2022   Ureteral stone with hydronephrosis 11/23/2023   Urinary tract infection    Past Surgical History:  Procedure Laterality Date   APPENDECTOMY     CHOLECYSTECTOMY     SEPTOPLASTY Bilateral 01/15/2024   Procedure: SEPTOPLASTY;  Surgeon: Ginger Carne, MD;  Location[facility] OR;  Service: ENT;  Laterality: Bilateral;   thyroid cyst removal     TONSILLECTOMY     TURBINATE REDUCTION Bilateral 01/15/2024   Procedure: BILATERAL INFERIOR TURBINATE REDUCTION;  Surgeon: Ginger Carne, MD;  Location: [facility] OR;  Service: ENT;  Laterality: Bilateral;   WISDOM TOOTH EXTRACTION     Allergies  Allergen Reactions   Albuterol Sulfate Er Shortness Of Breath   Capsicum Anaphylaxis   Nsaids Swelling   Buspirone Diarrhea   Other Nausea And Vomiting and Diarrhea    Green bell peppers   Celery Oil Diarrhea   Iron Sucrose Other (See Comments)     Dizziness, Pt became flushed, hot and diaphoretic after completion of infusion.      Nutricap [Actical] Diarrhea   Pedi-Pre Tape Spray [Wound Dressing Adhesive] Hives   Wound Dressings Hives   Anise Extract [Flavoring Agent (Non-Screening)] Hives and Rash  Vanilla Extract         04/13/2024   11:58 AM 12/04/2023   11:34 AM  Depression screen PHQ 2/9  Decreased Interest 0 1  Down, Depressed, Hopeless 1 2  PHQ - 2 Score 1 3  Altered sleeping 1 3  Tired, decreased energy 2 2  Change in appetite 0 2  Feeling bad or failure about yourself  2 2  Trouble concentrating 0 1  Moving slowly or fidgety/restless 0 0  Suicidal thoughts 0 0  PHQ-9 Score 6 13  Difficult doing work/chores Somewhat difficult Not difficult at all       04/13/2024   11:58 AM 12/04/2023   11:34 AM  GAD 7 : Generalized Anxiety  Score  Nervous, Anxious, on Edge 1 1  Control/stop worrying 0 1  Worry too much - different things 1 2  Trouble relaxing 1 1  Restless 0 0  Easily annoyed or irritable 0 0  Afraid - awful might happen 1 0  Total GAD 7 Score 4 5  Anxiety Difficulty Somewhat difficult Not difficult at all      Review of Systems  Constitutional:  Positive for malaise/fatigue. Negative for chills and fever.  Respiratory:  Negative for shortness of breath.   Cardiovascular:  Negative for chest pain.  Gastrointestinal:  Negative for abdominal pain, constipation, diarrhea, heartburn, nausea and vomiting.  Genitourinary:  Negative for dysuria, frequency and urgency.  Neurological:  Negative for dizziness and headaches.  Endo/Heme/Allergies:  Negative for polydipsia.  Psychiatric/Behavioral:  Negative for depression and suicidal ideas. The patient is not nervous/anxious.       Objective:     BP (!) 122/100 (BP Location: Left Arm, Patient Position: Sitting, Cuff Size: Large)   Pulse 92   Temp 97.6 F (36.4 C) (Oral)   Ht 5\' 1"  (1.549 m)   Wt 289 lb (131.1 kg)   SpO2 94%   BMI 54.61 kg/m  BP Readings from Last 3 Encounters:  04/13/24 (!) 122/100  04/04/24 122/74  01/15/24 131/76   Wt Readings from Last 3 Encounters:  04/13/24 289 lb (131.1 kg)  04/04/24 290 lb 9.6 oz (131.8 kg)  04/04/24 290 lb 9.6 oz (131.8 kg)      Physical Exam Vitals and nursing note reviewed.  Constitutional:      Appearance: Normal appearance.  Cardiovascular:     Rate and Rhythm: Normal rate and regular rhythm.     Pulses: Normal pulses.     Heart sounds: Normal heart sounds.  Pulmonary:     Effort: Pulmonary effort is normal.     Breath sounds: Normal breath sounds.  Musculoskeletal:     Right lower leg: No edema.     Left lower leg: No edema.  Neurological:     Mental Status: She is alert and oriented to person, place, and time.  Psychiatric:        Mood and Affect: Mood normal.        Behavior:  Behavior normal.        Thought Content: Thought content normal.        Judgment: Judgment normal.      Results for orders placed or performed in visit on 04/13/24  POCT Urinalysis Dipstick (Automated)  Result Value Ref Range   Color, UA yellow    Clarity, UA clear    Glucose, UA Negative Negative   Bilirubin, UA neg    Ketones, UA neg    Spec Grav, UA 1.020 1.010 - 1.025  Blood, UA neg    pH, UA 5.5 5.0 - 8.0   Protein, UA Negative Negative   Urobilinogen, UA 0.2 0.2 or 1.0 E.U./dL   Nitrite, UA neg    Leukocytes, UA Small (1+) (A) Negative       The ASCVD Risk score (Arnett DK, et al., 2019) failed to calculate for the following reasons:   The 2019 ASCVD risk score is only valid for ages 47 to 67    Assessment & Plan:  History of iron deficiency anemia Assessment & Plan: Reviewed CBC from 04/04/2024.  Iron and ferritin levels pending.  Orders: -     IBC + Ferritin  Vaginal itching Assessment & Plan: POC UA shows 1+ leuks, no nitrites or blood  Urine culture pending. Self swab-wet prep pending.  Discussed treatment options with patient.  Await results.  Orders: -     WET PREP BY MOLECULAR PROBE -     POCT Urinalysis Dipstick (Automated) -     Urine Culture  Elevated blood pressure reading without diagnosis of hypertension Assessment & Plan: Initial blood pressure reading elevated today.  Discussed to continue limiting salt, healthy diet and exercise she has been following with healthy weight and wellness and has been monitoring her diet.   Fatigue, unspecified type Assessment & Plan: Unclear etiology.  Will check labs to rule out vitamin deficiency.  Reviewed labs from December in Care Everywhere, TSH was within normal range.  She was found to be prediabetic.  Suspect this could be related to low ferritin levels. Await results.  Orders: -     VITAMIN D 25 Hydroxy (Vit-D Deficiency, Fractures) -     Vitamin B12 -     IBC + Ferritin      Return if symptoms worsen or fail to improve.    Jolanda Nation, NP

## 2024-04-13 NOTE — Patient Instructions (Addendum)
 Stop by the lab prior to leaving today. I will notify you of your results once received.   Schedule follow up with allergist.   I will see you in June for your physical.   It was a pleasure to see you today!

## 2024-04-13 NOTE — Assessment & Plan Note (Signed)
 Unclear etiology.  Will check labs to rule out vitamin deficiency.  Reviewed labs from December in Care Everywhere, TSH was within normal range.  She was found to be prediabetic.  Suspect this could be related to low ferritin levels. Await results.

## 2024-04-14 ENCOUNTER — Other Ambulatory Visit: Payer: Self-pay | Admitting: General Practice

## 2024-04-14 ENCOUNTER — Other Ambulatory Visit: Payer: Self-pay

## 2024-04-14 ENCOUNTER — Encounter: Payer: Self-pay | Admitting: General Practice

## 2024-04-14 DIAGNOSIS — B9689 Other specified bacterial agents as the cause of diseases classified elsewhere: Secondary | ICD-10-CM | POA: Insufficient documentation

## 2024-04-14 DIAGNOSIS — T50905A Adverse effect of unspecified drugs, medicaments and biological substances, initial encounter: Secondary | ICD-10-CM | POA: Diagnosis not present

## 2024-04-14 DIAGNOSIS — J4550 Severe persistent asthma, uncomplicated: Secondary | ICD-10-CM | POA: Diagnosis not present

## 2024-04-14 DIAGNOSIS — A5901 Trichomonal vulvovaginitis: Secondary | ICD-10-CM | POA: Insufficient documentation

## 2024-04-14 DIAGNOSIS — E559 Vitamin D deficiency, unspecified: Secondary | ICD-10-CM

## 2024-04-14 MED ORDER — METRONIDAZOLE 500 MG PO TABS
500.0000 mg | ORAL_TABLET | Freq: Two times a day (BID) | ORAL | 0 refills | Status: AC
  Filled 2024-04-14: qty 14, 7d supply, fill #0

## 2024-04-14 MED ORDER — VITAMIN D (ERGOCALCIFEROL) 1.25 MG (50000 UNIT) PO CAPS
50000.0000 [IU] | ORAL_CAPSULE | ORAL | 0 refills | Status: DC
  Filled 2024-04-14 – 2024-09-13 (×2): qty 12, 84d supply, fill #0

## 2024-04-14 MED ORDER — LEVALBUTEROL TARTRATE 45 MCG/ACT IN AERO
2.0000 | INHALATION_SPRAY | RESPIRATORY_TRACT | 2 refills | Status: DC | PRN
  Filled 2024-04-14 – 2024-05-20 (×2): qty 15, 17d supply, fill #0

## 2024-04-15 ENCOUNTER — Other Ambulatory Visit: Payer: Self-pay

## 2024-04-15 LAB — URINE CULTURE
MICRO NUMBER:: 16337538
SPECIMEN QUALITY:: ADEQUATE

## 2024-04-15 LAB — WET PREP BY MOLECULAR PROBE
Candida species: NOT DETECTED
MICRO NUMBER:: 16337537
SPECIMEN QUALITY:: ADEQUATE
Trichomonas vaginosis: DETECTED — AB

## 2024-04-18 ENCOUNTER — Other Ambulatory Visit: Payer: Self-pay | Admitting: General Practice

## 2024-04-18 ENCOUNTER — Other Ambulatory Visit: Payer: Self-pay

## 2024-04-18 DIAGNOSIS — N390 Urinary tract infection, site not specified: Secondary | ICD-10-CM

## 2024-04-18 MED ORDER — AMOXICILLIN 500 MG PO CAPS
500.0000 mg | ORAL_CAPSULE | Freq: Two times a day (BID) | ORAL | 0 refills | Status: AC
  Filled 2024-04-18: qty 14, 7d supply, fill #0

## 2024-04-19 ENCOUNTER — Other Ambulatory Visit: Payer: Self-pay

## 2024-04-26 ENCOUNTER — Other Ambulatory Visit: Payer: Self-pay

## 2024-04-27 ENCOUNTER — Other Ambulatory Visit: Payer: Self-pay

## 2024-05-10 ENCOUNTER — Other Ambulatory Visit: Payer: Self-pay

## 2024-05-10 ENCOUNTER — Other Ambulatory Visit (HOSPITAL_BASED_OUTPATIENT_CLINIC_OR_DEPARTMENT_OTHER): Payer: Self-pay

## 2024-05-10 MED ORDER — ALBUTEROL SULFATE (2.5 MG/3ML) 0.083% IN NEBU
3.0000 mL | INHALATION_SOLUTION | Freq: Four times a day (QID) | RESPIRATORY_TRACT | 0 refills | Status: DC | PRN
  Filled 2024-05-10 – 2024-05-20 (×2): qty 90, 8d supply, fill #0

## 2024-05-19 ENCOUNTER — Other Ambulatory Visit: Payer: Self-pay

## 2024-05-20 ENCOUNTER — Other Ambulatory Visit: Payer: Self-pay

## 2024-05-31 ENCOUNTER — Other Ambulatory Visit: Payer: Self-pay

## 2024-05-31 ENCOUNTER — Encounter (HOSPITAL_COMMUNITY): Payer: Self-pay

## 2024-05-31 ENCOUNTER — Emergency Department (HOSPITAL_COMMUNITY)
Admission: EM | Admit: 2024-05-31 | Discharge: 2024-05-31 | Disposition: A | Discharge status: Home / Self Care | Attending: Emergency Medicine | Admitting: Emergency Medicine

## 2024-05-31 DIAGNOSIS — T7840XA Allergy, unspecified, initial encounter: Secondary | ICD-10-CM | POA: Diagnosis not present

## 2024-05-31 DIAGNOSIS — I10 Essential (primary) hypertension: Secondary | ICD-10-CM | POA: Diagnosis not present

## 2024-05-31 DIAGNOSIS — R609 Edema, unspecified: Secondary | ICD-10-CM | POA: Diagnosis not present

## 2024-05-31 DIAGNOSIS — R21 Rash and other nonspecific skin eruption: Secondary | ICD-10-CM | POA: Diagnosis present

## 2024-05-31 DIAGNOSIS — R062 Wheezing: Secondary | ICD-10-CM | POA: Diagnosis not present

## 2024-05-31 DIAGNOSIS — T782XXA Anaphylactic shock, unspecified, initial encounter: Secondary | ICD-10-CM | POA: Diagnosis not present

## 2024-05-31 LAB — CBC WITH DIFFERENTIAL/PLATELET
Abs Immature Granulocytes: 0.03 10*3/uL (ref 0.00–0.07)
Basophils Absolute: 0 10*3/uL (ref 0.0–0.1)
Basophils Relative: 0 %
Eosinophils Absolute: 0.6 10*3/uL — ABNORMAL HIGH (ref 0.0–0.5)
Eosinophils Relative: 8 %
HCT: 35.9 % — ABNORMAL LOW (ref 36.0–46.0)
Hemoglobin: 11.2 g/dL — ABNORMAL LOW (ref 12.0–15.0)
Immature Granulocytes: 0 %
Lymphocytes Relative: 23 %
Lymphs Abs: 1.7 10*3/uL (ref 0.7–4.0)
MCH: 24.9 pg — ABNORMAL LOW (ref 26.0–34.0)
MCHC: 31.2 g/dL (ref 30.0–36.0)
MCV: 79.8 fL — ABNORMAL LOW (ref 80.0–100.0)
Monocytes Absolute: 0.5 10*3/uL (ref 0.1–1.0)
Monocytes Relative: 7 %
Neutro Abs: 4.5 10*3/uL (ref 1.7–7.7)
Neutrophils Relative %: 62 %
Platelets: 264 10*3/uL (ref 150–400)
RBC: 4.5 MIL/uL (ref 3.87–5.11)
RDW: 15.1 % (ref 11.5–15.5)
WBC: 7.2 10*3/uL (ref 4.0–10.5)
nRBC: 0 % (ref 0.0–0.2)

## 2024-05-31 LAB — BASIC METABOLIC PANEL WITH GFR
Anion gap: 9 (ref 5–15)
BUN: 12 mg/dL (ref 6–20)
CO2: 24 mmol/L (ref 22–32)
Calcium: 8.3 mg/dL — ABNORMAL LOW (ref 8.9–10.3)
Chloride: 102 mmol/L (ref 98–111)
Creatinine, Ser: 0.98 mg/dL (ref 0.44–1.00)
GFR, Estimated: >60 mL/min (ref >60)
Glucose, Bld: 141 mg/dL — ABNORMAL HIGH (ref 70–99)
Potassium: 3.5 mmol/L (ref 3.5–5.1)
Sodium: 135 mmol/L (ref 135–145)

## 2024-05-31 LAB — HCG, SERUM, QUALITATIVE: Preg, Serum: NEGATIVE

## 2024-05-31 MED ORDER — DIPHENHYDRAMINE HCL 25 MG PO CAPS
25.0000 mg | ORAL_CAPSULE | Freq: Once | ORAL | Status: AC
  Administered 2024-05-31: 25 mg via ORAL
  Filled 2024-05-31: qty 1

## 2024-05-31 MED ORDER — DIPHENHYDRAMINE HCL 25 MG PO TABS
25.0000 mg | ORAL_TABLET | Freq: Three times a day (TID) | ORAL | 0 refills | Status: DC
  Filled 2024-05-31: qty 10, 4d supply, fill #0

## 2024-05-31 MED ORDER — PREDNISONE 20 MG PO TABS
40.0000 mg | ORAL_TABLET | Freq: Every day | ORAL | 0 refills | Status: DC
  Filled 2024-05-31: qty 8, 4d supply, fill #0

## 2024-05-31 MED ORDER — ALBUTEROL SULFATE (2.5 MG/3ML) 0.083% IN NEBU
10.0000 mg/h | INHALATION_SOLUTION | RESPIRATORY_TRACT | Status: AC
  Administered 2024-05-31: 10 mg/h via RESPIRATORY_TRACT
  Filled 2024-05-31: qty 3

## 2024-05-31 MED ORDER — PREDNISONE 20 MG PO TABS
60.0000 mg | ORAL_TABLET | Freq: Once | ORAL | Status: AC
  Administered 2024-05-31: 60 mg via ORAL
  Filled 2024-05-31: qty 3

## 2024-05-31 MED ORDER — FAMOTIDINE 20 MG PO TABS
40.0000 mg | ORAL_TABLET | Freq: Once | ORAL | Status: DC
  Filled 2024-05-31: qty 2

## 2024-05-31 MED ORDER — FAMOTIDINE 20 MG PO TABS
20.0000 mg | ORAL_TABLET | Freq: Two times a day (BID) | ORAL | 0 refills | Status: DC
  Filled 2024-05-31: qty 10, 5d supply, fill #0

## 2024-05-31 NOTE — ED Provider Notes (Signed)
 9:35 PM Care of the patient assumed at signout.  Now after hours of monitoring she has had no decompensation has improved markedly, has no increased work of breathing will be discharged in stable condition.   Dorenda Gandy, MD 05/31/24 2135

## 2024-05-31 NOTE — Discharge Instructions (Addendum)
 Please be sure to obtain and take your medication as prescribed.  Return here for concerning changes in your condition otherwise follow-up with your physician.

## 2024-05-31 NOTE — ED Notes (Signed)
Respiratory called for continuous nebulizer

## 2024-05-31 NOTE — ED Notes (Signed)
 Patient did not want the pepcid . She said last time she had it she felt like she had a weird reaction. She was worried trying it again she would react.

## 2024-05-31 NOTE — ED Triage Notes (Signed)
 Patient BIB [facility] from work. Started noticing swelling to the left side of her face, into her left eye and left neck at 1:30pm. Denies eating new things, new soaps or makeup. Wheezing on arrival. Hospitalized 1 week in November for allergic reaction.   EMS 0.3mg  Epi IM left deltoid at 2:19pm.

## 2024-05-31 NOTE — ED Provider Notes (Signed)
 [place] EMERGENCY DEPARTMENT AT [facility] Provider Note   CSN: [id number] Arrival date & time: 05/31/24  1439     History  Chief Complaint  Patient presents with   Allergic Reaction    Holly Nielsen is a 33 y.o. female.  33 yo F with a chief complaints of skin rash difficulty breathing feeling like her face is swollen.  Patient thinks maybe it was due to her granola bar that she ate.  She has had a granola bar before without issue.  Has had allergic reactions in the past that required hospitalization. Started about an hour and half ago.  Tried benadryl  at home.  Was given epinephrine  with EMS.   Allergic Reaction      Home Medications Prior to Admission medications   Medication Sig Start Date End Date Taking? Authorizing Provider  albuterol  (PROVENTIL ) (2.5 MG/3ML) 0.083% nebulizer solution Take 3 mLs by nebulization every 6 (six) hours as needed for wheezing. Contact Doctor for more refills. 05/10/24   Cobb, Mariah Shines, NP  diphenhydrAMINE  (BENADRYL ) 25 MG tablet Take 2 tablets (50 mg total) by mouth 4 (four) times daily for 1 day, THEN 2 tablets (50 mg total) in the morning, at noon, and at bedtime for 1 day, THEN 2 tablets (50 mg total) every 8 (eight) hours as needed Patient taking differently: Take 25 mg by mouth once daily as needed for allergic reactions  11/27/23 04/13/24  Kraig Peru, MD  EPINEPHrine  (PRIMATENE  MIST) 0.125 MG/ACT AERO Inhale 1 puff into the lungs daily as needed (shortness of breath).    [provider]  EPINEPHrine  0.3 mg/0.3 mL IJ SOAJ injection Inject 0.3 mg into the muscle as needed for anaphylaxis. 08/14/23   Quillian Brunt, MD  Fluticasone -Umeclidin-Vilant (TRELEGY ELLIPTA ) 200-62.5-25 MCG/ACT AEPB Inhale 1 puff into the lungs daily. Rinse mouth after use. 04/04/24   Quillian Brunt, MD  levalbuterol  (XOPENEX  HFA) 45 MCG/ACT inhaler Inhale 2 puffs into the lungs every 4 (four) hours as needed. 04/14/24     PARoxetine   (PAXIL -CR) 25 MG 24 hr tablet Take 1 tablet (25 mg total) by mouth every morning. 11/29/23   Kraig Peru, MD  Vitamin D , Ergocalciferol , (DRISDOL ) 1.25 MG (50000 UNIT) CAPS capsule Take 1 capsule (50,000 Units total) by mouth every 7 (seven) days. 04/14/24   Jolanda Nation, NP      Allergies    Albuterol  sulfate er, Capsicum, Nsaids, Buspirone, Other, Celery oil, Iron sucrose, Nutricap [actical], Pedi-pre tape spray [wound dressing adhesive], Wound dressings, and Anise extract [flavoring agent (non-screening)]    Review of Systems   Review of Systems  Physical Exam Updated Vital Signs BP (!) 162/109   Pulse 86   Temp 98.7 F (37.1 C) (Oral)   Resp (!) 22   Ht 5' (1.524 m)   Wt 122.5 kg   SpO2 100%   BMI 52.73 kg/m  Physical Exam Vitals and nursing note reviewed.  Constitutional:      General: She is not in acute distress.    Appearance: She is well-developed. She is not diaphoretic.  HENT:     Head: Normocephalic and atraumatic.  Eyes:     Pupils: Pupils are equal, round, and reactive to light.  Cardiovascular:     Rate and Rhythm: Normal rate and regular rhythm.     Heart sounds: No murmur heard.    No friction rub. No gallop.  Pulmonary:     Effort: Pulmonary effort is normal.  Breath sounds: Wheezing present. No rales.  Abdominal:     General: There is no distension.     Palpations: Abdomen is soft.     Tenderness: There is no abdominal tenderness.  Musculoskeletal:        General: No tenderness.     Cervical back: Normal range of motion and neck supple.  Skin:    General: Skin is warm and dry.     Comments: Diffuse erythematous rash.   Neurological:     Mental Status: She is alert and oriented to person, place, and time.  Psychiatric:        Behavior: Behavior normal.     ED Results / Procedures / Treatments   Labs (all labs ordered are listed, but only abnormal results are displayed) Labs Reviewed  CBC WITH DIFFERENTIAL/PLATELET - Abnormal; Notable  for the following components:      Result Value   Hemoglobin 11.2 (*)    HCT 35.9 (*)    MCV 79.8 (*)    MCH 24.9 (*)    Eosinophils Absolute 0.6 (*)    All other components within normal limits  BASIC METABOLIC PANEL WITH GFR  HCG, SERUM, QUALITATIVE    EKG None  Radiology No results found.  Procedures .Critical Care  Performed by: Albertus Hughs, DO Authorized by: Albertus Hughs, DO   Critical care provider statement:    Critical care time (minutes):  35   Critical care time was exclusive of:  Separately billable procedures and treating other patients   Critical care was time spent personally by me on the following activities:  Development of treatment plan with patient or surrogate, discussions with consultants, evaluation of patient's response to treatment, examination of patient, ordering and review of laboratory studies, ordering and review of radiographic studies, ordering and performing treatments and interventions, pulse oximetry, re-evaluation of patient's condition and review of old charts   Care discussed with: admitting provider       Medications Ordered in ED Medications  albuterol  (PROVENTIL ) (2.5 MG/3ML) 0.083% nebulizer solution (10 mg/hr Nebulization New Bag/Given 05/31/24 1505)  famotidine  (PEPCID ) tablet 40 mg (40 mg Oral Patient Refused/Not Given 05/31/24 1500)  predniSONE  (DELTASONE ) tablet 60 mg (60 mg Oral Given 05/31/24 1500)  diphenhydrAMINE  (BENADRYL ) capsule 25 mg (25 mg Oral Given 05/31/24 1500)    ED Course/ Medical Decision Making/ A&P                                 Medical Decision Making Amount and/or Complexity of Data Reviewed Labs: ordered.  Risk Prescription drug management.   33 yo F with a cc of facial swelling rash and difficulty breathing.  Patient has a history of an allergic reaction that brought her into the hospital.  She ate a granola bar is the only possible trigger that she had.  She has had this granola bars before but without  issue.  She does have a diffuse erythematous rash.  She has some faint end expiratory wheezes.  Will place on a continuous neb.  Steroids antihistamines.  Observe in the ED.  Patient reassessed and feels weird.  She does look objectively better.  Continues to have very faint wheezes and expiratory.  Will continue to observe in the emergency department.  Patient care signed out to Dr. Liam Redhead, please see their note for further details of care in the ED.  The patients results and plan were reviewed and discussed.  Any x-rays performed were independently reviewed by myself.   Differential diagnosis were considered with the presenting HPI.  Medications  albuterol  (PROVENTIL ) (2.5 MG/3ML) 0.083% nebulizer solution (10 mg/hr Nebulization New Bag/Given 05/31/24 1505)  famotidine  (PEPCID ) tablet 40 mg (40 mg Oral Patient Refused/Not Given 05/31/24 1500)  predniSONE  (DELTASONE ) tablet 60 mg (60 mg Oral Given 05/31/24 1500)  diphenhydrAMINE  (BENADRYL ) capsule 25 mg (25 mg Oral Given 05/31/24 1500)    Vitals:   05/31/24 1448 05/31/24 1449 05/31/24 1505  BP:  (!) 162/109   Pulse:  86   Resp:  (!) 22   Temp:  98.7 F (37.1 C)   TempSrc:  Oral   SpO2:  99% 100%  Weight: 122.5 kg    Height: 5' (1.524 m)      Final diagnoses:  Allergic reaction, initial encounter            Final Clinical Impression(s) / ED Diagnoses Final diagnoses:  Allergic reaction, initial encounter    Rx / DC Orders ED Discharge Orders     None         Albertus Hughs, DO 05/31/24 1559

## 2024-06-01 ENCOUNTER — Other Ambulatory Visit: Payer: Self-pay

## 2024-06-03 ENCOUNTER — Encounter: Payer: Self-pay | Admitting: General Practice

## 2024-06-03 ENCOUNTER — Ambulatory Visit (INDEPENDENT_AMBULATORY_CARE_PROVIDER_SITE_OTHER): Admitting: General Practice

## 2024-06-03 ENCOUNTER — Other Ambulatory Visit: Payer: Self-pay

## 2024-06-03 VITALS — BP 124/88 | HR 86 | Temp 98.1°F | Ht 60.0 in | Wt 293.0 lb

## 2024-06-03 DIAGNOSIS — D509 Iron deficiency anemia, unspecified: Secondary | ICD-10-CM | POA: Diagnosis not present

## 2024-06-03 DIAGNOSIS — Z09 Encounter for follow-up examination after completed treatment for conditions other than malignant neoplasm: Secondary | ICD-10-CM | POA: Diagnosis not present

## 2024-06-03 DIAGNOSIS — T782XXD Anaphylactic shock, unspecified, subsequent encounter: Secondary | ICD-10-CM

## 2024-06-03 NOTE — Assessment & Plan Note (Signed)
 Overall doing better.  Reviewed ER notes and labs at length.

## 2024-06-03 NOTE — Assessment & Plan Note (Signed)
 Followed by allergist.   Reviewed ER notes and labs.   Continue zyrtec , benadryl , Singulair , Xolair  injections per allergist recommendations.   Continue epi pen as needed.

## 2024-06-03 NOTE — Patient Instructions (Addendum)
 Stop by the lab prior to leaving today. I will notify you of your results once received.   I will place the referral once I get the results.   It was a pleasure to see you today!

## 2024-06-03 NOTE — Assessment & Plan Note (Signed)
 Chronic.  Unable to tolerate PO iron.   Reviewed CBC from 05/31/24.  Iron studies pending.  Consider hematology referral for IV iron infusions.

## 2024-06-03 NOTE — Progress Notes (Signed)
 Established Patient Office Visit  Subjective   Patient ID: Holly Nielsen, female    DOB: [birth year]  Age: 33 y.o. MRN: [id number]  Chief Complaint  Patient presents with   Hospitalization Follow-up    Allergic reaction to granola bar. Pt has gone into anaphylaxis 4 times since August. Pt complains of feeling tired and drained.    lab work    Pt would like to discuss recent blood work that she is concerned about.     HPI  Holly Nielsen is a 33 year old female with past medical history of angioedema, Guillain-Barr syndrome, asthma, eczema, ADD, anxiety and depression, fatigue, prediabetes presents today for an acute visit.  Allergic reaction: Patient was evaluated at the emergency room on 05/31/2024.  She reports having allergic reaction to granola bar and went into anaphylaxis.  She tried Benadryl  at home and called EMS who administered epinephrine .  She was monitored for several hours was discharged.  She was prescribed Benadryl , famotidine  20 mg twice daily for 2 days and prednisone  40 mg for 4 days.  She is established with an allergist at [facility].  She reports having 4 anaphylaxis reactions since August.  She is feeling very tired and drained.  Her last appointment with the allergist was in May. She did not take her famotidine  due to her hx of allergic reaction; she is also done with prednisone .   IDA: chronic. Her hemoglobin on 05/31/24 was 11.2.  her iron studies in April showed iron level of 47 and ferritin was 42.1. she reports allergy to PO iron. She has had IV iron in the past at [facility] over three years ago. She had to monitored closely but did not have a reaction while receiving IV iron. She has been feeling fatigued and drained lately; no energy. She reports starting a new job recently and is not a tiring position but she just crashes when she gets home. Denies any abnormal bleeding, chest pain, shortness of breath or difficulty breathing.   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   Morbid obesity with BMI of 50.0-59.9, adult (HCC) 09/16/2021   Anxiety and depression 09/16/2021   History of iron deficiency anemia 09/16/2021   ADD (attention deficit disorder) 02/26/2018   Guillain-Barre syndrome (HCC) 09/28/2017   Eczema 02/04/2013   Past Medical History:  Diagnosis Date   Acute asthma exacerbation 10/15/2017   Acute respiratory failure with hypoxia (HCC) 11/23/2023   AKI (acute kidney injury) (HCC) 11/23/2023   Allergic reaction 07/31/2023   Amenorrhea 09/16/2021   Anaphylaxis 07/30/2023   Anxiety and depression    Asthma    Chicken pox    Cyst of thyroid  09/16/2021   Guillain-Barre (HCC)    H/O removal of thyroglossal duct cyst 12/27/2017   Headache    Iron deficiency anemia    Leucocytosis 12/04/2023   Migraine headache with aura    Pica    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   Severe persistent asthma 03/13/2022   Ureteral stone with hydronephrosis 11/23/2023   Urinary tract infection    Past Surgical History:  Procedure  Laterality Date   APPENDECTOMY     CHOLECYSTECTOMY     SEPTOPLASTY Bilateral 01/15/2024   Procedure: SEPTOPLASTY;  Surgeon: Everardo Hitch, MD;  Location: [facility] OR;  Service: ENT;  Laterality: Bilateral;   thyroid  cyst removal     TONSILLECTOMY     TURBINATE REDUCTION Bilateral 01/15/2024   Procedure: BILATERAL INFERIOR TURBINATE REDUCTION;  Surgeon: Everardo Hitch, MD;  Location: [facility] OR;  Service: ENT;  Laterality: Bilateral;   WISDOM TOOTH EXTRACTION      Allergies  Allergen Reactions   Albuterol  Sulfate Er Shortness Of Breath   Capsicum Anaphylaxis   Nsaids Swelling   Pepcid  [Famotidine ] Anaphylaxis   Buspirone Diarrhea   Other Nausea And Vomiting and Diarrhea    Green bell peppers   Celery Oil Diarrhea   Iron Sucrose Other (See Comments)     Dizziness, Pt became flushed, hot and diaphoretic after completion of infusion.      Nutricap [Actical] Diarrhea   Pedi-Pre Tape Spray [Wound Dressing Adhesive] Hives   Wound Dressings Hives   Anise Extract [Flavoring Agent (Non-Screening)] Hives and Rash    Vanilla Extract         06/03/2024    2:10 PM 04/13/2024   11:58 AM 12/04/2023   11:34 AM  Depression screen PHQ 2/9  Decreased Interest 0 0 1  Down, Depressed, Hopeless 0 1 2  PHQ - 2 Score 0 1 3  Altered sleeping 1 1 3   Tired, decreased energy 3 2 2   Change in appetite 0 0 2  Feeling bad or failure about yourself  0 2 2  Trouble concentrating 0 0 1  Moving slowly or fidgety/restless 0 0 0  Suicidal thoughts 0 0 0  PHQ-9 Score 4 6 13   Difficult doing work/chores Not difficult at all Somewhat difficult Not difficult at all       06/03/2024    2:10 PM 04/13/2024   11:58 AM 12/04/2023   11:34 AM  GAD 7 : Generalized Anxiety Score  Nervous, Anxious, on Edge 0 1 1  Control/stop worrying 0 0 1  Worry too much - different things 0 1 2  Trouble relaxing 0 1 1  Restless 0 0 0  Easily annoyed or irritable 0 0 0  Afraid - awful might happen 0 1 0  Total GAD 7 Score 0 4 5  Anxiety Difficulty Not difficult at all Somewhat difficult Not difficult at all      Review of Systems  Constitutional:  Positive for malaise/fatigue. Negative for chills and fever.  Respiratory:  Negative for shortness of breath.   Cardiovascular:  Negative for chest pain.  Gastrointestinal:  Negative for abdominal pain, constipation, diarrhea, heartburn, nausea and vomiting.  Genitourinary:  Negative for dysuria, frequency and urgency.  Neurological:   Negative for dizziness and headaches.  Endo/Heme/Allergies:  Negative for polydipsia.  Psychiatric/Behavioral:  Negative for depression and suicidal ideas. The patient is not nervous/anxious.       Objective:     BP 124/88   Pulse 86   Temp 98.1 F (36.7 C) (Oral)   Ht 5' (1.524 m)   Wt 293 lb (132.9 kg)   SpO2 96%   BMI 57.22 kg/m  BP Readings from Last 3 Encounters:  06/03/24 124/88  05/31/24 (!) 139/91  04/13/24 (!) 122/100   Wt Readings from Last 3 Encounters:  06/03/24 293 lb (132.9 kg)  05/31/24 270 lb (122.5 kg)  04/13/24 289 lb (131.1 kg)      Physical Exam  Vitals and nursing note reviewed.  Constitutional:      Appearance: Normal appearance.  Cardiovascular:     Rate and Rhythm: Normal rate and regular rhythm.     Pulses: Normal pulses.     Heart sounds: Normal heart sounds.  Pulmonary:     Effort: Pulmonary effort is normal.     Breath sounds: Normal breath sounds.  Neurological:     Mental Status: She is alert and oriented to person, place, and time.  Psychiatric:        Mood and Affect: Mood normal.        Behavior: Behavior normal.        Thought Content: Thought content normal.        Judgment: Judgment normal.      No results found for any visits on 06/03/24.     The ASCVD Risk score (Arnett DK, et al., 2019) failed to calculate for the following reasons:   The 2019 ASCVD risk score is only valid for ages 50 to 8    Assessment & Plan:  Hospital discharge follow-up Assessment & Plan: Overall doing better.  Reviewed ER notes and labs at length.   Iron deficiency anemia, unspecified iron deficiency anemia type Assessment & Plan: Chronic.  Unable to tolerate PO iron.   Reviewed CBC from 05/31/24.  Iron studies pending.  Consider hematology referral for IV iron infusions.  Orders: -     Iron, TIBC and Ferritin Panel  Anaphylaxis, subsequent encounter Assessment & Plan: Followed by allergist.   Reviewed ER notes and labs.    Continue zyrtec , benadryl , Singulair , Xolair  injections per allergist recommendations.   Continue epi pen as needed.      Return in about 2 weeks (around 06/17/2024) for physical .    Jolanda Nation, NP

## 2024-06-04 LAB — IRON,TIBC AND FERRITIN PANEL
%SAT: 9 % — ABNORMAL LOW (ref 16–45)
Ferritin: 77 ng/mL (ref 16–154)
Iron: 28 ug/dL — ABNORMAL LOW (ref 40–190)
TIBC: 316 ug/dL (ref 250–450)

## 2024-06-06 ENCOUNTER — Ambulatory Visit: Payer: Self-pay | Admitting: General Practice

## 2024-06-06 DIAGNOSIS — Z862 Personal history of diseases of the blood and blood-forming organs and certain disorders involving the immune mechanism: Secondary | ICD-10-CM

## 2024-06-10 ENCOUNTER — Encounter: Payer: Self-pay | Admitting: Oncology

## 2024-06-10 ENCOUNTER — Other Ambulatory Visit: Payer: Self-pay

## 2024-06-10 ENCOUNTER — Inpatient Hospital Stay: Attending: Oncology | Admitting: Oncology

## 2024-06-10 ENCOUNTER — Inpatient Hospital Stay

## 2024-06-10 VITALS — BP 142/78 | HR 85 | Temp 98.1°F | Resp 18 | Ht 60.0 in | Wt 297.0 lb

## 2024-06-10 DIAGNOSIS — D509 Iron deficiency anemia, unspecified: Secondary | ICD-10-CM | POA: Diagnosis present

## 2024-06-10 NOTE — Progress Notes (Unsigned)
 Patient has been feeling very fatigued and weak. She has had the iron infusions before and they helped her a lot.

## 2024-06-12 ENCOUNTER — Encounter: Payer: Self-pay | Admitting: Oncology

## 2024-06-12 NOTE — Progress Notes (Signed)
 Hematology/Oncology Consult note [facility] [phone] [phone]  Patient Care Team: Jolanda Nation, NP as PCP - General (General Practice)   Name of the patient: Holly Nielsen  [id number]  [birth year]    Reason for referral-iron deficiency anemia   Referring physician- Jolanda Nation NP  Date of visit: 06/12/24   History of presenting illness- patient is a 33 year old female with a past medical history significant for anxiety and depression, history of eczema referred for iron deficiency anemia.  Labs from June 2025 showed an H&H of 11.2/35.9 with an MCV of 79.8.  White count and platelets were normal.  Her baseline hemoglobin runs between 12-13.5.  Iron studies showed a ferritin of 77 with an iron saturation of 9%.  Patient reports that her menstrual cycles last for 6 to 7 days but are not particularly heavy other than the first couple of days.  Denies any consistent use of NSAIDs.  Denies any blood loss in her stool or urine.  Denies any family history of colon cancer.  She has received IV iron in the past but states that she had a reaction to Venofer.  She experienced flushing and nausea  ECOG PS- 0  Pain scale- 0   Review of systems- Review of Systems  Constitutional:  Positive for malaise/fatigue. Negative for chills, fever and weight loss.  HENT:  Negative for congestion, ear discharge and nosebleeds.   Eyes:  Negative for blurred vision.  Respiratory:  Negative for cough, hemoptysis, sputum production, shortness of breath and wheezing.   Cardiovascular:  Negative for chest pain, palpitations, orthopnea and claudication.  Gastrointestinal:  Negative for abdominal pain, blood in stool, constipation, diarrhea, heartburn, melena, nausea and vomiting.  Genitourinary:  Negative for dysuria, flank pain, frequency, hematuria and urgency.  Musculoskeletal:  Negative for back pain, joint pain and myalgias.  Skin:  Negative for rash.   Neurological:  Negative for dizziness, tingling, focal weakness, seizures, weakness and headaches.  Endo/Heme/Allergies:  Does not bruise/bleed easily.  Psychiatric/Behavioral:  Negative for depression and suicidal ideas. The patient does not have insomnia.     Allergies  Allergen Reactions   Albuterol  Sulfate Er Shortness Of Breath   Capsicum Anaphylaxis   Nsaids Swelling   Pepcid  [Famotidine ] Anaphylaxis   Buspirone Diarrhea   Other Nausea And Vomiting and Diarrhea    Green bell peppers   Celery Oil Diarrhea   Iron Sucrose Other (See Comments)     Dizziness, Pt became flushed, hot and diaphoretic after completion of infusion.      Nutricap [Actical] Diarrhea   Pedi-Pre Tape Spray [Wound Dressing Adhesive] Hives   Wound Dressings Hives   Anise Extract [Flavoring Agent (Non-Screening)] Hives and Rash    Vanilla Extract    Patient Active Problem List   Diagnosis Date Noted   Hospital discharge follow-up 06/03/2024   Iron deficiency anemia 06/03/2024   Gardnerella vaginalis infection 04/14/2024   Trichomonas vaginitis 04/14/2024   Vaginal itching 04/13/2024   Fatigue 04/13/2024   Acute viral syndrome 12/24/2023   Prediabetes 12/18/2023   Infertility counseling 12/17/2023   Elevated blood pressure reading without diagnosis of hypertension 12/04/2023   Mild persistent asthma without complication 12/04/2023   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   Morbid obesity with BMI of 50.0-59.9, adult (HCC) 09/16/2021   Anxiety and depression 09/16/2021   History of iron deficiency anemia 09/16/2021   ADD (attention deficit disorder) 02/26/2018   Guillain-Barre syndrome (HCC) 09/28/2017   Eczema 02/04/2013     Past Medical  History:  Diagnosis Date   Acute asthma exacerbation 10/15/2017   Acute respiratory failure with hypoxia (HCC) 11/23/2023   AKI (acute kidney injury) (HCC) 11/23/2023   Allergic reaction 07/31/2023   Allergy    Amenorrhea 09/16/2021   Anaphylaxis 07/30/2023    Anxiety and depression    Asthma    Chicken pox    Cyst of thyroid  09/16/2021   GERD (gastroesophageal reflux disease)    Guillain-Barre (HCC)    H/O removal of thyroglossal duct cyst 12/27/2017   Headache    Iron deficiency anemia    Leucocytosis 12/04/2023   Migraine headache with aura    Pica    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   Severe persistent asthma 03/13/2022   Ureteral stone with hydronephrosis 11/23/2023   Urinary tract infection      Past Surgical History:  Procedure Laterality Date   APPENDECTOMY     CHOLECYSTECTOMY     SEPTOPLASTY Bilateral 01/15/2024   Procedure: SEPTOPLASTY;  Surgeon: Everardo Hitch, MD;  Location: [facility] OR;  Service: ENT;  Laterality: Bilateral;   thyroid  cyst removal     TONSILLECTOMY     TURBINATE REDUCTION Bilateral 01/15/2024   Procedure: BILATERAL INFERIOR TURBINATE REDUCTION;  Surgeon: Everardo Hitch, MD;  Location: [facility] OR;  Service: ENT;  Laterality: Bilateral;   WISDOM TOOTH EXTRACTION      Social History   Socioeconomic History   Marital status: Single    Spouse name: Not on file   Number of children: 0   Years of education: Not on file   Highest education level: Associate degree: occupational, Scientist, product/process development, or vocational program  Occupational History   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    Social Drivers of Corporate investment banker Strain: Low Risk  (06/02/2024)   Overall Financial Resource Strain (CARDIA)    Difficulty of Paying Living Expenses: Not very hard  Food Insecurity: No Food Insecurity (06/10/2024)   Hunger Vital Sign    Worried About Running Out of Food in the Last  Year: Never true    Ran Out of Food in the Last Year: Never true  Transportation Needs: No Transportation Needs (06/10/2024)   PRAPARE - Administrator, Civil Service (Medical): No    Lack of Transportation (Non-Medical): No  Physical Activity: Insufficiently Active (06/02/2024)   Exercise Vital Sign    Days of Exercise per Week: 5 days    Minutes of Exercise per Session: 20 min  Stress: No Stress Concern Present (06/02/2024)   [employer] of Occupational Health - Occupational Stress Questionnaire    Feeling of Stress : Not at all  Social Connections: Moderately Isolated (06/02/2024)   Social Connection and Isolation Panel    Frequency of Communication with Friends and Family: Twice a week    Frequency of Social Gatherings with Friends and Family: Twice a week    Attends Religious Services: Never    Database administrator or Organizations: No    Attends Engineer, structural: Not on file    Marital Status: Living with partner  Intimate  Partner Violence: Not At Risk (06/10/2024)   Humiliation, Afraid, Rape, and Kick questionnaire    Fear of Current or Ex-Partner: No    Emotionally Abused: No    Physically Abused: No    Sexually Abused: No     Family History  Problem Relation Age of Onset   Learning disabilities Father    Hypertension Father    Hyperlipidemia Father    Diabetes Father    Asthma Father    Hypertension Paternal Grandmother    Hyperlipidemia Paternal Grandmother    Diabetes Paternal Grandmother    Diabetes Mellitus I Paternal Grandmother    Hypertension Paternal Grandfather    Hyperlipidemia Paternal Grandfather    Diabetes Paternal Grandfather    Stroke Paternal Grandfather    Heart attack Paternal Grandfather    Leukemia Paternal Great-grandfather      Current Outpatient Medications:    albuterol  (PROVENTIL ) (2.5 MG/3ML) 0.083% nebulizer solution, Take 3 mLs by nebulization every 6 (six) hours as needed for wheezing. Contact Doctor for  more refills., Disp: 90 mL, Rfl: 0   diphenhydrAMINE  (BENADRYL ) 25 MG tablet, Take 2 tablets (50 mg total) by mouth 4 (four) times daily for 1 day, THEN 2 tablets (50 mg total) in the morning, at noon, and at bedtime for 1 day, THEN 2 tablets (50 mg total) every 8 (eight) hours as needed, Disp: 60 tablet, Rfl: 0   EPINEPHrine  (PRIMATENE  MIST) 0.125 MG/ACT AERO, Inhale 1 puff into the lungs daily as needed (shortness of breath)., Disp: , Rfl:    EPINEPHrine  0.3 mg/0.3 mL IJ SOAJ injection, Inject 0.3 mg into the muscle as needed for anaphylaxis., Disp: 2 each, Rfl: 5   Fluticasone -Umeclidin-Vilant (TRELEGY ELLIPTA ) 200-62.5-25 MCG/ACT AEPB, Inhale 1 puff into the lungs daily. Rinse mouth after use., Disp: 60 each, Rfl: 8   levalbuterol  (XOPENEX  HFA) 45 MCG/ACT inhaler, Inhale 2 puffs into the lungs every 4 (four) hours as needed., Disp: 15 g, Rfl: 2   PARoxetine  (PAXIL -CR) 25 MG 24 hr tablet, Take 1 tablet (25 mg total) by mouth every morning., Disp: 30 tablet, Rfl: 0   predniSONE  (DELTASONE ) 20 MG tablet, Take 2 tablets (40 mg total) by mouth daily with breakfast. For the next four days, Disp: 8 tablet, Rfl: 0   Vitamin D , Ergocalciferol , (DRISDOL ) 1.25 MG (50000 UNIT) CAPS capsule, Take 1 capsule (50,000 Units total) by mouth every 7 (seven) days., Disp: 12 capsule, Rfl: 0   diphenhydrAMINE  (BENADRYL ) 25 MG tablet, Take 1 tablet (25 mg total) by mouth 3 (three) times daily. Take one tablet three times daily for two days, Disp: 10 tablet, Rfl: 0   Physical exam:  Vitals:   06/10/24 1433 06/10/24 1451  BP: (!) 152/100 (!) 142/78  Pulse: 85   Resp: 18   Temp: 98.1 F (36.7 C)   TempSrc: Tympanic   SpO2: 98%   Weight: 297 lb (134.7 kg)   Height: 5' (1.524 m)    Physical Exam  Cardiovascular:     Rate and Rhythm: Normal rate and regular rhythm.     Heart sounds: Normal heart sounds.  Pulmonary:     Effort: Pulmonary effort is normal.     Breath sounds: Normal breath sounds.  Abdominal:      General: Bowel sounds are normal.     Palpations: Abdomen is soft.   Skin:    General: Skin is warm and dry.   Neurological:     Mental Status: She is alert and oriented to person, place,  and time.           Latest Ref Rng & Units 05/31/2024    3:20 PM  CMP  Glucose 70 - 99 mg/dL 161   BUN 6 - 20 mg/dL 12   Creatinine 0.96 - 1.00 mg/dL 0.45   Sodium 409 - 811 mmol/L 135   Potassium 3.5 - 5.1 mmol/L 3.5   Chloride 98 - 111 mmol/L 102   CO2 22 - 32 mmol/L 24   Calcium 8.9 - 10.3 mg/dL 8.3       Latest Ref Rng & Units 05/31/2024    3:20 PM  CBC  WBC 4.0 - 10.5 K/uL 7.2   Hemoglobin 12.0 - 15.0 g/dL 91.4   Hematocrit 78.2 - 46.0 % 35.9   Platelets 150 - 400 K/uL 264      Assessment and plan- Patient is a 33 y.o. female for iron deficiency anemia  Patient has evidence of mild anemia given that her hemoglobin is 11.2.  Given that her iron saturation is low at 9% it would be worthwhile to do a trial of IV iron.  Patient has reacted to Venofer before and therefore I will proceed with 1 dose of Monoferric at this time and I will plan to give her 40 mg of IV Solu-Medrol  as a premedication.  Discussed risks and benefits of IV iron including all but not limited to possible risk of allergic and anaphylactic reaction.  Patient understands And agrees to proceed as planned.  CBC ferritin and iron studies in 2 and 4 months and I will see her back in 4 months and check her B12 levels at that time.  If iron deficiency anemia continues to be a problem I will refer her to GI as well in the future   Thank you for this kind referral and the opportunity to participate in the care of this patient   Visit Diagnosis 1. Iron deficiency anemia, unspecified iron deficiency anemia type     Dr. Seretha Dance, MD, MPH [facility] [phone] 06/12/2024

## 2024-06-17 ENCOUNTER — Inpatient Hospital Stay

## 2024-06-17 ENCOUNTER — Encounter: Payer: Medicaid Other | Admitting: General Practice

## 2024-06-17 VITALS — BP 142/95 | HR 85 | Temp 97.1°F | Resp 18

## 2024-06-17 DIAGNOSIS — D509 Iron deficiency anemia, unspecified: Secondary | ICD-10-CM | POA: Diagnosis not present

## 2024-06-17 DIAGNOSIS — D508 Other iron deficiency anemias: Secondary | ICD-10-CM

## 2024-06-17 MED ORDER — METHYLPREDNISOLONE SODIUM SUCC 125 MG IJ SOLR
40.0000 mg | Freq: Once | INTRAMUSCULAR | Status: AC
  Administered 2024-06-17: 40 mg via INTRAVENOUS
  Filled 2024-06-17: qty 2

## 2024-06-17 MED ORDER — SODIUM CHLORIDE 0.9 % IV SOLN
Freq: Once | INTRAVENOUS | Status: AC
  Filled 2024-06-17: qty 250

## 2024-06-17 MED ORDER — SODIUM CHLORIDE 0.9 % IV SOLN
1000.0000 mg | Freq: Once | INTRAVENOUS | Status: AC
  Administered 2024-06-17: 1000 mg via INTRAVENOUS
  Filled 2024-06-17: qty 1000

## 2024-06-17 MED ORDER — DIPHENHYDRAMINE HCL 50 MG/ML IJ SOLN
25.0000 mg | Freq: Once | INTRAMUSCULAR | Status: AC
  Administered 2024-06-17: 25 mg via INTRAVENOUS
  Filled 2024-06-17: qty 1

## 2024-07-13 ENCOUNTER — Other Ambulatory Visit: Payer: Self-pay

## 2024-07-13 ENCOUNTER — Emergency Department (HOSPITAL_COMMUNITY)
Admission: EM | Admit: 2024-07-13 | Discharge: 2024-07-13 | Disposition: A | Discharge status: Home / Self Care | Attending: Emergency Medicine | Admitting: Emergency Medicine

## 2024-07-13 ENCOUNTER — Encounter (HOSPITAL_COMMUNITY): Payer: Self-pay | Admitting: Emergency Medicine

## 2024-07-13 DIAGNOSIS — R519 Headache, unspecified: Secondary | ICD-10-CM | POA: Insufficient documentation

## 2024-07-13 LAB — COMPREHENSIVE METABOLIC PANEL WITH GFR
ALT: 23 U/L (ref 0–44)
AST: 20 U/L (ref 15–41)
Albumin: 3.7 g/dL (ref 3.5–5.0)
Alkaline Phosphatase: 66 U/L (ref 38–126)
Anion gap: 9 (ref 5–15)
BUN: 12 mg/dL (ref 6–20)
CO2: 24 mmol/L (ref 22–32)
Calcium: 9.2 mg/dL (ref 8.9–10.3)
Chloride: 108 mmol/L (ref 98–111)
Creatinine, Ser: 0.77 mg/dL (ref 0.44–1.00)
GFR, Estimated: >60 mL/min (ref >60)
Glucose, Bld: 149 mg/dL — ABNORMAL HIGH (ref 70–99)
Potassium: 3.7 mmol/L (ref 3.5–5.1)
Sodium: 141 mmol/L (ref 135–145)
Total Bilirubin: 0.4 mg/dL (ref 0.0–1.2)
Total Protein: 7 g/dL (ref 6.5–8.1)

## 2024-07-13 LAB — URINALYSIS, ROUTINE W REFLEX MICROSCOPIC
Bacteria, UA: NONE SEEN
Bilirubin Urine: NEGATIVE
Glucose, UA: NEGATIVE mg/dL
Hgb urine dipstick: NEGATIVE
Ketones, ur: NEGATIVE mg/dL
Nitrite: NEGATIVE
Protein, ur: NEGATIVE mg/dL
Specific Gravity, Urine: 1.023 (ref 1.005–1.030)
pH: 5 (ref 5.0–8.0)

## 2024-07-13 LAB — CBC WITH DIFFERENTIAL/PLATELET
Abs Immature Granulocytes: 0.02 K/uL (ref 0.00–0.07)
Basophils Absolute: 0 K/uL (ref 0.0–0.1)
Basophils Relative: 0 %
Eosinophils Absolute: 0.3 K/uL (ref 0.0–0.5)
Eosinophils Relative: 4 %
HCT: 38.8 % (ref 36.0–46.0)
Hemoglobin: 12.1 g/dL (ref 12.0–15.0)
Immature Granulocytes: 0 %
Lymphocytes Relative: 24 %
Lymphs Abs: 1.7 K/uL (ref 0.7–4.0)
MCH: 25.5 pg — ABNORMAL LOW (ref 26.0–34.0)
MCHC: 31.2 g/dL (ref 30.0–36.0)
MCV: 81.9 fL (ref 80.0–100.0)
Monocytes Absolute: 0.4 K/uL (ref 0.1–1.0)
Monocytes Relative: 6 %
Neutro Abs: 4.7 K/uL (ref 1.7–7.7)
Neutrophils Relative %: 66 %
Platelets: 225 K/uL (ref 150–400)
RBC: 4.74 MIL/uL (ref 3.87–5.11)
RDW: 16.6 % — ABNORMAL HIGH (ref 11.5–15.5)
WBC: 7.2 K/uL (ref 4.0–10.5)
nRBC: 0 % (ref 0.0–0.2)

## 2024-07-13 LAB — SARS CORONAVIRUS 2 BY RT PCR: SARS Coronavirus 2 by RT PCR: NEGATIVE

## 2024-07-13 MED ORDER — KETOROLAC TROMETHAMINE 30 MG/ML IJ SOLN
15.0000 mg | Freq: Once | INTRAMUSCULAR | Status: AC
Start: 2024-07-13 — End: 2024-07-13
  Administered 2024-07-13: 15 mg via INTRAVENOUS
  Filled 2024-07-13: qty 1

## 2024-07-13 MED ORDER — SODIUM CHLORIDE 0.9 % IV BOLUS
1000.0000 mL | Freq: Once | INTRAVENOUS | Status: AC
  Administered 2024-07-13: 1000 mL via INTRAVENOUS

## 2024-07-13 MED ORDER — PROCHLORPERAZINE EDISYLATE 10 MG/2ML IJ SOLN
10.0000 mg | Freq: Once | INTRAMUSCULAR | Status: AC
  Administered 2024-07-13: 10 mg via INTRAVENOUS
  Filled 2024-07-13: qty 2

## 2024-07-13 NOTE — ED Notes (Signed)
 Lab called to have urine culture added

## 2024-07-13 NOTE — ED Triage Notes (Signed)
 Patient comes in with headache that started Monday night. Body aches and fatigue started yesterday.  Light sensitivity.

## 2024-07-13 NOTE — ED Notes (Signed)
 Lunch tray given patient tolerated well

## 2024-07-13 NOTE — ED Provider Notes (Signed)
 [place] EMERGENCY DEPARTMENT AT [facility] Provider Note   CSN: [id number] Arrival date & time: 07/13/24  1026     Patient presents with: Headache   Holly Nielsen is a 33 y.o. female.   HPI Patient presents with headache.  She has multiple medical problems including prior Guillain-Barr, anemia, obesity, ADD.  For the past 2 days she has had a headache, diffuse, with photophobia, no focal weakness, no confusion.    Prior to Admission medications   Medication Sig Start Date End Date Taking? Authorizing Provider  albuterol  (PROVENTIL ) (2.5 MG/3ML) 0.083% nebulizer solution Take 3 mLs by nebulization every 6 (six) hours as needed for wheezing. Contact Doctor for more refills. 05/10/24   Cobb, Comer GAILS, NP  diphenhydrAMINE  (BENADRYL ) 25 MG tablet Take 2 tablets (50 mg total) by mouth 4 (four) times daily for 1 day, THEN 2 tablets (50 mg total) in the morning, at noon, and at bedtime for 1 day, THEN 2 tablets (50 mg total) every 8 (eight) hours as needed 11/27/23 06/10/24  Tobie Yetta HERO, MD  diphenhydrAMINE  (BENADRYL ) 25 MG tablet Take 1 tablet (25 mg total) by mouth 3 (three) times daily. Take one tablet three times daily for two days 05/31/24   Garrick Charleston, MD  EPINEPHrine  (PRIMATENE  MIST) 0.125 MG/ACT AERO Inhale 1 puff into the lungs daily as needed (shortness of breath).    [provider]  EPINEPHrine  0.3 mg/0.3 mL IJ SOAJ injection Inject 0.3 mg into the muscle as needed for anaphylaxis. 08/14/23   Kassie Acquanetta Bradley, MD  Fluticasone -Umeclidin-Vilant (TRELEGY ELLIPTA ) 200-62.5-25 MCG/ACT AEPB Inhale 1 puff into the lungs daily. Rinse mouth after use. 04/04/24   Kassie Acquanetta Bradley, MD  levalbuterol  (XOPENEX  HFA) 45 MCG/ACT inhaler Inhale 2 puffs into the lungs every 4 (four) hours as needed. 04/14/24     PARoxetine  (PAXIL -CR) 25 MG 24 hr tablet Take 1 tablet (25 mg total) by mouth every morning. 11/29/23   Tobie Yetta HERO, MD  predniSONE  (DELTASONE ) 20 MG tablet  Take 2 tablets (40 mg total) by mouth daily with breakfast. For the next four days 05/31/24   Garrick Charleston, MD  Vitamin D , Ergocalciferol , (DRISDOL ) 1.25 MG (50000 UNIT) CAPS capsule Take 1 capsule (50,000 Units total) by mouth every 7 (seven) days. 04/14/24   Vincente Shivers, NP    Allergies: Capsicum, Pepcid  [famotidine ], Buspirone, Other, Celery oil, Iron sucrose, Nutricap [actical], Pedi-pre tape spray [wound dressing adhesive], Wound dressings, and Anise extract [flavoring agent (non-screening)]    Review of Systems  Updated Vital Signs BP (!) 158/110   Pulse 87   Temp 98 F (36.7 C) (Oral)   Resp 16   Ht 5' (1.524 m)   Wt 127 kg   LMP 07/04/2024   SpO2 97%   BMI 54.68 kg/m   Physical Exam Vitals and nursing note reviewed.  Constitutional:      General: She is not in acute distress.    Appearance: She is well-developed. She is obese. She is ill-appearing.  HENT:     Head: Normocephalic and atraumatic.  Eyes:     Conjunctiva/sclera: Conjunctivae normal.  Pulmonary:     Effort: Pulmonary effort is normal. No respiratory distress.     Breath sounds: No stridor.  Abdominal:     General: There is no distension.  Skin:    General: Skin is warm and dry.  Neurological:     Mental Status: She is alert and oriented to person, place, and time.  Cranial Nerves: No cranial nerve deficit.  Psychiatric:        Mood and Affect: Mood normal.     (all labs ordered are listed, but only abnormal results are displayed) Labs Reviewed  CBC WITH DIFFERENTIAL/PLATELET - Abnormal; Notable for the following components:      Result Value   MCH 25.5 (*)    RDW 16.6 (*)    All other components within normal limits  COMPREHENSIVE METABOLIC PANEL WITH GFR - Abnormal; Notable for the following components:   Glucose, Bld 149 (*)    All other components within normal limits  URINALYSIS, ROUTINE W REFLEX MICROSCOPIC - Abnormal; Notable for the following components:   APPearance HAZY (*)     Leukocytes,Ua LARGE (*)    All other components within normal limits  SARS CORONAVIRUS 2 BY RT PCR  URINE CULTURE    EKG: None  Radiology: No results found.   Procedures   Medications Ordered in the ED  sodium chloride  0.9 % bolus 1,000 mL (1,000 mLs Intravenous New Bag/Given 07/13/24 1236)  ketorolac  (TORADOL ) 30 MG/ML injection 15 mg (15 mg Intravenous Given 07/13/24 1236)  prochlorperazine  (COMPAZINE ) injection 10 mg (10 mg Intravenous Given 07/13/24 1235)                                    Medical Decision Making Adult female multiple medical problems presents neurologically intact, mild hypertension, but with headache for 3 days with photophobia.  Patient's absence of neurocomplaints, findings reassuring, suspicion for bad headache versus migraine versus electrolyte abnormalities, infection, no evidence for meningismus, however. Cardiac 85 sinus normal pulse ox 97% room air normal  Amount and/or Complexity of Data Reviewed External Data Reviewed: notes. Labs: ordered. Decision-making details documented in ED Course.  Risk Prescription drug management. Decision regarding hospitalization. Diagnosis or treatment significantly limited by social determinants of health.   2:00 PM On repeat exam patient more comfortable though she just received the medication.  With mild persistent symptoms patient will require time for remaining medications for effect.  Labs reviewed, discussed, no notable abnormalities.  Patient with abnormal urinalysis, consistent with prior, urine culture sent.  She is afebrile, with no hypotension, tachycardia suggesting bacteremia, sepsis.  Patient will follow-up with primary care.     Final diagnoses:  Bad headache    ED Discharge Orders     None          Garrick Charleston, MD 07/13/24 1400

## 2024-07-13 NOTE — Discharge Instructions (Addendum)
 As discussed, your evaluation today has been largely reassuring.  But, it is important that you monitor your condition carefully, and do not hesitate to return to the ED if you develop new, or concerning changes in your condition.  Otherwise, please follow-up with your physician for appropriate ongoing care.  A urine culture has been sent to exclude the possibility of infection.  Abnormal results require follow-up with either your physician or the emergency department.

## 2024-07-14 LAB — URINE CULTURE

## 2024-07-18 ENCOUNTER — Telehealth: Payer: Self-pay

## 2024-07-18 DIAGNOSIS — D509 Iron deficiency anemia, unspecified: Secondary | ICD-10-CM

## 2024-07-18 NOTE — Progress Notes (Signed)
 Complex Care Management Note Care Guide Note  07/18/2024 Name: Holly Nielsen MRN: [id number] DOB: [birth year]   Complex Care Management Outreach Attempts: An unsuccessful telephone outreach was attempted today to offer the patient information about available complex care management services.  Follow Up Plan:  Additional outreach attempts will be made to offer the patient complex care management information and services.   Encounter Outcome:  Patient Request to Call Back  [facility]  [facility], [facility] Health Care Management Assistant Direct Dial: [phone]  Fax: [phone]

## 2024-07-18 NOTE — Progress Notes (Signed)
 Complex Care Management Note  Care Guide Note 07/18/2024 Name: [name] MRN: [id number] DOB: [birth year]  Holly Nielsen is a 33 y.o. year old female who sees Vincente Shivers, NP for primary care. I reached out to Laymon Minder by phone today to offer complex care management services.  [name] was given information about Complex Care Management services today including:   The Complex Care Management services include support from the care team which includes your Nurse Care Manager, Clinical Social Worker, or Pharmacist.  The Complex Care Management team is here to help remove barriers to the health concerns and goals most important to you. Complex Care Management services are voluntary, and the patient may decline or stop services at any time by request to their care team member.   Complex Care Management Consent Status: Patient agreed to services and verbal consent obtained.   Follow up plan:  Telephone appointment with complex care management team member scheduled for:  07/22/24 at 1:30 p.m.   Encounter Outcome:  Patient Scheduled  [facility]  [facility], [facility] Health Care Management Assistant Direct Dial: [phone]  Fax: [phone]

## 2024-07-22 ENCOUNTER — Telehealth: Payer: Self-pay | Admitting: *Deleted

## 2024-07-29 ENCOUNTER — Telehealth: Payer: Self-pay | Admitting: *Deleted

## 2024-07-29 ENCOUNTER — Encounter: Payer: Self-pay | Admitting: *Deleted

## 2024-07-29 NOTE — Patient Instructions (Signed)
 Holly Nielsen - I am sorry I was unable to reach you today for our scheduled appointment. I work with Vincente Shivers, NP and am calling to support your healthcare needs. Please contact me at [phone] at your earliest convenience. I look forward to speaking with you soon.   Thank you,   Olam Ku, RN, BSN [place]  [facility], [facility] RN Care Manager Direct Dial: [phone]  Fax: [phone]

## 2024-07-29 NOTE — Telephone Encounter (Unsigned)
 Copied from CRM [id number]. Topic: General - Other >> Jul 29, 2024  2:07 PM [name] wrote: Reason for CRM: Patient called in regarding a missed call from Nurse Olam Ku, wold like a callback

## 2024-08-05 ENCOUNTER — Encounter: Admitting: General Practice

## 2024-08-12 ENCOUNTER — Inpatient Hospital Stay: Attending: Oncology

## 2024-08-12 ENCOUNTER — Other Ambulatory Visit: Payer: Self-pay | Admitting: *Deleted

## 2024-08-12 DIAGNOSIS — D509 Iron deficiency anemia, unspecified: Secondary | ICD-10-CM | POA: Insufficient documentation

## 2024-08-12 LAB — IRON AND TIBC
Iron: 43 ug/dL (ref 28–170)
Saturation Ratios: 14 % (ref 10.4–31.8)
TIBC: 298 ug/dL (ref 250–450)
UIBC: 255 ug/dL

## 2024-08-12 LAB — CBC (CANCER CENTER ONLY)
HCT: 39.8 % (ref 36.0–46.0)
Hemoglobin: 12.7 g/dL (ref 12.0–15.0)
MCH: 25.8 pg — ABNORMAL LOW (ref 26.0–34.0)
MCHC: 31.9 g/dL (ref 30.0–36.0)
MCV: 80.7 fL (ref 80.0–100.0)
Platelet Count: 233 K/uL (ref 150–400)
RBC: 4.93 MIL/uL (ref 3.87–5.11)
RDW: 16.1 % — ABNORMAL HIGH (ref 11.5–15.5)
WBC Count: 7.4 K/uL (ref 4.0–10.5)
nRBC: 0 % (ref 0.0–0.2)

## 2024-08-12 LAB — FERRITIN: Ferritin: 110 ng/mL (ref 11–307)

## 2024-08-12 NOTE — Patient Instructions (Signed)
 Visit Information  Thank you for taking time to visit with me today. Please don't hesitate to contact me if I can be of assistance to you before our next scheduled appointment.   no further scheduled appointments.    Please call the care guide team at [phone] if you need to cancel or reschedule your appointment.    Please call the Suicide and Crisis Lifeline: 988 call the [place]  National Suicide Prevention Lifeline: 508-481-5243 or TTY: 650-217-9024 TTY 2535913738) to talk to a trained counselor call 1-800-273-TALK (toll free, 24 hour hotline) if you are experiencing a Mental Health or Behavioral Health Crisis or need someone to talk to.  Patient verbalizes understanding of instructions and care plan provided today and agrees to view in MyChart. Active MyChart status and patient understanding of how to access instructions and care plan via MyChart confirmed with patient.      Olam Ku, RN, BSN [place]  [facility], [facility] Health RN Care Manager Direct Dial: [phone]  Fax: [phone]

## 2024-08-12 NOTE — Patient Outreach (Addendum)
 Complex Care Management   Visit Note  08/12/2024  Name:  Holly Nielsen MRN: [id number] DOB: 23-Apr-1991  RNCM contacted patient today for a scheduled appointment (receptive to the call). RNCM educated on the purpose for today's call and verified HIPAA along with ongoing medical issues on the referral (iron deficiency) and/or any other medical conditions to manage due to her recent hospitalization and ED visit. Patient reports she has had iron deficiency since age 54 y/o and feel she is managing this well.  Patient reports history of PTSD and recent loss of her grandfather. States she is on an antidepressant and states she may been a change in her dosage. Offered a referral with [facility] LCS[facility] for counseling and to completed the PHQ 2-9 and GAD for depression and anxiety (patient declined). 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   RNCM offered to address any other issues for counseling and/or education related to SDOH or other comobilities (patient opt to declined all services) however very appreciative for initiating the follow up call today. Patient aware on how to contact [facility] for future needs or reach-out through her provider's office or MyChart for services. Case and call ended with no other needs at this time.   Olam Ku, RN, BSN [place]  [facility], [facility] RN Care Manager Direct Dial: [phone]  Fax: [phone]

## 2024-08-12 NOTE — Patient Outreach (Signed)
 Complex Care Management   Visit Note  08/12/2024  Name:  Holly Nielsen MRN: [id number] DOB: [birth year]  Situation: Referral received for Complex Care Management related to iron deficiency. I obtained verbal consent from Patient.  Visit completed with patient  on the phone  Background:   Past Medical History:  Diagnosis Date   Acute asthma exacerbation 10/15/2017   Acute respiratory failure with hypoxia (HCC) 11/23/2023   AKI (acute kidney injury) (HCC) 11/23/2023   Allergic reaction 07/31/2023   Allergy    Amenorrhea 09/16/2021   Anaphylaxis 07/30/2023   Anxiety and depression    Asthma    Chicken pox    Cyst of thyroid  09/16/2021   GERD (gastroesophageal reflux disease)    Guillain-Barre (HCC)    H/O removal of thyroglossal duct cyst 12/27/2017   Headache    Iron deficiency anemia    Leucocytosis 12/04/2023   Migraine headache with aura    Pica    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   Severe persistent asthma 03/13/2022   Ureteral stone with hydronephrosis 11/23/2023   Urinary tract infection     Assessment: Patient Reported Symptoms:  Cognitive Cognitive Status: Difficulties with attention and concentration, Able to follow simple commands, Normal speech and language skills      Neurological Neurological Review of Symptoms: Headaches (history of headaches and recent ED visit on 07/13/2024 treated and d/c home) Neurological Management Strategies: Coping strategies, Routine screening  HEENT HEENT Symptoms Reported: Not assessed      Cardiovascular Cardiovascular Symptoms Reported: Not assessed    Respiratory Respiratory Symptoms Reported: Not assesed    Endocrine Endocrine Symptoms Reported: Not assessed    Gastrointestinal Gastrointestinal Symptoms Reported: Not assessed      Genitourinary Genitourinary Symptoms Reported: Not assessed    Integumentary Integumentary Symptoms Reported: Not assessed    Musculoskeletal Musculoskelatal Symptoms Reviewed: Not assessed         Psychosocial Psychosocial Symptoms Reported: Report of significant loss, deaths, abandonment, traumatic incidents (Recent loss of her grandfather pending couneling with a psychiatrist. Patient also reports history of PTSD.) Additional Psychological Details: Declined PHQ 2-9 and GAD today   Major Change/Loss/Stressor/Fears (CP): Death of a loved one        Jun 15, 2024    2:41 PM  Depression screen PHQ 2/9  Decreased Interest 0  Down, Depressed, Hopeless 0  PHQ - 2 Score 0    There were no vitals filed for this visit.  Medications Reviewed Today   Medications were not reviewed in this encounter     Recommendation:   PCP Follow-up Continue Current Plan of Care  Follow Up Plan:   Patient has met all care management goals. Care Management case will be closed. Patient has been provided contact information should new needs arise.   Olam Ku, RN, BSN [place]  [facility], [facility] Health RN Care Manager Direct Dial: [phone]  Fax: [phone]

## 2024-08-27 NOTE — Unmapped External Note (Signed)
Patient discharged home. AVS reviewed with patient. A written copy of the AVS and discharge instructions were given to the patient. Questions sufficiently answered as needed. Patient encouraged to follow up with PCP as indicated. In the event of an emergency, patient instructed to call 911 or go to the nearest emergency room. Patient left department via ambulation.

## 2024-08-27 NOTE — ED Provider Notes (Signed)
 ------------------------------------------------------------------------------- Attestation signed by Jodelle Gravel, DO at 08/27/24 [phone] I was physically present in the treatment area and immediately available for consultation during the period of this patients care. This does not imply that I physically saw and examined the patient or participated in the medical decision making at the time of service. I was not involved in the bedside history, physical, or evaluation for this documented aspect of encounter. -------------------------------------------------------------------------------   Emergency Medicine   Name: Holly Nielsen Age and Gender: 33 y.o. female Date of Birth: 06/08/1991 MRN: [id number] PCP: No Pcp  CC: Chief Complaint  Patient presents with  . Shortness of Breath  . Wheezing    HPI: Holly Nielsen is a 33 y.o. White female who presents to the ER with reports of shortness of breath that has been ongoing since around 6:00 p.m.[name]  Patient reports that she has tried several breathing treatments at home without relief.  Patient reports that she does have a history of asthma.  Patient denies chest pain can not get a deep breath.  Patient denies fever or other complaints.  Below pertinent information reviewed with patient: History reviewed. No pertinent past medical history.  Allergies[1]  No past surgical history on file.   Social History   Socioeconomic History  . Marital status: Divorced   Social Determinants of Health   Financial Resource Strain: Low Risk  (06/02/2024)   Received from [facility]   Overall Financial Resource Strain (CARDIA)   . Difficulty of Paying Living Expenses: Not very hard  Transportation Needs: No Transportation Needs (06/10/2024)   Received from [facility] - Transportation   . In the past 12 months, has lack of transportation kept you from medical appointments or from getting medications?: No   . In the past 12 months,  has lack of transportation kept you from meetings, work, or from getting things needed for daily living?: No  Social Connections: Moderately Isolated (06/02/2024)   Received from [facility]   Social Connection and Isolation Panel [NHANES]   . Frequency of Communication with Friends and Family: Twice a week   . Frequency of Social Gatherings with Friends and Family: Twice a week   . Attends Religious Services: Never   . Active Member of Clubs or Organizations: No   . Marital Status: Living with partner  Intimate Partner Violence: Not At Risk (06/10/2024)   Received from [facility]   Humiliation, Afraid, Rape, and Kick questionnaire   . Fear of Current or Ex-Partner: No   . Emotionally Abused: No   . Physically Abused: No   . Sexually Abused: No    ROS: No other overt positive review of systems are noted other than stated in the HPI.    Objective:  ED Triage Vitals [08/27/24 0630]  BP (Non-Invasive) (!) 141/114  Heart Rate (!) 106  Respiratory Rate (!) 24  Temperature 36.2 C (97.2 F)  SpO2 92 %  Weight 125 kg (275 lb)  Height 1.524 m (5')   Filed Vitals:   08/27/24 0730 08/27/24 0745 08/27/24 0800 08/27/24 0815  BP: (!) 149/85 125/86 138/87 131/80  Pulse: 90 95 89 90  Resp: 13 14 13 16   Temp:      SpO2: 98% 98% 100% 98%    Nursing notes and vital signs reviewed.  Constitutional - No acute distress.  Alert and Active. HEENT - Normocephalic. Atraumatic. PERRL. EOMI. Conjunctiva clear. TM's pearly grey, translucent, without bulging or retraction. Oropharynx with no erythema,  lesions, or exudates. Moist mucous membranes.  Neck - Trachea midline. No stridor. No hoarseness. Cardiac - Regular rate and rhythm. No murmurs, rubs, or gallops. Intact distal pulses. Respiratory/Chest - Normal respiratory effort. Clear to auscultation bilaterally. No rales, wheezes or rhonchi. No chest tenderness. Abdomen - Normal bowel sounds. Non-tender, soft, non-distended. No rebound or guarding.   Musculoskeletal - Good AROM. No muscle or joint tenderness appreciated. No clubbing, cyanosis or edema. Skin - Warm and dry, without any rashes or other lesions. Neuro - Alert and oriented x 3. Cranial nerves II-XII are grossly intact.  Moving all extremities symmetrically. Normal gait. Psych - Normal mood and affect. Behavior is normal     Any pertinent labs and imaging obtained during this encounter reviewed below in MDM. No results found for this visit on 08/27/24 (from the past 720 hours).   MDM/ED Course: Patient  presents to the ER with reports of shortness of breath that has been ongoing since around 6:00 p.m.[name]  Patient reports that she has tried several breathing treatments at home without relief.  Patient reports that she does have a history of asthma.  Patient denies chest pain can not get a deep breath.  Patient denies fever or other complaints.  Patient underwent diagnostics with results as noted in ED course.  Lab work was ordered on the patient blood cultures, E, and a chest x-ray.  Patient's lab work was found to be unremarkable.  Patient's ABG showed a pCO2 of 40.  Patient was given a continuous nebulizer for an hour.  Patient reports relief of shortness of breath and feeling much better.  She was also given a dose of Decadron  IV.  Patient does feel comfortable being discharged home.  Patient will be sent home with prednisone  and an albuterol  inhaler.  Patient reports that she does have plenty of nebulizer solution.  Patient is encouraged to follow up with her PCP.  Patient verbalizes understanding of this.  Patient was found/suspected to have acute asthma exacerbation  Patient will be discharged in stable condition at this time.  Medical Decision Making Amount and/or Complexity of Data Reviewed Labs: ordered. Radiology: ordered.  Risk Prescription drug management.   ED Course as of 08/27/24 0849  Sat Aug 27, 2024  0657 XR AP MOBILE CHEST No acute cardiopulmonary  disease.  0744 C-REACTIVE PROTEIN (CRP)(!): 1.0  0744 LACTIC ACID: 1.9  0744 COMPREHENSIVE METABOLIC PANEL, NON-FASTING Unremarkable  0744 MAGNESIUM (!): 1.8    Orders Placed This Encounter  . ADULT ROUTINE BLOOD CULTURE, SET OF 2 ADULT BOTTLES (BACTERIA AND YEAST)  . ADULT ROUTINE BLOOD CULTURE, SET OF 2 ADULT BOTTLES (BACTERIA AND YEAST)  . XR AP MOBILE CHEST  . CBC/DIFF  . COMPREHENSIVE METABOLIC PANEL, NON-FASTING  . C-REACTIVE PROTEIN (CRP)  . LACTIC ACID LEVEL W/ REFLEX FOR LEVEL >2.0  . MAGNESIUM   . BLOOD GAS W/ LACTATE REFLEX Venous  . CBC WITH DIFF  . EXTRA TUBES  . BLUE TOP TUBE  . GOLD TOP TUBE  . CANCELED: LACTIC ACID - FIRST REFLEX  . dexAMETHasone  (PF) 10 mg/mL injection  . albuterol  (PROVENTIL ) 2.5 mg / 3 mL (0.083%) neb solution  . budesonide  (PULMICORT  RESPULES) 0.5 mg/2 mL nebulizer suspension  . albuterol  sulfate (PROVENTIL  OR VENTOLIN  OR PROAIR ) 90 mcg/actuation Inhalation oral inhaler  . predniSONE  (DELTASONE ) 50 mg Oral Tablet    Impression:  Clinical Impression  Moderate persistent asthma with exacerbation (Primary)    Disposition: Discharged   Portions of this note may have  been dictated using voice recognition software.   ----------------------- Results for orders placed or performed during the hospital encounter of 08/27/24 (from the past 12 hours)  COMPREHENSIVE METABOLIC PANEL, NON-FASTING  Result Value Ref Range   SODIUM 138 136 - 145 mmol/L   POTASSIUM 3.7 3.5 - 5.1 mmol/L   CHLORIDE 105 98 - 107 mmol/L   CO2 TOTAL 23 21 - 31 mmol/L   ANION GAP 10 4 - 13 mmol/L   BUN 11 7 - 25 mg/dL   CREATININE 9.18 9.39 - 1.30 mg/dL   BUN/CREA RATIO 14 6 - 22   ESTIMATED GFR 99 >59 mL/min/1.78m^2   ALBUMIN 4.2 3.5 - 5.7 g/dL   CALCIUM 9.1 8.6 - 89.6 mg/dL   GLUCOSE 890 74 - 890 mg/dL   ALKALINE PHOSPHATASE 56 34 - 104 U/L   ALT (SGPT) 19 7 - 52 U/L   AST (SGOT) 13 13 - 39 U/L   BILIRUBIN TOTAL 0.3 0.3 - 1.0 mg/dL   PROTEIN TOTAL 7.3 6.4 - 8.9  g/dL   ALBUMIN/GLOBULIN RATIO 1.4 0.8 - 1.4   OSMOLALITY, CALCULATED 276 270 - 290 mOsm/kg   CALCIUM, CORRECTED 8.9 8.9 - 10.8 mg/dL   GLOBULIN 3.1 2.0 - 3.5  C-REACTIVE PROTEIN (CRP)  Result Value Ref Range   C-REACTIVE PROTEIN (CRP) 1.0 (H) 0.1 - 0.5 mg/dL  LACTIC ACID LEVEL W/ REFLEX FOR LEVEL >2.0  Result Value Ref Range   LACTIC ACID 1.9 0.5 - 2.2 mmol/L  MAGNESIUM   Result Value Ref Range   MAGNESIUM  1.8 (L) 1.9 - 2.7 mg/dL  BLOOD GAS W/ LACTATE REFLEX Venous  Result Value Ref Range   %FIO2 (VENOUS) 21.0 %   PH (VENOUS) 7.37 7.32 - 7.43   PCO2 (VENOUS) 40 (L) 41 - 51 mm/Hg   PO2 (VENOUS) 82 35 - 50 mm/Hg   BICARBONATE (VENOUS) 23.3 22.0 - 29.0 mmol/L   BASE DEFICIT 2.0 0.0 - 3.0 mmol/L   O2 SATURATION (VENOUS) 95.7 40.0 - 85.0 %   LACTATE 2.2 (H) <=1.9 mmol/L  CBC WITH DIFF  Result Value Ref Range   WBC 7.6 3.8 - 11.8 x10^3/uL   RBC 4.72 3.63 - 4.92 x10^6/uL   HGB 12.6 10.9 - 14.3 g/dL   HCT 62.4 68.7 - 58.0 %   MCV 79.3 75.5 - 95.3 fL   MCH 26.6 24.7 - 32.8 pg   MCHC 33.5 32.3 - 35.6 g/dL   RDW 82.2 87.6 - 82.2 %   PLATELETS 190 140 - 440 x10^3/uL   MPV 7.8 (L) 7.9 - 10.8 fL   NEUTROPHIL % 73 43 - 77 %   LYMPHOCYTE % 19 16 - 46 %   MONOCYTE % 5 4 - 11 %   EOSINOPHIL % 2 1 - 7 %   BASOPHIL % 1 0 - 1 %   NEUTROPHIL # 5.60 1.90 - 8.20 x10^3/uL   LYMPHOCYTE # 1.40 1.10 - 3.10 x10^3/uL   MONOCYTE # 0.40 0.20 - 0.90 x10^3/uL   EOSINOPHIL # 0.20 0.00 - 0.50 x10^3/uL   BASOPHIL # 0.00 0.00 - 0.10 x10^3/uL   XR AP MOBILE CHEST  Final Result  No acute cardiopulmonary disease.        Radiologist location ID: [id number]             [1] No Known Allergies

## 2024-08-27 NOTE — Unmapped External Note (Signed)
 Patient received to room 12 with complaints of SOB. Pt reports Hx of asthma. States increased SOB starting last night. Taken 2 albuterol  tx's with minimal relief. Lungs diminished throughout with expiratory wheezes upon assessment. Denies CP at this time. Patient placed on monitor, VSS, assessment and history completed. Call light in hand, encouraged to call for any needs.

## 2024-08-27 NOTE — ED Triage Notes (Signed)
 Severe shortness of breath all night, no relief with HHN, hx of asthma

## 2024-08-30 ENCOUNTER — Encounter: Payer: Self-pay | Admitting: Oncology

## 2024-08-31 ENCOUNTER — Emergency Department (HOSPITAL_COMMUNITY)

## 2024-08-31 ENCOUNTER — Other Ambulatory Visit: Payer: Self-pay

## 2024-08-31 ENCOUNTER — Encounter (HOSPITAL_COMMUNITY): Payer: Self-pay

## 2024-08-31 ENCOUNTER — Observation Stay (HOSPITAL_COMMUNITY)
Admission: EM | Admit: 2024-08-31 | Discharge: 2024-09-02 | Disposition: A | Discharge status: Home / Self Care | Attending: Internal Medicine | Admitting: Internal Medicine

## 2024-08-31 DIAGNOSIS — R059 Cough, unspecified: Secondary | ICD-10-CM | POA: Insufficient documentation

## 2024-08-31 DIAGNOSIS — Z79899 Other long term (current) drug therapy: Secondary | ICD-10-CM | POA: Diagnosis not present

## 2024-08-31 DIAGNOSIS — J45901 Unspecified asthma with (acute) exacerbation: Secondary | ICD-10-CM | POA: Diagnosis present

## 2024-08-31 DIAGNOSIS — R079 Chest pain, unspecified: Secondary | ICD-10-CM | POA: Diagnosis not present

## 2024-08-31 DIAGNOSIS — R911 Solitary pulmonary nodule: Secondary | ICD-10-CM | POA: Diagnosis not present

## 2024-08-31 DIAGNOSIS — R0602 Shortness of breath: Secondary | ICD-10-CM | POA: Diagnosis not present

## 2024-08-31 DIAGNOSIS — J4551 Severe persistent asthma with (acute) exacerbation: Secondary | ICD-10-CM | POA: Diagnosis not present

## 2024-08-31 DIAGNOSIS — J4541 Moderate persistent asthma with (acute) exacerbation: Secondary | ICD-10-CM | POA: Diagnosis not present

## 2024-08-31 LAB — BASIC METABOLIC PANEL WITH GFR
Anion gap: 13 (ref 5–15)
BUN: 9 mg/dL (ref 6–20)
CO2: 23 mmol/L (ref 22–32)
Calcium: 9.2 mg/dL (ref 8.9–10.3)
Chloride: 104 mmol/L (ref 98–111)
Creatinine, Ser: 0.73 mg/dL (ref 0.44–1.00)
GFR, Estimated: >60 mL/min (ref >60)
Glucose, Bld: 88 mg/dL (ref 70–99)
Potassium: 4 mmol/L (ref 3.5–5.1)
Sodium: 139 mmol/L (ref 135–145)

## 2024-08-31 LAB — CBC
HCT: 40.6 % (ref 36.0–46.0)
Hemoglobin: 12.7 g/dL (ref 12.0–15.0)
MCH: 25.3 pg — ABNORMAL LOW (ref 26.0–34.0)
MCHC: 31.3 g/dL (ref 30.0–36.0)
MCV: 81 fL (ref 80.0–100.0)
Platelets: 252 K/uL (ref 150–400)
RBC: 5.01 MIL/uL (ref 3.87–5.11)
RDW: 15.9 % — ABNORMAL HIGH (ref 11.5–15.5)
WBC: 9 K/uL (ref 4.0–10.5)
nRBC: 0 % (ref 0.0–0.2)

## 2024-08-31 LAB — TROPONIN T, HIGH SENSITIVITY
Troponin T High Sensitivity: <15 ng/L (ref 0–19)
Troponin T High Sensitivity: <15 ng/L (ref 0–19)

## 2024-08-31 LAB — HCG, SERUM, QUALITATIVE: Preg, Serum: NEGATIVE

## 2024-08-31 MED ORDER — IPRATROPIUM-ALBUTEROL 0.5-2.5 (3) MG/3ML IN SOLN
3.0000 mL | Freq: Once | RESPIRATORY_TRACT | Status: AC
  Administered 2024-08-31: 3 mL via RESPIRATORY_TRACT
  Filled 2024-08-31: qty 3

## 2024-08-31 MED ORDER — IOHEXOL 350 MG/ML SOLN
75.0000 mL | Freq: Once | INTRAVENOUS | Status: AC | PRN
  Administered 2024-08-31: 75 mL via INTRAVENOUS

## 2024-08-31 MED ORDER — MAGNESIUM SULFATE 2 GM/50ML IV SOLN
2.0000 g | Freq: Once | INTRAVENOUS | Status: AC
  Administered 2024-08-31: 2 g via INTRAVENOUS
  Filled 2024-08-31: qty 50

## 2024-08-31 MED ORDER — METHYLPREDNISOLONE SODIUM SUCC 125 MG IJ SOLR
125.0000 mg | Freq: Once | INTRAMUSCULAR | Status: AC
  Administered 2024-08-31: 125 mg via INTRAVENOUS
  Filled 2024-08-31: qty 2

## 2024-08-31 MED ORDER — ALBUTEROL SULFATE (2.5 MG/3ML) 0.083% IN NEBU
10.0000 mg/h | INHALATION_SOLUTION | Freq: Once | RESPIRATORY_TRACT | Status: AC
  Administered 2024-08-31: 10 mg/h via RESPIRATORY_TRACT
  Filled 2024-08-31: qty 12

## 2024-08-31 NOTE — ED Triage Notes (Addendum)
 Pt came in for SOB and chest pain. Pt stated she went to the ER on Saturday for the same thing and there hasn't been any relief. Pt has a hx of asthma and has tried to use her inhaler. Pt also stated when she takes deep breath her right side hurts more in her chest.

## 2024-08-31 NOTE — Discharge Instructions (Signed)
 Follow-up with your pulmonologist.  Return to the emergency department if your symptoms worsen.

## 2024-08-31 NOTE — ED Provider Notes (Signed)
 33yo female with hx of asthma exacerbation. On prednisone . Had duoneb x 2 CT PE negative Ambulatory pulse ox ok. Xolair  injections, albuterol /neb, see pulmonology.  Physical Exam  BP 127/79 (BP Location: Right Arm)   Pulse 93   Temp 97.8 F (36.6 C) (Oral)   Resp 18   LMP 08/31/2024   SpO2 99% Comment: Abulating  Physical Exam  Procedures  Procedures  ED Course / MDM    Medical Decision Making Amount and/or Complexity of Data Reviewed Labs: ordered. Radiology: ordered.  Risk Prescription drug management.   Patient reports prior admission to the hospital for asthma related complaints.  Never intubated. She is feeling somewhat better although not ready for discharge.  Will try continuous neb and magnesium .  Also IV Solu-Medrol , she is on a prednisone  taper and currently down to maybe 10 or 20 mg daily.

## 2024-08-31 NOTE — ED Provider Notes (Signed)
 [place] EMERGENCY DEPARTMENT AT [facility] Provider Note   CSN: [id number] Arrival date & time: 08/31/24  1732     Patient presents with: Chest Pain and Shortness of Breath   Holly Nielsen is a 33 y.o. female.   33 year old female presenting with shortness of breath.  Patient with history of asthma, was seen at an outside facility 8/30 for worsening shortness of breath on exertion, she was given several breathing treatments and discharged with 5 day course of prednisone  as well as refill of her albuterol  inhaler.  She endorses worsening shortness of breath on exertion or with talking, had a breathing treatment this morning but symptoms continue to persist.  Describes right-sided chest squeezing when she takes a deep breath.  On albuterol /neb/Xolair  for management of her asthma, sees [place] pulmonology.   Chest Pain Associated symptoms: shortness of breath   Shortness of Breath      Prior to Admission medications   Medication Sig Start Date End Date Taking? Authorizing Provider  albuterol  (PROVENTIL ) (2.5 MG/3ML) 0.083% nebulizer solution Take 3 mLs by nebulization every 6 (six) hours as needed for wheezing. Contact Doctor for more refills. 05/10/24   Cobb, Comer GAILS, NP  diphenhydrAMINE  (BENADRYL ) 25 MG tablet Take 2 tablets (50 mg total) by mouth 4 (four) times daily for 1 day, THEN 2 tablets (50 mg total) in the morning, at noon, and at bedtime for 1 day, THEN 2 tablets (50 mg total) every 8 (eight) hours as needed 11/27/23 06/10/24  Tobie Yetta HERO, MD  diphenhydrAMINE  (BENADRYL ) 25 MG tablet Take 1 tablet (25 mg total) by mouth 3 (three) times daily. Take one tablet three times daily for two days 05/31/24   Garrick Charleston, MD  EPINEPHrine  (PRIMATENE  MIST) 0.125 MG/ACT AERO Inhale 1 puff into the lungs daily as needed (shortness of breath).    [provider]  EPINEPHrine  0.3 mg/0.3 mL IJ SOAJ injection Inject 0.3 mg into the muscle as needed for  anaphylaxis. 08/14/23   Kassie Acquanetta Bradley, MD  Fluticasone -Umeclidin-Vilant (TRELEGY ELLIPTA ) 200-62.5-25 MCG/ACT AEPB Inhale 1 puff into the lungs daily. Rinse mouth after use. 04/04/24   Kassie Acquanetta Bradley, MD  levalbuterol  (XOPENEX  HFA) 45 MCG/ACT inhaler Inhale 2 puffs into the lungs every 4 (four) hours as needed. 04/14/24     PARoxetine  (PAXIL -CR) 25 MG 24 hr tablet Take 1 tablet (25 mg total) by mouth every morning. 11/29/23   Tobie Yetta HERO, MD  predniSONE  (DELTASONE ) 20 MG tablet Take 2 tablets (40 mg total) by mouth daily with breakfast. For the next four days 05/31/24   Garrick Charleston, MD  Vitamin D , Ergocalciferol , (DRISDOL ) 1.25 MG (50000 UNIT) CAPS capsule Take 1 capsule (50,000 Units total) by mouth every 7 (seven) days. 04/14/24   Vincente Shivers, NP    Allergies: Capsicum, Pepcid  [famotidine ], Buspirone, Other, Celery oil, Iron sucrose, Nutricap [actical], Pedi-pre tape spray [wound dressing adhesive], Wound dressings, and Anise extract [flavoring agent (non-screening)]    Review of Systems  Respiratory:  Positive for shortness of breath.     Updated Vital Signs  Vitals:   08/31/24 1805 08/31/24 1947 08/31/24 2201 08/31/24 2228  BP: (!) 146/113 (!) 156/107 127/79   Pulse: 81 83 80 93  Resp: (!) 23 16 18    Temp: 98.1 F (36.7 C) 98 F (36.7 C) 97.8 F (36.6 C)   TempSrc: Oral  Oral   SpO2: 98% 97% 98% 99%     Physical Exam Vitals and nursing note reviewed.  Constitutional:  Appearance: She is obese.  Eyes:     Extraocular Movements: Extraocular movements intact.  Cardiovascular:     Rate and Rhythm: Normal rate and regular rhythm.     Heart sounds: Normal heart sounds.  Pulmonary:     Effort: Pulmonary effort is normal.     Breath sounds: Decreased air movement present.     Comments: Speaking in full sentences, No signs of respiratory distress, including tripoding or accessory muscle use Trace expiratory wheeze in upper lung fields Musculoskeletal:     Cervical  back: Normal range of motion.     Right lower leg: No edema.     Left lower leg: No edema.     Comments: Moves all extremities spontaneously without difficulty  Skin:    General: Skin is warm and dry.  Neurological:     Mental Status: She is alert and oriented to person, place, and time.     (all labs ordered are listed, but only abnormal results are displayed) Labs Reviewed  CBC - Abnormal; Notable for the following components:      Result Value   MCH 25.3 (*)    RDW 15.9 (*)    All other components within normal limits  BASIC METABOLIC PANEL WITH GFR  HCG, SERUM, QUALITATIVE  TROPONIN T, HIGH SENSITIVITY  TROPONIN T, HIGH SENSITIVITY    EKG: EKG Interpretation Date/Time:  Wednesday August 31 2024 18:01:57 EDT Ventricular Rate:  80 PR Interval:  143 QRS Duration:  101 QT Interval:  414 QTC Calculation: 478 R Axis:   59  Text Interpretation: Sinus rhythm Borderline prolonged QT interval Confirmed by Laurice Coy [phone]) on 08/31/2024 9:20:09 PM  Radiology: DG Chest 2 View Result Date: 08/31/2024 CLINICAL DATA:  Shortness of breath, chest pain EXAM: CHEST - 2 VIEW COMPARISON:  Chest radiograph November 26, 2023 FINDINGS: The heart size is borderline normal, improved to prior and within normal limits. Mediastinal contours are normal. Bronchial wall thickening. No new consolidation or nodule. No pleural effusion. The visualized skeletal structures are unremarkable. IMPRESSION: Borderline heart size without suspicious new consolidation or pulmonary nodule. Suggestion of bronchial wall thickening. Electronically Signed   By: Megan  Zare M.D.   On: 08/31/2024 19:05     Procedures   Medications Ordered in the ED  ipratropium-albuterol  (DUONEB) 0.5-2.5 (3) MG/3ML nebulizer solution 3 mL (3 mLs Nebulization Given 08/31/24 2039)  ipratropium-albuterol  (DUONEB) 0.5-2.5 (3) MG/3ML nebulizer solution 3 mL (3 mLs Nebulization Given 08/31/24 2148)  iohexol  (OMNIPAQUE ) 350 MG/ML  injection 75 mL (75 mLs Intravenous Contrast Given 08/31/24 2130)                                    Medical Decision Making This patient presents to the ED for concern of shortness of breath, this involves an extensive number of treatment options, and is a complaint that carries with it a high risk of complications and morbidity.  The differential diagnosis includes asthma exacerbation, pneumonia, pulmonary embolism   Co morbidities that complicate the patient evaluation  Asthma   Additional history obtained:  Additional history obtained from record review External records from outside source obtained and reviewed including recent emergency department visit from outside facility 8/30   Lab Tests:  I Ordered, and personally interpreted labs.  The pertinent results include: CBC stable from previous.  BMP within normal limits.  Initial troponin <15.  Serum hCG negative.   Imaging Studies ordered:  I ordered imaging studies including CXR, CTA I independently visualized and interpreted imaging which showed  - CXR: Borderline heart size without suspicious new consolidation or pulmonary nodule. Suggestion of bronchial wall thickening. - CTA: 1. No pulmonary embolism. 2. Mosaic attenuation throughout the pulmonary parenchyma, likely related to multifocal air trapping related to small airways disease. 3. Stable 7 mm subpleural pulmonary nodule within the right lower lobe. A patient without a history of malignancy of this age, this is most likely post infectious or post inflammatory in nature.  I agree with the radiologist interpretation   Cardiac Monitoring: / EKG:  The patient was maintained on a cardiac monitor.  I personally viewed and interpreted the cardiac monitored which showed an underlying rhythm of: NSR   Problem List / ED Course / Critical interventions / Medication management  I ordered medication including DuoNeb x 2  for shortness of breath  Reevaluation of the patient  after these medicines showed that the patient stayed the same I have reviewed the patients home medicines and have made adjustments as needed   Social Determinants of Health:  Insufficient physical activity   Test / Admission - Considered:  Physical exam notable as above, there is some diminished air movement with trace expiratory wheeze, however no signs of respiratory distress, she is speaking in full sentences without tripoding or accessory muscle use.  Patient's oxygen saturation drops <94% while I am in the room speaking with her, given this as well as continued shortness of breath despite compliance with her asthma regimen I did proceed with a CTA study to rule out PE.  See above for these results, no pulmonary embolism. She has received 2 DuoNeb treatments in the emergency department this evening, she continues to endorse shortness of breath. Nursing staff helped the patient ambulate and report that she maintained her O2 sats between 98-100% without tachycardia. Patient may benefit from additional breathing treatment prior to discharge, however given these reassuring findings as above I do feel that she would likely be discharged later this evening pending improvement at time of reassessment by oncoming PA.  Patient is followed closely by pulmonology, she tells me that she does not have an appointment scheduled till October.  Handed off to oncoming PA-C Leita Chancy pending reassessment and dispo plan    Amount and/or Complexity of Data Reviewed Labs: ordered. Radiology: ordered.  Risk Prescription drug management.        Final diagnoses:  Shortness of breath    ED Discharge Orders     None          Glendia Rocky LOISE DEVONNA 08/31/24 2241    Laurice Maude BROCKS, MD 08/31/24 [phone]

## 2024-09-01 ENCOUNTER — Encounter (HOSPITAL_BASED_OUTPATIENT_CLINIC_OR_DEPARTMENT_OTHER): Payer: Self-pay | Admitting: Pulmonary Disease

## 2024-09-01 ENCOUNTER — Encounter (HOSPITAL_COMMUNITY): Payer: Self-pay | Admitting: Family Medicine

## 2024-09-01 ENCOUNTER — Telehealth (HOSPITAL_BASED_OUTPATIENT_CLINIC_OR_DEPARTMENT_OTHER): Payer: Self-pay

## 2024-09-01 DIAGNOSIS — J45901 Unspecified asthma with (acute) exacerbation: Secondary | ICD-10-CM | POA: Diagnosis present

## 2024-09-01 DIAGNOSIS — R0602 Shortness of breath: Secondary | ICD-10-CM

## 2024-09-01 DIAGNOSIS — J4551 Severe persistent asthma with (acute) exacerbation: Secondary | ICD-10-CM

## 2024-09-01 LAB — RESPIRATORY PANEL BY PCR

## 2024-09-01 LAB — SARS CORONAVIRUS 2 BY RT PCR: SARS Coronavirus 2 by RT PCR: NEGATIVE

## 2024-09-01 MED ORDER — BUDESON-GLYCOPYRROL-FORMOTEROL 160-9-4.8 MCG/ACT IN AERO
2.0000 | INHALATION_SPRAY | Freq: Two times a day (BID) | RESPIRATORY_TRACT | Status: DC
  Administered 2024-09-01 – 2024-09-02 (×3): 2 via RESPIRATORY_TRACT
  Filled 2024-09-01: qty 5.9

## 2024-09-01 MED ORDER — PROCHLORPERAZINE EDISYLATE 10 MG/2ML IJ SOLN: 5.0000 mg | Freq: Four times a day (QID) | INTRAMUSCULAR | Status: DC | PRN

## 2024-09-01 MED ORDER — SENNOSIDES-DOCUSATE SODIUM 8.6-50 MG PO TABS: 1.0000 | ORAL_TABLET | Freq: Every evening | ORAL | Status: DC | PRN

## 2024-09-01 MED ORDER — ALBUTEROL SULFATE (2.5 MG/3ML) 0.083% IN NEBU
2.5000 mg | INHALATION_SOLUTION | RESPIRATORY_TRACT | Status: DC | PRN
  Administered 2024-09-01 – 2024-09-02 (×2): 2.5 mg via RESPIRATORY_TRACT
  Filled 2024-09-01 (×2): qty 3

## 2024-09-01 MED ORDER — METHYLPREDNISOLONE SODIUM SUCC 40 MG IJ SOLR
40.0000 mg | Freq: Two times a day (BID) | INTRAMUSCULAR | Status: DC
  Administered 2024-09-01 – 2024-09-02 (×2): 40 mg via INTRAVENOUS
  Filled 2024-09-01 (×2): qty 1

## 2024-09-01 MED ORDER — METHYLPREDNISOLONE SODIUM SUCC 125 MG IJ SOLR
125.0000 mg | Freq: Two times a day (BID) | INTRAMUSCULAR | Status: DC
  Administered 2024-09-01: 125 mg via INTRAVENOUS
  Filled 2024-09-01: qty 2

## 2024-09-01 MED ORDER — ACETAMINOPHEN 325 MG PO TABS
650.0000 mg | ORAL_TABLET | Freq: Four times a day (QID) | ORAL | Status: DC | PRN
  Administered 2024-09-01: 650 mg via ORAL
  Filled 2024-09-01: qty 2

## 2024-09-01 MED ORDER — ENOXAPARIN SODIUM 40 MG/0.4ML IJ SOSY
40.0000 mg | PREFILLED_SYRINGE | INTRAMUSCULAR | Status: DC
  Filled 2024-09-01 (×2): qty 0.4

## 2024-09-01 MED ORDER — ACETAMINOPHEN 650 MG RE SUPP: 650.0000 mg | Freq: Four times a day (QID) | RECTAL | Status: DC | PRN

## 2024-09-01 NOTE — Progress Notes (Addendum)
  Progress Note   Patient: Holly Nielsen FMW:[id number] DOB: [birth year] DOA: 08/31/2024     0 DOS: the patient was seen and examined on 09/01/2024   No charge note since pt was admitted earlier this AM  Brief hospital course: 33 y.o. female with medical history significant for asthma, depression, anxiety, anaphylaxis, and BMI 57 who presents with persistent shortness of breath despite prednisone  and albuterol  at home.   Patient developed shortness of breath and nonproductive cough on 08/27/2024 while visiting family in [place] .  She was seen in a local ED at that time where she was prescribed prednisone  and had her albuterol  refilled.  She continues to have shortness of breath and nonproductive cough despite this.  She also has a discomfort in her right chest with deep breath or cough.  She denies fevers.   ED Course: Upon arrival to the ED, patient is found to be afebrile and saturating well on room air with tachypnea, normal HR, and stable BP.  Labs are most notable for normal creatinine, normal WBC, and normal troponin x 2.  CTA chest is negative for PE but notable for mosaic attenuation throughout the pulmonary parenchyma which is likely related to small airways disease.   Patient was treated in the ED with 125 mg IV Solu-Medrol , 2 g IV magnesium , DuoNeb x 2, and 10 mg continuous albuterol  treatment  Assessment and Plan: 1. Asthma exacerbation   - Treated in ED with IV magnesium , DuoNeb x2, 10 mg albuterol , and IV Solu-Medrol   - Continue systemic steroids and albuterol   -Still reports subjective sob -Given progressive nature of sob, will check respiratory viral panel. Of note, pt was recently at family gathering around the time of sx onset -COVID neg      Subjective: Still complaining of sob, required PRN neb this AM  Physical Exam: Vitals:   09/01/24 0351 09/01/24 0848 09/01/24 0920 09/01/24 1214  BP:  (!) 140/74  (!) 160/87  Pulse:  93  96  Resp:  18  18  Temp:  97.7 F (36.5  C)  98.2 F (36.8 C)  TempSrc:      SpO2:  93% 94% 95%  Weight: 132.9 kg     Height: 5' (1.524 m)      General exam: Awake, laying in bed, in nad Respiratory system: Increased respiratory effort, no audible wheezing when seen. Pt had just received breathing tx Cardiovascular system: regular rate, s1, s2 Gastrointestinal system: Soft, nondistended, positive BS Central nervous system: CN2-12 grossly intact, strength intact Extremities: Perfused, no clubbing Skin: Normal skin turgor, no notable skin lesions seen Psychiatry: Mood normal // no visual hallucinations   Data Reviewed:  There are no new results to review at this time.  Family Communication: Pt in room, family not at bedside  Disposition: Status is: Observation The patient remains OBS appropriate and will d/c before 2 midnights.  Planned Discharge Destination: Home    Author: Garnette Pelt, MD 09/01/2024 3:34 PM  For on call review [email or web address].

## 2024-09-01 NOTE — Plan of Care (Signed)
   Problem: Education: Goal: Knowledge of General Education information will improve Description Including pain rating scale, medication(s)/side effects and non-pharmacologic comfort measures Outcome: Progressing   Problem: Health Behavior/Discharge Planning: Goal: Ability to manage health-related needs will improve Outcome: Progressing

## 2024-09-01 NOTE — Plan of Care (Signed)

## 2024-09-01 NOTE — Telephone Encounter (Unsigned)
 Copied from CRM [id number]. Topic: Clinical - Lab/Test Results >> Sep 01, 2024 11:52 AM Devaughn RAMAN wrote: Reason for CRM: Patient is calling and she is currently inpatient at [facility], patient stated they found a nodule on inside of right lung and it is 7 millimeters and she would like to discuss this with Dr.Ellison, patient is requesting Dr.Ellison see her while she is inpatient at [facility].

## 2024-09-01 NOTE — Hospital Course (Signed)
 33 y.o. female with medical history significant for asthma, depression, anxiety, anaphylaxis, and BMI 57 who presents with persistent shortness of breath despite prednisone  and albuterol  at home.   Patient developed shortness of breath and nonproductive cough on 08/27/2024 while visiting family in [place] .  She was seen in a local ED at that time where she was prescribed prednisone  and had her albuterol  refilled.  She continues to have shortness of breath and nonproductive cough despite this.  She also has a discomfort in her right chest with deep breath or cough.  She denies fevers.   ED Course: Upon arrival to the ED, patient is found to be afebrile and saturating well on room air with tachypnea, normal HR, and stable BP.  Labs are most notable for normal creatinine, normal WBC, and normal troponin x 2.  CTA chest is negative for PE but notable for mosaic attenuation throughout the pulmonary parenchyma which is likely related to small airways disease.   Patient was treated in the ED with 125 mg IV Solu-Medrol , 2 g IV magnesium , DuoNeb x 2, and 10 mg continuous albuterol  treatment

## 2024-09-01 NOTE — H&P (Signed)
 History and Physical    Holly Nielsen:[id number] DOB: 27-Jan-1991 DOA: 08/31/2024  PCP: Vincente Shivers, NP   Patient coming from: Home   Chief Complaint: SOB   HPI: Holly Nielsen is a 33 y.o. female with medical history significant for asthma, depression, anxiety, anaphylaxis, and BMI 57 who presents with persistent shortness of breath despite prednisone  and albuterol  at home.  Patient developed shortness of breath and nonproductive cough on 08/27/2024 while visiting family in [place] .  She was seen in a local ED at that time where she was prescribed prednisone  and had her albuterol  refilled.  She continues to have shortness of breath and nonproductive cough despite this.  She also has a discomfort in her right chest with deep breath or cough.  She denies fevers.  ED Course: Upon arrival to the ED, patient is found to be afebrile and saturating well on room air with tachypnea, normal HR, and stable BP.  Labs are most notable for normal creatinine, normal WBC, and normal troponin x 2.  CTA chest is negative for PE but notable for mosaic attenuation throughout the pulmonary parenchyma which is likely related to small airways disease.  Patient was treated in the ED with 125 mg IV Solu-Medrol , 2 g IV magnesium , DuoNeb x 2, and 10 mg continuous albuterol  treatment.  Review of Systems:  All other systems reviewed and apart from HPI, are negative.  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   Severe persistent asthma 03/13/2022   Ureteral stone with hydronephrosis 11/23/2023   Urinary tract infection     Past Surgical History:  Procedure Laterality Date   APPENDECTOMY     CHOLECYSTECTOMY     SEPTOPLASTY Bilateral 01/15/2024   Procedure: SEPTOPLASTY;  Surgeon: Maggie Hussar, MD;  Location: [facility] OR;  Service: ENT;  Laterality: Bilateral;   thyroid  cyst removal     TONSILLECTOMY     TURBINATE REDUCTION Bilateral 01/15/2024   Procedure: BILATERAL INFERIOR TURBINATE REDUCTION;  Surgeon: Maggie Hussar, MD;  Location: [facility] OR;  Service: ENT;  Laterality: Bilateral;   WISDOM TOOTH EXTRACTION      Social History:   reports that she has never smoked. She has never used smokeless tobacco. She reports that she does not drink alcohol and does not use drugs.  Allergies  Allergen Reactions   Capsicum Anaphylaxis   Pepcid  [Famotidine ] Anaphylaxis   Buspirone Diarrhea   Other Nausea And Vomiting and Diarrhea    Green bell peppers   Celery Oil Diarrhea   Iron Sucrose Other (See Comments)     Dizziness, Pt became flushed, hot and diaphoretic after completion of infusion.      Nutricap [Actical] Diarrhea   Pedi-Pre Tape Spray [Wound Dressing Adhesive] Hives   Wound Dressings Hives   Anise Extract [Flavoring Agent (Non-Screening)] Hives and Rash    Vanilla Extract  Family History  Problem Relation Age of Onset   Learning disabilities Father    Hypertension Father    Hyperlipidemia Father    Diabetes Father    Asthma Father    Hypertension Paternal Grandmother    Hyperlipidemia Paternal Grandmother    Diabetes Paternal Grandmother    Diabetes Mellitus I Paternal Grandmother    Hypertension Paternal Grandfather    Hyperlipidemia Paternal Grandfather    Diabetes Paternal Grandfather    Stroke Paternal Grandfather    Heart attack Paternal Grandfather    Leukemia Paternal Great-grandfather       Prior to Admission medications   Medication Sig Start Date End Date Taking? Authorizing Provider  albuterol  (PROVENTIL ) (2.5 MG/3ML) 0.083% nebulizer solution Take 3 mLs by nebulization every 6 (six) hours as needed for wheezing. Contact Doctor for more refills. 05/10/24   Cobb, Comer GAILS, NP  diphenhydrAMINE  (BENADRYL ) 25 MG tablet Take 2 tablets (50 mg total) by mouth 4 (four) times daily for 1 day, THEN 2 tablets (50 mg total) in the morning, at noon, and at bedtime for 1 day, THEN 2 tablets (50 mg total) every 8 (eight) hours as needed 11/27/23 06/10/24  Tobie Yetta HERO, MD  diphenhydrAMINE  (BENADRYL ) 25 MG tablet Take 1 tablet (25 mg total) by mouth 3 (three) times daily. Take one tablet three times daily for two days 05/31/24   Garrick Charleston, MD  EPINEPHrine  (PRIMATENE  MIST) 0.125 MG/ACT AERO Inhale 1 puff into the lungs daily as needed (shortness of breath).    [provider]  EPINEPHrine  0.3 mg/0.3 mL IJ SOAJ injection Inject 0.3 mg into the muscle as needed for anaphylaxis. 08/14/23   Kassie Acquanetta Bradley, MD  Fluticasone -Umeclidin-Vilant (TRELEGY ELLIPTA ) 200-62.5-25 MCG/ACT AEPB Inhale 1 puff into the lungs daily. Rinse mouth after use. 04/04/24   Kassie Acquanetta Bradley, MD  levalbuterol  (XOPENEX  HFA) 45 MCG/ACT inhaler Inhale 2 puffs into the lungs every 4 (four) hours as needed. 04/14/24     PARoxetine  (PAXIL -CR) 25 MG 24 hr tablet Take 1 tablet (25 mg total) by mouth every morning. 11/29/23   Tobie Yetta HERO, MD  predniSONE  (DELTASONE ) 20 MG tablet Take 2 tablets (40 mg total) by mouth daily with breakfast. For the next four days 05/31/24   Garrick Charleston, MD  Vitamin D , Ergocalciferol , (DRISDOL ) 1.25 MG (50000 UNIT) CAPS capsule Take 1 capsule (50,000 Units total) by mouth every 7 (seven) days. 04/14/24   Vincente Shivers, NP    Physical Exam: Vitals:   08/31/24 2309 09/01/24 0045 09/01/24 0350 09/01/24 0351  BP:  (!) 167/89 (!) 155/88   Pulse:  80 90   Resp:  17 (!) 22   Temp:   98 F (36.7 C) 98.1 F (36.7 C)   TempSrc:      SpO2: 95% 100% 95%   Weight:    132.9 kg  Height:    5' (1.524 m)    Constitutional: NAD, no diaphoresis   Eyes: PERTLA, lids and conjunctivae normal ENMT: Mucous membranes are moist. Posterior pharynx clear of any exudate or lesions.   Neck: supple, no masses  Respiratory: Speaking in full sentences. Diminished breath sounds with prolonged expiratory phase. No accessory muscle use.  Cardiovascular: S1 & S2 heard, regular rate and rhythm. No JVD.  Abdomen: No tenderness, soft. Bowel sounds active.  Musculoskeletal: no clubbing / cyanosis. No joint deformity upper and lower extremities.   Skin: no significant rashes, lesions, ulcers. Warm, dry, well-perfused. Neurologic: CN 2-12 grossly intact. Moving all extremities.  Alert and oriented.  Psychiatric: Calm. Cooperative.    Labs and Imaging on Admission: I have personally reviewed following labs and imaging studies  CBC: Recent Labs  Lab 08/31/24 1814  WBC 9.0  HGB 12.7  HCT 40.6  MCV 81.0  PLT 252   Basic Metabolic Panel: Recent Labs  Lab 08/31/24 1814  NA 139  K 4.0  CL 104  CO2 23  GLUCOSE 88  BUN 9  CREATININE 0.73  CALCIUM 9.2   GFR: Estimated Creatinine Clearance: 128.3 mL/min (by C-G formula based on SCr of 0.73 mg/dL). Liver Function Tests: No results for input(s): AST, ALT, ALKPHOS, BILITOT, PROT, ALBUMIN in the last 168 hours. No results for input(s): LIPASE, AMYLASE in the last 168 hours. No results for input(s): AMMONIA in the last 168 hours. Coagulation Profile: No results for input(s): INR, PROTIME in the last 168 hours. Cardiac Enzymes: No results for input(s): CKTOTAL, CKMB, CKMBINDEX, TROPONINI in the last 168 hours. BNP (last 3 results) No results for input(s): PROBNP in the last 8760 hours. HbA1C: No results for input(s): HGBA1C in the last 72 hours. CBG: No results for input(s): GLUCAP in the last 168  hours. Lipid Profile: No results for input(s): CHOL, HDL, LDLCALC, TRIG, CHOLHDL, LDLDIRECT in the last 72 hours. Thyroid  Function Tests: No results for input(s): TSH, T4TOTAL, FREET4, T3FREE, THYROIDAB in the last 72 hours. Anemia Panel: No results for input(s): VITAMINB12, FOLATE, FERRITIN, TIBC, IRON, RETICCTPCT in the last 72 hours. Urine analysis:    Component Value Date/Time   COLORURINE YELLOW 07/13/2024 1129   APPEARANCEUR HAZY (A) 07/13/2024 1129   LABSPEC 1.023 07/13/2024 1129   PHURINE 5.0 07/13/2024 1129   GLUCOSEU NEGATIVE 07/13/2024 1129   HGBUR NEGATIVE 07/13/2024 1129   BILIRUBINUR NEGATIVE 07/13/2024 1129   BILIRUBINUR neg 04/13/2024 1343   KETONESUR NEGATIVE 07/13/2024 1129   PROTEINUR NEGATIVE 07/13/2024 1129   UROBILINOGEN 0.2 04/13/2024 1343   NITRITE NEGATIVE 07/13/2024 1129   LEUKOCYTESUR LARGE (A) 07/13/2024 1129   Sepsis Labs: @LABRCNTIP (procalcitonin:4,lacticidven:4) )No results found for this or any previous visit (from the past 240 hours).   Radiological Exams on Admission: CT Angio Chest Pulmonary Embolism (PE) W or WO Contrast Result Date: 08/31/2024 CLINICAL DATA:  Dyspnea on exertion EXAM: CT ANGIOGRAPHY CHEST WITH CONTRAST TECHNIQUE: Multidetector CT imaging of the chest was performed using the standard protocol during bolus administration of intravenous contrast. Multiplanar CT image reconstructions and MIPs were obtained to evaluate the vascular anatomy. RADIATION DOSE REDUCTION: This exam was performed according to the departmental dose-optimization program which includes automated exposure control, adjustment of the mA and/or kV according to patient size and/or use of iterative reconstruction technique. CONTRAST:  75mL OMNIPAQUE  IOHEXOL  350 MG/ML SOLN COMPARISON:  None Available. FINDINGS: Cardiovascular: Satisfactory opacification of the pulmonary arteries to the segmental level. No evidence of pulmonary embolism.  Normal heart size. No pericardial effusion. Mediastinum/Nodes: No enlarged mediastinal, hilar, or axillary lymph nodes. Thyroid  gland, trachea, and esophagus demonstrate no significant findings. Lungs/Pleura: 7 mm subpleural pulmonary nodule within the right lower lobe (81/12) is stable since 11/22/2023. Mosaic attenuation throughout the pulmonary parenchyma, best appreciated within the lung bases, likely relates to multifocal air trapping related to small airways disease. No confluent pulmonary infiltrate. No pneumothorax or pleural effusion. No central obstructing lesion Upper Abdomen: No acute abnormality. Musculoskeletal: No chest wall abnormality. No acute or significant osseous findings. Review of the MIP images confirms the above findings. IMPRESSION: 1. No pulmonary embolism. 2. Mosaic attenuation throughout the pulmonary  parenchyma, likely related to multifocal air trapping related to small airways disease. 3. Stable 7 mm subpleural pulmonary nodule within the right lower lobe. A patient without a history of malignancy of this age, this is most likely post infectious or post inflammatory in nature. Electronically Signed   By: Dorethia Molt M.D.   On: 08/31/2024 22:24   DG Chest 2 View Result Date: 08/31/2024 CLINICAL DATA:  Shortness of breath, chest pain EXAM: CHEST - 2 VIEW COMPARISON:  Chest radiograph November 26, 2023 FINDINGS: The heart size is borderline normal, improved to prior and within normal limits. Mediastinal contours are normal. Bronchial wall thickening. No new consolidation or nodule. No pleural effusion. The visualized skeletal structures are unremarkable. IMPRESSION: Borderline heart size without suspicious new consolidation or pulmonary nodule. Suggestion of bronchial wall thickening. Electronically Signed   By: Megan  Zare M.D.   On: 08/31/2024 19:05    EKG: Independently reviewed. Sinus rhythm.   Assessment/Plan  1. Asthma exacerbation   - Treated in ED with IV magnesium ,  DuoNeb x2, 10 mg albuterol , and IV Solu-Medrol   - Continue systemic steroids and albuterol     DVT prophylaxis: Lovenox   Code Status: Full  Level of Care: Level of care: Med-Surg Family Communication: None present   Disposition Plan:  Patient is from: home  Anticipated d/c is to: home  Anticipated d/c date is: 09/02/24  Patient currently: Pending improved respiratory status  Consults called: None  Admission status: Observation     Evalene GORMAN Sprinkles, MD [facility]  09/01/2024, 6:02 AM

## 2024-09-02 ENCOUNTER — Other Ambulatory Visit: Payer: Self-pay

## 2024-09-02 DIAGNOSIS — R0602 Shortness of breath: Secondary | ICD-10-CM | POA: Diagnosis not present

## 2024-09-02 DIAGNOSIS — J4551 Severe persistent asthma with (acute) exacerbation: Secondary | ICD-10-CM | POA: Diagnosis not present

## 2024-09-02 LAB — CBC
HCT: 40.2 % (ref 36.0–46.0)
Hemoglobin: 12.4 g/dL (ref 12.0–15.0)
MCH: 25.5 pg — ABNORMAL LOW (ref 26.0–34.0)
MCHC: 30.8 g/dL (ref 30.0–36.0)
MCV: 82.7 fL (ref 80.0–100.0)
Platelets: 260 K/uL (ref 150–400)
RBC: 4.86 MIL/uL (ref 3.87–5.11)
RDW: 16.7 % — ABNORMAL HIGH (ref 11.5–15.5)
WBC: 16.7 K/uL — ABNORMAL HIGH (ref 4.0–10.5)
nRBC: 0 % (ref 0.0–0.2)

## 2024-09-02 LAB — BASIC METABOLIC PANEL WITH GFR
Anion gap: 14 (ref 5–15)
BUN: 15 mg/dL (ref 6–20)
CO2: 19 mmol/L — ABNORMAL LOW (ref 22–32)
Calcium: 9.4 mg/dL (ref 8.9–10.3)
Chloride: 104 mmol/L (ref 98–111)
Creatinine, Ser: 0.67 mg/dL (ref 0.44–1.00)
GFR, Estimated: >60 mL/min (ref >60)
Glucose, Bld: 133 mg/dL — ABNORMAL HIGH (ref 70–99)
Potassium: 4.4 mmol/L (ref 3.5–5.1)
Sodium: 137 mmol/L (ref 135–145)

## 2024-09-02 LAB — HIV ANTIBODY (ROUTINE TESTING W REFLEX): HIV Screen 4th Generation wRfx: NONREACTIVE

## 2024-09-02 MED ORDER — PANTOPRAZOLE SODIUM 40 MG PO TBEC
40.0000 mg | DELAYED_RELEASE_TABLET | Freq: Every day | ORAL | 0 refills | Status: AC
  Filled 2024-09-02: qty 30, 30d supply, fill #0

## 2024-09-02 MED ORDER — PREDNISONE 10 MG PO TABS
ORAL_TABLET | ORAL | 0 refills | Status: AC
  Filled 2024-09-02: qty 22, 11d supply, fill #0

## 2024-09-02 MED ORDER — PANTOPRAZOLE SODIUM 40 MG PO TBEC
40.0000 mg | DELAYED_RELEASE_TABLET | Freq: Every day | ORAL | Status: DC
  Administered 2024-09-02: 40 mg via ORAL
  Filled 2024-09-02: qty 1

## 2024-09-02 NOTE — Discharge Summary (Signed)
 Physician Discharge Summary   Patient: Holly Nielsen MRN: [id number] DOB: 05/09/1991  Admit date:     08/31/2024  Discharge date: 09/02/24  Discharge Physician: Garnette Pelt   PCP: Vincente Shivers, NP   Recommendations at discharge:    Follow up with PCP in 1-2 weeks Follow up with PCCM as scheduled  Discharge Diagnoses: Principal Problem:   Acute asthma exacerbation  Resolved Problems:   * No resolved hospital problems. *  Hospital Course: 33 y.o. female with medical history significant for asthma, depression, anxiety, anaphylaxis, and BMI 57 who presents with persistent shortness of breath despite prednisone  and albuterol  at home.   Patient developed shortness of breath and nonproductive cough on 08/27/2024 while visiting family in [place] .  She was seen in a local ED at that time where she was prescribed prednisone  and had her albuterol  refilled.  She continues to have shortness of breath and nonproductive cough despite this.  She also has a discomfort in her right chest with deep breath or cough.  She denies fevers.   ED Course: Upon arrival to the ED, patient is found to be afebrile and saturating well on room air with tachypnea, normal HR, and stable BP.  Labs are most notable for normal creatinine, normal WBC, and normal troponin x 2.  CTA chest is negative for PE but notable for mosaic attenuation throughout the pulmonary parenchyma which is likely related to small airways disease.   Patient was treated in the ED with 125 mg IV Solu-Medrol , 2 g IV magnesium , DuoNeb x 2, and 10 mg continuous albuterol  treatment  Assessment and Plan: 1. Asthma exacerbation   - Treated in ED with IV magnesium , DuoNeb x2, 10 mg albuterol , and IV Solu-Medrol   - Continue systemic steroids and albuterol , prescribe pred taper on d/c -covid and viral panel neg -Given hx Guillain-Barre, checked NIF which was found to be unremarkable at 50 -Discussed with Pulmonary who recommended keeping current  scheduled appointment - ambulated on RA -Will prescribe protonix  given possible GERD component  2. 7mm lung nodule -appears stable on imaging -could be inflammatory -F/u with Pulmonary as scheduled          Consultants:  Procedures performed:   Disposition: Home Diet recommendation:  Regular diet DISCHARGE MEDICATION: Allergies as of 09/02/2024       Reactions   Capsicum Anaphylaxis   Pepcid  [famotidine ] Anaphylaxis   Buspirone Diarrhea   Other Nausea And Vomiting, Diarrhea   Green bell peppers   Celery Oil Diarrhea   Iron Sucrose Other (See Comments)    Dizziness, Pt became flushed, hot and diaphoretic after completion of infusion.    Nutricap [actical] Diarrhea   Pedi-pre Tape Spray [wound Dressing Adhesive] Hives   Wound Dressings Hives   Anise Extract [flavoring Agent (non-screening)] Hives, Rash   Vanilla Extract        Medication List     STOP taking these medications    diphenhydrAMINE  25 MG tablet Commonly known as: BENADRYL    Xopenex  HFA 45 MCG/ACT inhaler Generic drug: levalbuterol        TAKE these medications    albuterol  (2.5 MG/3ML) 0.083% nebulizer solution Commonly known as: PROVENTIL  Take 3 mLs by nebulization every 6 (six) hours as needed for wheezing. Contact Doctor for more refills.   EPINEPHrine  0.3 mg/0.3 mL Soaj injection Commonly known as: EPI-PEN Inject 0.3 mg into the muscle as needed for anaphylaxis.   pantoprazole  40 MG tablet Commonly known as: PROTONIX  Take 1 tablet (40 mg total) by  mouth daily. Start taking on: September 03, 2024   PARoxetine  25 MG 24 hr tablet Commonly known as: PAXIL -CR Take 1 tablet (25 mg total) by mouth every morning.   predniSONE  10 MG tablet Commonly known as: DELTASONE  Take 4 tablets (40 mg total) by mouth daily for 3 days, THEN 2 tablets (20 mg total) daily for 3 days, THEN 1 tablet (10 mg total) daily for 3 days, THEN 0.5 tablets (5 mg total) daily for 2 days. Start taking on: September 02, 2024 What changed:  medication strength See the new instructions.   Primatene  Mist 0.125 MG/ACT Aero Generic drug: EPINEPHrine  Inhale 1 puff into the lungs daily as needed (shortness of breath).   Trelegy Ellipta  200-62.5-25 MCG/ACT Aepb Generic drug: Fluticasone -Umeclidin-Vilant Inhale 1 puff into the lungs daily. Rinse mouth after use.   Vitamin D  (Ergocalciferol ) 1.25 MG (50000 UNIT) Caps capsule Commonly known as: DRISDOL  Take 1 capsule (50,000 Units total) by mouth every 7 (seven) days.        Follow-up Information     Vincente Shivers, NP. Schedule an appointment as soon as possible for a visit .   Specialty: General Practice Contact information: [address] [place] [address] [phone]         [facility] Emergency Department at [facility] .   Specialty: Emergency Medicine Why: If symptoms worsen Contact information: [address] [place]  [address] [phone]               Discharge Exam: Fredricka Weights   09/01/24 0351  Weight: 132.9 kg   General exam: Awake, laying in bed, in nad Respiratory system: Normal respiratory effort, no wheezing Cardiovascular system: regular rate, s1, s2 Gastrointestinal system: Soft, nondistended, positive BS Central nervous system: CN2-12 grossly intact, strength intact Extremities: Perfused, no clubbing Skin: Normal skin turgor, no notable skin lesions seen Psychiatry: Mood normal // no visual hallucinations   Condition at discharge: fair  The results of significant diagnostics from this hospitalization (including imaging, microbiology, ancillary and laboratory) are listed below for reference.   Imaging Studies: CT Angio Chest Pulmonary Embolism (PE) W or WO Contrast Result Date: 08/31/2024 CLINICAL DATA:  Dyspnea on exertion EXAM: CT ANGIOGRAPHY CHEST WITH CONTRAST TECHNIQUE: Multidetector CT imaging of the chest was performed using the standard protocol during  bolus administration of intravenous contrast. Multiplanar CT image reconstructions and MIPs were obtained to evaluate the vascular anatomy. RADIATION DOSE REDUCTION: This exam was performed according to the departmental dose-optimization program which includes automated exposure control, adjustment of the mA and/or kV according to patient size and/or use of iterative reconstruction technique. CONTRAST:  75mL OMNIPAQUE  IOHEXOL  350 MG/ML SOLN COMPARISON:  None Available. FINDINGS: Cardiovascular: Satisfactory opacification of the pulmonary arteries to the segmental level. No evidence of pulmonary embolism. Normal heart size. No pericardial effusion. Mediastinum/Nodes: No enlarged mediastinal, hilar, or axillary lymph nodes. Thyroid  gland, trachea, and esophagus demonstrate no significant findings. Lungs/Pleura: 7 mm subpleural pulmonary nodule within the right lower lobe (81/12) is stable since 11/22/2023. Mosaic attenuation throughout the pulmonary parenchyma, best appreciated within the lung bases, likely relates to multifocal air trapping related to small airways disease. No confluent pulmonary infiltrate. No pneumothorax or pleural effusion. No central obstructing lesion Upper Abdomen: No acute abnormality. Musculoskeletal: No chest wall abnormality. No acute or significant osseous findings. Review of the MIP images confirms the above findings. IMPRESSION: 1. No pulmonary embolism. 2. Mosaic attenuation throughout the pulmonary parenchyma, likely related to multifocal air  trapping related to small airways disease. 3. Stable 7 mm subpleural pulmonary nodule within the right lower lobe. A patient without a history of malignancy of this age, this is most likely post infectious or post inflammatory in nature. Electronically Signed   By: Dorethia Molt M.D.   On: 08/31/2024 22:24   DG Chest 2 View Result Date: 08/31/2024 CLINICAL DATA:  Shortness of breath, chest pain EXAM: CHEST - 2 VIEW COMPARISON:  Chest  radiograph November 26, 2023 FINDINGS: The heart size is borderline normal, improved to prior and within normal limits. Mediastinal contours are normal. Bronchial wall thickening. No new consolidation or nodule. No pleural effusion. The visualized skeletal structures are unremarkable. IMPRESSION: Borderline heart size without suspicious new consolidation or pulmonary nodule. Suggestion of bronchial wall thickening. Electronically Signed   By: Megan  Zare M.D.   On: 08/31/2024 19:05    Microbiology: Results for orders placed or performed during the hospital encounter of 08/31/24  SARS Coronavirus 2 by RT PCR (hospital order, performed in [facility] hospital lab) *cepheid single result test* Anterior Nasal Swab     Status: None   Collection Time: 09/01/24 11:05 AM   Specimen: Anterior Nasal Swab  Result Value Ref Range Status   SARS Coronavirus 2 by RT PCR NEGATIVE NEGATIVE Final    Comment: (NOTE) SARS-CoV-2 target nucleic acids are NOT DETECTED.  The SARS-CoV-2 RNA is generally detectable in upper and lower respiratory specimens during the acute phase of infection. The lowest concentration of SARS-CoV-2 viral copies this assay can detect is 250 copies / mL. A negative result does not preclude SARS-CoV-2 infection and should not be used as the sole basis for treatment or other patient management decisions.  A negative result may occur with improper specimen collection / handling, submission of specimen other than nasopharyngeal swab, presence of viral mutation(s) within the areas targeted by this assay, and inadequate number of viral copies (<250 copies / mL). A negative result must be combined with clinical observations, patient history, and epidemiological information.  Fact Sheet for Patients:   RoadLapTop.co.za  Fact Sheet for Healthcare Providers: http://kim-miller.com/  This test is not yet approved or  cleared by the [place]  FDA  and has been authorized for detection and/or diagnosis of SARS-CoV-2 by FDA under an Emergency Use Authorization (EUA).  This EUA will remain in effect (meaning this test can be used) for the duration of the COVID-19 declaration under Section 564(b)(1) of the Act, 21 U.S.C. section 360bbb-3(b)(1), unless the authorization is terminated or revoked sooner.  Performed at [facility], [address], [place], [place] [address]   Respiratory (~20 pathogens) panel by PCR     Status: None   Collection Time: 09/01/24 11:05 AM   Specimen: Nasopharyngeal Swab; Respiratory  Result Value Ref Range Status   Adenovirus NOT DETECTED NOT DETECTED Final   Coronavirus 229E NOT DETECTED NOT DETECTED Final    Comment: (NOTE) The Coronavirus on the Respiratory Panel, DOES NOT test for the novel  Coronavirus (2019 nCoV)    Coronavirus HKU1 NOT DETECTED NOT DETECTED Final   Coronavirus NL63 NOT DETECTED NOT DETECTED Final   Coronavirus OC43 NOT DETECTED NOT DETECTED Final   Metapneumovirus NOT DETECTED NOT DETECTED Final   Rhinovirus / Enterovirus NOT DETECTED NOT DETECTED Final   Influenza A NOT DETECTED NOT DETECTED Final   Influenza B NOT DETECTED NOT DETECTED Final   Parainfluenza Virus 1 NOT DETECTED NOT DETECTED Final   Parainfluenza Virus 2 NOT DETECTED  NOT DETECTED Final   Parainfluenza Virus 3 NOT DETECTED NOT DETECTED Final   Parainfluenza Virus 4 NOT DETECTED NOT DETECTED Final   Respiratory Syncytial Virus NOT DETECTED NOT DETECTED Final   Bordetella pertussis NOT DETECTED NOT DETECTED Final   Bordetella Parapertussis NOT DETECTED NOT DETECTED Final   Chlamydophila pneumoniae NOT DETECTED NOT DETECTED Final   Mycoplasma pneumoniae NOT DETECTED NOT DETECTED Final    Comment: Performed at [facility], [address], [place], [place] [address]    Labs: CBC: Recent Labs  Lab 08/31/24 1814 09/02/24 0400  WBC 9.0 16.7*  HGB 12.7 12.4  HCT 40.6 40.2  MCV  81.0 82.7  PLT 252 260   Basic Metabolic Panel: Recent Labs  Lab 08/31/24 1814 09/02/24 0400  NA 139 137  K 4.0 4.4  CL 104 104  CO2 23 19*  GLUCOSE 88 133*  BUN 9 15  CREATININE 0.73 0.67  CALCIUM 9.2 9.4   Liver Function Tests: No results for input(s): AST, ALT, ALKPHOS, BILITOT, PROT, ALBUMIN in the last 168 hours. CBG: No results for input(s): GLUCAP in the last 168 hours.  Discharge time spent: less than 30 minutes.  Signed: Garnette Pelt, MD [facility] 09/02/2024

## 2024-09-02 NOTE — Plan of Care (Cosign Needed)
 SATURATION QUALIFICATIONS: (This note is used to comply with regulatory documentation for home oxygen)  Patient Saturations on Room Air at Rest = 98%  Patient Saturations on Room Air while Ambulating = 96%  Patient Saturations on 0 Liters of oxygen while Ambulating = N/A  Please briefly explain why patient needs home oxygen:  No oxygen required with this ambulation

## 2024-09-02 NOTE — Progress Notes (Signed)
 NIF -27 with good effort.

## 2024-09-02 NOTE — TOC Initial Note (Signed)
 Transition of Care [facility]) - Initial/Assessment Note    Patient Details  Name: Holly Nielsen MRN: [id number] Date of Birth: 08-05-1991  [facility] CM/SW Contact:    Doneta Glenys DASEN, RN Phone Number: 09/02/2024, 5:33 PM  Clinical Narrative:                 Patient states PTA lives in a house with Ery,Devon (Significant [phone] and minor children;verified PCP/insurance;denies DME, HH,oxygen, SDOH needs and feels safe to return home;will drive self home at discharge. No IP CM needs identified during visit. Please place consult if needs present.  Expected Discharge Plan: Home/Self Care Barriers to Discharge: No Barriers Identified   Patient Goals and CMS Choice Patient states their goals for this hospitalization and ongoing recovery are:: Home with boyfriend CMS Medicare.gov Compare Post Acute Care list provided to::  (NA) Choice offered to / list presented to : NA Christine ownership interest in [facility].provided to:: Parent NA    Expected Discharge Plan and Services In-house Referral: NA Discharge Planning Services: CM Consult Post Acute Care Choice: NA Living arrangements for the past 2 months: Single Family Home                 DME Arranged: N/A DME Agency: NA       HH Arranged: NA HH Agency: NA        Prior Living Arrangements/Services Living arrangements for the past 2 months: Single Family Home Lives with:: Minor Children, Significant Other Patient language and need for interpreter reviewed:: Yes Do you feel safe going back to the place where you live?: Yes      Need for Family Participation in Patient Care: No (Comment) Care giver support system in place?: Yes (comment) Current home services:  (NA) Criminal Activity/Legal Involvement Pertinent to Current Situation/Hospitalization: No - Comment as needed  Activities of Daily Living   ADL Screening (condition at time of admission) Independently performs ADLs?: Yes  (appropriate for developmental age) Is the patient deaf or have difficulty hearing?: No Does the patient have difficulty seeing, even when wearing glasses/contacts?: No Does the patient have difficulty concentrating, remembering, or making decisions?: No  Permission Sought/Granted Permission sought to share information with : Case Manager Permission granted to share information with : Yes, Verbal Permission Granted  Share Information with NAME: Ery,Devon (Significant other)  [phone]           Emotional Assessment Appearance:: Appears stated age Attitude/Demeanor/Rapport: Engaged Affect (typically observed): Appropriate Orientation: : Oriented to Self, Oriented to Place, Oriented to  Time, Oriented to Situation Alcohol / Substance Use: Not Applicable Psych Involvement: No (comment)  Admission diagnosis:  Shortness of breath [R06.02] Severe persistent asthma with exacerbation [J45.51] Acute asthma exacerbation [J45.901] Patient Active Problem List   Diagnosis Date Noted   Acute asthma exacerbation 09/01/2024   Hospital discharge follow-up 06/03/2024   Iron deficiency anemia 06/03/2024   Gardnerella vaginalis infection 04/14/2024   Trichomonas vaginitis 04/14/2024   Vaginal itching 04/13/2024   Fatigue 04/13/2024   Acute viral syndrome 12/24/2023   Prediabetes 12/18/2023   Infertility counseling 12/17/2023   Elevated blood pressure reading without diagnosis of hypertension 12/04/2023   Mild persistent asthma without complication 12/04/2023   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, [address] [place]  [address] Phone: [phone] Fax: [phone]     Social Drivers of Health (SDOH) Social History: SDOH Screenings   Food Insecurity: No Food Insecurity (09/01/2024)  Housing: Low Risk  (09/01/2024)  Transportation Needs: No Transportation Needs (09/01/2024)  Utilities: Not At Risk (09/01/2024)  Depression (PHQ2-9): Low Risk  (06/10/2024)  Recent Concern: Depression (PHQ2-9) - Medium Risk (04/13/2024)  Financial Resource Strain: Low Risk  (06/02/2024)  Physical Activity: Insufficiently Active (06/02/2024)  Social Connections: Socially Isolated (09/01/2024)  Stress: No Stress Concern Present (06/02/2024)  Tobacco Use: Low Risk  (09/01/2024)   SDOH Interventions:     Readmission Risk Interventions    08/03/2023   12:27 PM  Readmission Risk Prevention Plan  Post Dischage Appt Complete  Medication Screening Complete  Transportation Screening Complete

## 2024-09-02 NOTE — Plan of Care (Signed)

## 2024-09-03 ENCOUNTER — Other Ambulatory Visit: Payer: Self-pay

## 2024-09-05 ENCOUNTER — Other Ambulatory Visit: Payer: Self-pay

## 2024-09-13 ENCOUNTER — Other Ambulatory Visit: Payer: Self-pay

## 2024-09-13 ENCOUNTER — Other Ambulatory Visit (HOSPITAL_COMMUNITY): Payer: Self-pay

## 2024-09-23 ENCOUNTER — Encounter: Admitting: General Practice

## 2024-09-24 ENCOUNTER — Other Ambulatory Visit (HOSPITAL_BASED_OUTPATIENT_CLINIC_OR_DEPARTMENT_OTHER): Payer: Self-pay | Admitting: Nurse Practitioner

## 2024-09-26 ENCOUNTER — Other Ambulatory Visit: Payer: Self-pay

## 2024-09-26 MED ORDER — ALBUTEROL SULFATE (2.5 MG/3ML) 0.083% IN NEBU
3.0000 mL | INHALATION_SOLUTION | Freq: Four times a day (QID) | RESPIRATORY_TRACT | 0 refills | Status: DC | PRN
  Filled 2024-09-26: qty 90, 8d supply, fill #0

## 2024-10-06 ENCOUNTER — Encounter: Payer: Self-pay | Admitting: Oncology

## 2024-10-07 ENCOUNTER — Encounter: Admitting: General Practice

## 2024-10-07 DIAGNOSIS — E559 Vitamin D deficiency, unspecified: Secondary | ICD-10-CM

## 2024-10-07 DIAGNOSIS — Z0000 Encounter for general adult medical examination without abnormal findings: Secondary | ICD-10-CM

## 2024-10-14 ENCOUNTER — Inpatient Hospital Stay: Admitting: Oncology

## 2024-10-14 ENCOUNTER — Inpatient Hospital Stay

## 2024-10-14 ENCOUNTER — Ambulatory Visit (HOSPITAL_BASED_OUTPATIENT_CLINIC_OR_DEPARTMENT_OTHER): Admitting: Pulmonary Disease

## 2024-10-21 ENCOUNTER — Other Ambulatory Visit: Payer: Self-pay

## 2024-10-21 ENCOUNTER — Ambulatory Visit (INDEPENDENT_AMBULATORY_CARE_PROVIDER_SITE_OTHER): Admitting: General Practice

## 2024-10-21 ENCOUNTER — Encounter: Payer: Self-pay | Admitting: General Practice

## 2024-10-21 VITALS — BP 118/84 | HR 93 | Temp 98.1°F | Ht 60.0 in | Wt 295.0 lb

## 2024-10-21 DIAGNOSIS — R7303 Prediabetes: Secondary | ICD-10-CM | POA: Diagnosis not present

## 2024-10-21 DIAGNOSIS — J4530 Mild persistent asthma, uncomplicated: Secondary | ICD-10-CM

## 2024-10-21 DIAGNOSIS — Z0000 Encounter for general adult medical examination without abnormal findings: Secondary | ICD-10-CM | POA: Insufficient documentation

## 2024-10-21 DIAGNOSIS — E559 Vitamin D deficiency, unspecified: Secondary | ICD-10-CM | POA: Diagnosis not present

## 2024-10-21 MED ORDER — ALBUTEROL SULFATE (2.5 MG/3ML) 0.083% IN NEBU
3.0000 mL | INHALATION_SOLUTION | Freq: Four times a day (QID) | RESPIRATORY_TRACT | 0 refills | Status: DC | PRN
  Filled 2024-10-21: qty 90, 8d supply, fill #0

## 2024-10-21 NOTE — Progress Notes (Signed)
 Established Patient Office Visit  Subjective   Patient ID: Holly Nielsen, female    DOB: 03/17/1991  Age: 33 y.o. MRN: [id number]  Chief Complaint  Patient presents with   Annual Exam    HPI  Holly Nielsen is a 33 year old female with past medical history of asthma, Guillain-Barre syndrome, Eczema, prediabetes, IDA, ADD, anxiety and depression presents today for complete physical and follow up of chronic conditions.  Immunizations: -Tetanus: states it was within the last 10 years.  -Influenza: declines - Pneumonia: will discuss with pulmonologist. -HPV: unsure  Diet: Fair diet.  Exercise:  regular exercise.  Eye exam: Completed several years ago Dental exam: Completed several years ago  Pap Smear: Completed in 2024  Family history of breast cancer in 2nd cousin- diagnosed in her 32s.    Patient Active Problem List   Diagnosis Date Noted   Encounter for screening and preventative care 10/21/2024   Vitamin D  deficiency 10/21/2024   Acute asthma exacerbation 09/01/2024   Hospital discharge follow-up 06/03/2024   Iron deficiency anemia 06/03/2024   Gardnerella vaginalis infection 04/14/2024   Trichomonas vaginitis 04/14/2024   Vaginal itching 04/13/2024   Fatigue 04/13/2024   Acute viral syndrome 12/24/2023   Prediabetes 12/18/2023   Infertility counseling 12/17/2023   Elevated blood pressure reading without diagnosis of hypertension 12/04/2023   Mild persistent asthma without complication 12/04/2023   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   Morbid obesity with BMI of 50.0-59.9, adult (HCC) 09/16/2021   Anxiety and depression 09/16/2021   History of iron deficiency anemia 09/16/2021   ADD (attention deficit disorder) 02/26/2018   Guillain-Barre syndrome 09/28/2017   Eczema 02/04/2013   Past Medical History:  Diagnosis Date   Acute asthma exacerbation 10/15/2017   Acute respiratory failure with hypoxia (HCC) 11/23/2023   AKI (acute kidney injury) 11/23/2023    Allergic reaction 07/31/2023   Allergy    Amenorrhea 09/16/2021   Anaphylaxis 07/30/2023   Anxiety and depression    Asthma    Chicken pox    Cyst of thyroid  09/16/2021   GERD (gastroesophageal reflux disease)    Guillain-Barre    H/O removal of thyroglossal duct cyst 12/27/2017   Headache    Iron deficiency anemia    Leucocytosis 12/04/2023   Migraine headache with aura    Pica    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   Severe persistent asthma (HCC) 03/13/2022   Ureteral stone with hydronephrosis 11/23/2023   Urinary tract infection    Past Surgical History:  Procedure Laterality Date   APPENDECTOMY     CHOLECYSTECTOMY     SEPTOPLASTY Bilateral 01/15/2024   Procedure: SEPTOPLASTY;  Surgeon: Maggie Hussar, MD;  Location: [facility] OR;  Service: ENT;  Laterality: Bilateral;   thyroid  cyst removal     TONSILLECTOMY     TURBINATE REDUCTION Bilateral 01/15/2024   Procedure: BILATERAL INFERIOR TURBINATE REDUCTION;  Surgeon: Maggie Hussar, MD;  Location: [facility] OR;  Service: ENT;  Laterality: Bilateral;   WISDOM TOOTH EXTRACTION     Allergies  Allergen Reactions   Capsicum Anaphylaxis   Pepcid  [Famotidine ] Anaphylaxis   Buspirone Diarrhea   Other Nausea And Vomiting and Diarrhea    Green bell peppers   Celery Oil Diarrhea   Iron Sucrose Other (See Comments)     Dizziness, Pt became flushed, hot and diaphoretic after completion of infusion.      Nutricap [Actical] Diarrhea   Pedi-Pre Tape Spray [Wound Dressing Adhesive] Hives   Wound Dressings Hives   Anise Extract [Flavoring  Agent (Non-Screening)] Hives and Rash    Vanilla Extract         10/21/2024    2:35 PM 06/10/2024    2:41 PM 06/10/2024    2:33 PM  Depression screen PHQ 2/9  Decreased Interest 0 0 0  Down, Depressed, Hopeless 1 0 0  PHQ - 2 Score 1 0 0  Altered sleeping 2    Tired, decreased energy 1    Change in appetite 0    Feeling bad or failure about yourself  0    Trouble concentrating 0    Moving slowly  or fidgety/restless 0    Suicidal thoughts 0    PHQ-9 Score 4    Difficult doing work/chores Not difficult at all         10/21/2024    2:35 PM 06/03/2024    2:10 PM 04/13/2024   11:58 AM 12/04/2023   11:34 AM  GAD 7 : Generalized Anxiety Score  Nervous, Anxious, on Edge 0 0 1 1  Control/stop worrying 0 0 0 1  Worry too much - different things 0 0 1 2  Trouble relaxing 0 0 1 1  Restless 0 0 0 0  Easily annoyed or irritable 0 0 0 0  Afraid - awful might happen 0 0 1 0  Total GAD 7 Score 0 0 4 5  Anxiety Difficulty Not difficult at all Not difficult at all Somewhat difficult Not difficult at all      Review of Systems  Constitutional:  Negative for chills, fever, malaise/fatigue and weight loss.  HENT:  Negative for congestion, ear discharge, ear pain, hearing loss, nosebleeds, sinus pain, sore throat and tinnitus.   Eyes:  Negative for blurred vision, double vision, pain, discharge and redness.  Respiratory:  Negative for cough, shortness of breath, wheezing and stridor.   Cardiovascular:  Negative for chest pain, palpitations and leg swelling.  Gastrointestinal:  Negative for abdominal pain, constipation, diarrhea, heartburn, nausea and vomiting.  Genitourinary:  Negative for dysuria, frequency and urgency.  Musculoskeletal:  Negative for myalgias.  Skin:  Negative for rash.  Neurological:  Negative for dizziness, tingling, seizures, weakness and headaches.  Psychiatric/Behavioral:  Negative for depression, substance abuse and suicidal ideas. The patient is not nervous/anxious.       Objective:     BP 118/84   Pulse 93   Temp 98.1 F (36.7 C) (Oral)   Ht 5' (1.524 m)   Wt 295 lb (133.8 kg)   SpO2 98%   BMI 57.61 kg/m  BP Readings from Last 3 Encounters:  10/21/24 118/84  09/02/24 134/84  07/13/24 (!) 158/110   Wt Readings from Last 3 Encounters:  10/21/24 295 lb (133.8 kg)  09/01/24 293 lb 1.6 oz (132.9 kg)  07/13/24 280 lb (127 kg)      Physical  Exam Vitals and nursing note reviewed.  Constitutional:      Appearance: Normal appearance.  HENT:     Head: Normocephalic and atraumatic.     Right Ear: Tympanic membrane, ear canal and external ear normal.     Left Ear: Tympanic membrane, ear canal and external ear normal.     Nose: Nose normal.     Mouth/Throat:     Mouth: Mucous membranes are moist.     Pharynx: Oropharynx is clear.  Eyes:     Conjunctiva/sclera: Conjunctivae normal.     Pupils: Pupils are equal, round, and reactive to light.  Cardiovascular:     Rate and Rhythm:  Normal rate and regular rhythm.     Pulses: Normal pulses.     Heart sounds: Normal heart sounds.  Pulmonary:     Effort: Pulmonary effort is normal.     Breath sounds: Normal breath sounds.  Abdominal:     General: Abdomen is flat. Bowel sounds are normal.     Palpations: Abdomen is soft.  Musculoskeletal:        General: Normal range of motion.     Cervical back: Normal range of motion.  Skin:    General: Skin is warm and dry.     Capillary Refill: Capillary refill takes less than 2 seconds.  Neurological:     General: No focal deficit present.     Mental Status: She is alert and oriented to person, place, and time. Mental status is at baseline.  Psychiatric:        Mood and Affect: Mood normal.        Behavior: Behavior normal.        Thought Content: Thought content normal.        Judgment: Judgment normal.      No results found for any visits on 10/21/24.     The ASCVD Risk score (Arnett DK, et al., 2019) failed to calculate for the following reasons:   The 2019 ASCVD risk score is only valid for ages 80 to 25    Assessment & Plan:  Encounter for screening and preventative care Assessment & Plan: Immunizations declines Pap smear UTD.  Discussed the importance of a healthy diet and regular exercise in order for weight loss, and to reduce the risk of further co-morbidity.  Exam stable. Labs pending.  Follow up in 1 year  for repeat physical.    Mild persistent asthma without complication Assessment & Plan: Following by pulmonologist.   Refill sent albuterol  neb.   Reviewed notes from Pulmonologist.  Orders: -     Albuterol  Sulfate; Take 3 mLs by nebulization every 6 (six) hours as needed for wheezing. Contact Doctor for more refills.  Dispense: 90 mL; Refill: 0  Vitamin D  deficiency Assessment & Plan: Vitamin d  pending.  Orders: -     VITAMIN D  25 Hydroxy (Vit-D Deficiency, Fractures)  Prediabetes Assessment & Plan: Discussed the importance of monitoring your diet and exercise.      Return in about 2 weeks (around 11/04/2024) for anxiety.    Carrol Aurora, NP

## 2024-10-21 NOTE — Assessment & Plan Note (Signed)
 Following by pulmonologist.   Refill sent albuterol  neb.   Reviewed notes from Pulmonologist.

## 2024-10-21 NOTE — Assessment & Plan Note (Signed)
 Discussed the importance of monitoring your diet and exercise.

## 2024-10-21 NOTE — Assessment & Plan Note (Signed)
 Vitamin d pending

## 2024-10-21 NOTE — Patient Instructions (Addendum)
 Your recent lab tests have resulted, and my comments are displayed below. Please do not hesitate to reach out with any questions!  Schedule f/u regarding anxiety at your convenience.   Follow up in 6 months for chronic care management.   It was a pleasure to see you today!

## 2024-10-21 NOTE — Assessment & Plan Note (Signed)
 Immunizations declines Pap smear UTD.  Discussed the importance of a healthy diet and regular exercise in order for weight loss, and to reduce the risk of further co-morbidity.  Exam stable. Labs pending.  Follow up in 1 year for repeat physical.

## 2024-10-22 LAB — VITAMIN D 25 HYDROXY (VIT D DEFICIENCY, FRACTURES): Vit D, 25-Hydroxy: 15 ng/mL — ABNORMAL LOW (ref 30–100)

## 2024-10-24 ENCOUNTER — Ambulatory Visit: Payer: Self-pay | Admitting: General Practice

## 2024-10-24 ENCOUNTER — Other Ambulatory Visit (HOSPITAL_COMMUNITY): Payer: Self-pay

## 2024-10-24 ENCOUNTER — Other Ambulatory Visit: Payer: Self-pay

## 2024-10-24 DIAGNOSIS — E559 Vitamin D deficiency, unspecified: Secondary | ICD-10-CM

## 2024-10-24 MED ORDER — VITAMIN D (ERGOCALCIFEROL) 1.25 MG (50000 UNIT) PO CAPS
50000.0000 [IU] | ORAL_CAPSULE | ORAL | 0 refills | Status: AC
  Filled 2024-10-24 – 2025-01-25 (×2): qty 12, 84d supply, fill #0

## 2024-10-30 ENCOUNTER — Other Ambulatory Visit: Payer: Self-pay | Admitting: General Practice

## 2024-10-30 DIAGNOSIS — J4530 Mild persistent asthma, uncomplicated: Secondary | ICD-10-CM

## 2024-10-31 ENCOUNTER — Encounter: Payer: Self-pay | Admitting: Radiology

## 2024-11-01 ENCOUNTER — Other Ambulatory Visit: Payer: Self-pay

## 2024-11-01 ENCOUNTER — Encounter: Payer: Self-pay | Admitting: Nurse Practitioner

## 2024-11-01 ENCOUNTER — Inpatient Hospital Stay

## 2024-11-01 ENCOUNTER — Inpatient Hospital Stay: Attending: Oncology

## 2024-11-01 ENCOUNTER — Other Ambulatory Visit (HOSPITAL_COMMUNITY): Payer: Self-pay

## 2024-11-01 ENCOUNTER — Inpatient Hospital Stay (HOSPITAL_BASED_OUTPATIENT_CLINIC_OR_DEPARTMENT_OTHER): Admitting: Nurse Practitioner

## 2024-11-01 VITALS — BP 133/102 | HR 75 | Temp 98.3°F | Resp 16 | Wt 296.0 lb

## 2024-11-01 DIAGNOSIS — E538 Deficiency of other specified B group vitamins: Secondary | ICD-10-CM

## 2024-11-01 DIAGNOSIS — D509 Iron deficiency anemia, unspecified: Secondary | ICD-10-CM | POA: Diagnosis not present

## 2024-11-01 LAB — IRON AND TIBC
Iron: 46 ug/dL (ref 28–170)
Saturation Ratios: 15 % (ref 10.4–31.8)
TIBC: 318 ug/dL (ref 250–450)
UIBC: 272 ug/dL

## 2024-11-01 LAB — VITAMIN B12: Vitamin B-12: 289 pg/mL (ref 180–914)

## 2024-11-01 LAB — CBC (CANCER CENTER ONLY)
HCT: 38.1 % (ref 36.0–46.0)
Hemoglobin: 12.6 g/dL (ref 12.0–15.0)
MCH: 26.9 pg (ref 26.0–34.0)
MCHC: 33.1 g/dL (ref 30.0–36.0)
MCV: 81.4 fL (ref 80.0–100.0)
Platelet Count: 235 K/uL (ref 150–400)
RBC: 4.68 MIL/uL (ref 3.87–5.11)
RDW: 14.2 % (ref 11.5–15.5)
WBC Count: 8.5 K/uL (ref 4.0–10.5)
nRBC: 0 % (ref 0.0–0.2)

## 2024-11-01 LAB — FERRITIN: Ferritin: 89 ng/mL (ref 11–307)

## 2024-11-01 MED ORDER — ALBUTEROL SULFATE (2.5 MG/3ML) 0.083% IN NEBU
3.0000 mL | INHALATION_SOLUTION | Freq: Four times a day (QID) | RESPIRATORY_TRACT | 0 refills | Status: DC | PRN
  Filled 2024-11-01 (×2): qty 90, 8d supply, fill #0

## 2024-11-01 NOTE — Progress Notes (Signed)
No B12 today.

## 2024-11-01 NOTE — Progress Notes (Signed)
 Hematology/Oncology Consult Note [facility] [phone] [phone]  Patient Care Team: Vincente Shivers, NP as PCP - General (General Practice) Melanee Annah BROCKS, MD as Consulting Physician (Oncology)   Name of the patient: Holly Nielsen  [id number]  1991-07-03   Reason for referral-iron deficiency anemia   Referring physician- Shivers Vincente NP   Date of visit: 11/01/24  History of presenting illness- patient is a 33 year old female with a past medical history significant for anxiety and depression, history of eczema referred for iron deficiency anemia.  Labs from June 2025 showed an H&H of 11.2/35.9 with an MCV of 79.8.  White count and platelets were normal.  Her baseline hemoglobin runs between 12-13.5.  Iron studies showed a ferritin of 77 with an iron saturation of 9%.  Patient reports that her menstrual cycles last for 6 to 7 days but are not particularly heavy other than the first couple of days.  Denies any consistent use of NSAIDs.  Denies any blood loss in her stool or urine.  Denies any family history of colon cancer.  She has received IV iron in the past but states that she had a reaction to Venofer.  She experienced flushing and nausea  Interval History: Patient is a 33 year old female who returns to clinic for routine follow-up of iron deficiency anemia.  Her energy level improved for approximately 1 week after receiving Monoferric  then dropped again.  She has found that her vitamin D  level was low despite replacement Denies any neurologic complaints. Denies recent fevers or illnesses. Denies any easy bleeding or bruising. No melena or hematochezia. No pica or restless leg. Reports good appetite and denies weight loss. Denies chest pain. Denies any nausea, vomiting, constipation, or diarrhea. Denies urinary complaints. Patient offers no further specific complaints today.  ECOG PS- 0  Pain scale- 0  Review of systems- Review of Systems   Constitutional:  Positive for malaise/fatigue. Negative for chills, fever and weight loss.  HENT:  Negative for congestion, ear discharge and nosebleeds.   Eyes:  Negative for blurred vision.  Respiratory:  Negative for cough, hemoptysis, sputum production, shortness of breath and wheezing.   Cardiovascular:  Negative for chest pain, palpitations, orthopnea and claudication.  Gastrointestinal:  Negative for abdominal pain, blood in stool, constipation, diarrhea, heartburn, melena, nausea and vomiting.  Genitourinary:  Negative for dysuria, flank pain, frequency, hematuria and urgency.  Musculoskeletal:  Negative for back pain, joint pain and myalgias.  Skin:  Negative for rash.  Neurological:  Negative for dizziness, tingling, focal weakness, seizures, weakness and headaches.  Endo/Heme/Allergies:  Does not bruise/bleed easily.  Psychiatric/Behavioral:  Negative for depression and suicidal ideas. The patient does not have insomnia.    Allergies  Allergen Reactions   Capsicum Anaphylaxis   Pepcid  [Famotidine ] Anaphylaxis   Buspirone Diarrhea   Other Nausea And Vomiting and Diarrhea    Green bell peppers   Celery Oil Diarrhea   Iron Sucrose Other (See Comments)     Dizziness, Pt became flushed, hot and diaphoretic after completion of infusion.      Nutricap [Actical] Diarrhea   Pedi-Pre Tape Spray [Wound Dressing Adhesive] Hives   Wound Dressings Hives   Anise Extract [Flavoring Agent (Non-Screening)] Hives and Rash    Vanilla Extract   Patient Active Problem List   Diagnosis Date Noted   Encounter for screening and preventative care 10/21/2024   Vitamin D  deficiency 10/21/2024   Acute asthma exacerbation 09/01/2024   Hospital discharge follow-up 06/03/2024   Iron  deficiency anemia 06/03/2024   Gardnerella vaginalis infection 04/14/2024   Trichomonas vaginitis 04/14/2024   Vaginal itching 04/13/2024   Fatigue 04/13/2024   Acute viral syndrome 12/24/2023   Prediabetes  12/18/2023   Infertility counseling 12/17/2023   Elevated blood pressure reading without diagnosis of hypertension 12/04/2023   Mild persistent asthma without complication 12/04/2023   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   Morbid obesity with BMI of 50.0-59.9, adult (HCC) 09/16/2021   Anxiety and depression 09/16/2021   History of iron deficiency anemia 09/16/2021   ADD (attention deficit disorder) 02/26/2018   Guillain-Barre syndrome 09/28/2017   Eczema 02/04/2013   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   Severe persistent asthma (HCC) 03/13/2022   Ureteral stone with hydronephrosis 11/23/2023   Urinary tract infection    Past Surgical History:  Procedure Laterality Date   APPENDECTOMY     CHOLECYSTECTOMY     SEPTOPLASTY Bilateral 01/15/2024   Procedure: SEPTOPLASTY;  Surgeon: Maggie Hussar, MD;  Location: [facility] OR;  Service: ENT;  Laterality: Bilateral;   thyroid  cyst removal     TONSILLECTOMY     TURBINATE REDUCTION Bilateral 01/15/2024   Procedure: BILATERAL INFERIOR TURBINATE REDUCTION;  Surgeon: Maggie Hussar, MD;  Location: [facility] OR;  Service: ENT;  Laterality: Bilateral;   WISDOM TOOTH EXTRACTION     Social History   Socioeconomic History   Marital status: Single    Spouse name: Not on file   Number of children: 0   Years of education: Not on file   Highest education level: Associate degree: occupational,  scientist, product/process development, or vocational program  Occupational History   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    Social Drivers of Corporate Investment Banker Strain: Low Risk  (06/02/2024)   Overall Financial Resource Strain (CARDIA)    Difficulty of Paying Living Expenses: Not very hard  Food Insecurity: No Food Insecurity (09/01/2024)   Hunger Vital Sign    Worried About Running Out of Food in the Last Year: Never true    Ran Out of Food in the Last Year: Never true  Transportation Needs: No Transportation Needs (09/01/2024)   PRAPARE - Administrator, Civil Service (Medical): No    Lack of Transportation (Non-Medical): No  Physical Activity: Insufficiently Active (06/02/2024)   Exercise Vital Sign    Days of Exercise per Week: 5 days    Minutes of Exercise per Session: 20 min  Stress: No  Stress Concern Present (06/02/2024)   [employer] of Occupational Health - Occupational Stress Questionnaire    Feeling of Stress : Not at all  Social Connections: Socially Isolated (09/01/2024)   Social Connection and Isolation Panel    Frequency of Communication with Friends and Family: More than three times a week    Frequency of Social Gatherings with Friends and Family: More than three times a week    Attends Religious Services: Never    Database Administrator or Organizations: No    Attends Banker Meetings: Never    Marital Status: Never married  Intimate Partner Violence: Not At Risk (09/01/2024)   Humiliation, Afraid, Rape, and Kick questionnaire    Fear of Current or Ex-Partner: No    Emotionally Abused: No    Physically Abused: No     Sexually Abused: No    Family History  Problem Relation Age of Onset   Learning disabilities Father    Hypertension Father    Hyperlipidemia Father    Diabetes Father    Asthma Father    Hypertension Paternal Grandmother    Hyperlipidemia Paternal Grandmother    Diabetes Paternal Grandmother    Diabetes Mellitus I Paternal Grandmother    Hypertension Paternal Grandfather    Hyperlipidemia Paternal Grandfather    Diabetes Paternal Grandfather    Stroke Paternal Grandfather    Heart attack Paternal Grandfather    Leukemia Paternal Great-grandfather     Current Outpatient Medications:    albuterol  (PROVENTIL ) (2.5 MG/3ML) 0.083% nebulizer solution, Take 3 mLs by nebulization every 6 (six) hours as needed for wheezing. Contact Doctor for more refills., Disp: 90 mL, Rfl: 0   EPINEPHrine  (PRIMATENE  MIST) 0.125 MG/ACT AERO, Inhale 1 puff into the lungs daily as needed (shortness of breath)., Disp: , Rfl:    EPINEPHrine  0.3 mg/0.3 mL IJ SOAJ injection, Inject 0.3 mg into the muscle as needed for anaphylaxis., Disp: 2 each, Rfl: 5   Fluticasone -Umeclidin-Vilant (TRELEGY ELLIPTA ) 200-62.5-25 MCG/ACT AEPB, Inhale 1 puff into the lungs daily. Rinse mouth after use., Disp: 60 each, Rfl: 8   pantoprazole  (PROTONIX ) 40 MG tablet, Take 1 tablet (40 mg total) by mouth daily., Disp: 30 tablet, Rfl: 0   PARoxetine  (PAXIL -CR) 25 MG 24 hr tablet, Take 1 tablet (25 mg total) by mouth every morning., Disp: 30 tablet, Rfl: 0   Vitamin D , Ergocalciferol , (DRISDOL ) 1.25 MG (50000 UNIT) CAPS capsule, Take 1 capsule (50,000 Units total) by mouth every 7 (seven) days., Disp: 12 capsule, Rfl: 0   Physical exam:  Vitals:   11/01/24 1516 11/01/24 1520  BP: (!) 146/90 (!) 133/102  Pulse: 75   Resp: 16   Temp: 98.3 F (36.8 C)   TempSrc: Tympanic   SpO2: 97%   Weight: 296 lb (134.3 kg)    Physical Exam Vitals reviewed.  Cardiovascular:     Rate and Rhythm: Normal rate and regular rhythm.  Pulmonary:      Effort: Pulmonary effort is normal.  Abdominal:     Palpations: Abdomen is soft.  Skin:    General: Skin is warm and dry.     Coloration: Skin is not pale.  Neurological:     Mental Status: She is alert and oriented to person, place, and time.  Psychiatric:        Mood and Affect: Mood normal.        Behavior: Behavior normal.       Latest Ref Rng & Units 11/01/2024  2:52 PM 09/02/2024    4:00 AM 08/31/2024    6:14 PM  CBC  WBC 4.0 - 10.5 K/uL 8.5  16.7  9.0   Hemoglobin 12.0 - 15.0 g/dL 87.3  87.5  87.2   Hematocrit 36.0 - 46.0 % 38.1  40.2  40.6   Platelets 150 - 400 K/uL 235  260  252       Latest Ref Rng & Units 09/02/2024    4:00 AM 08/31/2024    6:14 PM 07/13/2024   11:04 AM  CMP  Glucose 70 - 99 mg/dL 866  88  850   BUN 6 - 20 mg/dL 15  9  12    Creatinine 0.44 - 1.00 mg/dL 9.32  9.26  9.22   Sodium 135 - 145 mmol/L 137  139  141   Potassium 3.5 - 5.1 mmol/L 4.4  4.0  3.7   Chloride 98 - 111 mmol/L 104  104  108   CO2 22 - 32 mmol/L 19  23  24    Calcium 8.9 - 10.3 mg/dL 9.4  9.2  9.2   Total Protein 6.5 - 8.1 g/dL   7.0   Total Bilirubin 0.0 - 1.2 mg/dL   0.4   Alkaline Phos 38 - 126 U/L   66   AST 15 - 41 U/L   20   ALT 0 - 44 U/L   23    Iron/TIBC/Ferritin/ %Sat    Component Value Date/Time   IRON 46 11/01/2024 1452   TIBC 318 11/01/2024 1452   FERRITIN 89 11/01/2024 1452   IRONPCTSAT 15 11/01/2024 1452   IRONPCTSAT 9 (L) 06/03/2024 1445   Lab Results  Component Value Date   VITAMINB12 289 11/01/2024    Assessment and plan- Patient is a 33 y.o. female who returns to clinic for follow-up of:   Anemia-mild.  June 2005.  Hemoglobin 11.2.  Iron saturation was low at 9%.  Ferritin was 110.  She is status post 1 dose of Monoferric  on 06/17/2024.  She received 40 mg of IV Solu-Medrol  as a premedication.  tolerated well without significant side effects.  Hemoglobin today is 12.6.  Microcytosis is resolved.  Ferritin and iron studies are pending at time of visit.   Ferritin 89, iron sat 15%. Improved. Recommend holding IV iron today.  If iron deficiency anemia continues to be a problem I will refer her to GI in the future. Anemia- We are checking her B12 levels today and if diminished would consider starting B12 injections.   Vitamin D  deficiency-managed by PCP.  Currently on 50,000 units weekly.  I recommended that she follow-up with her PCP for any dose adjustments.  We discussed about finding of vitamin D , leg with calcium, and sun exposure.  Disposition: 4 months-labs (CBC, CMP, ferritin, iron studies, B12), Dr. Melanee- la  Visit Diagnosis 1. Iron deficiency anemia, unspecified iron deficiency anemia type    Tinnie Dawn, DNP, AGNP-C, AOCNP [facility] [phone] (clinic) 11/01/2024

## 2024-11-02 ENCOUNTER — Ambulatory Visit: Payer: Self-pay | Admitting: Oncology

## 2024-11-04 ENCOUNTER — Ambulatory Visit: Admitting: General Practice

## 2024-11-04 DIAGNOSIS — F32A Depression, unspecified: Secondary | ICD-10-CM

## 2024-11-07 ENCOUNTER — Telehealth

## 2024-11-07 DIAGNOSIS — R079 Chest pain, unspecified: Secondary | ICD-10-CM

## 2024-11-07 DIAGNOSIS — R051 Acute cough: Secondary | ICD-10-CM

## 2024-11-08 ENCOUNTER — Telehealth: Payer: Self-pay | Admitting: Oncology

## 2024-11-08 ENCOUNTER — Ambulatory Visit
Admission: EM | Admit: 2024-11-08 | Discharge: 2024-11-08 | Disposition: A | Discharge status: Home / Self Care | Attending: Emergency Medicine | Admitting: Emergency Medicine

## 2024-11-08 DIAGNOSIS — B349 Viral infection, unspecified: Secondary | ICD-10-CM | POA: Diagnosis not present

## 2024-11-08 LAB — POC COVID19/FLU A&B COMBO
Covid Antigen, POC: NEGATIVE
Influenza A Antigen, POC: NEGATIVE
Influenza B Antigen, POC: NEGATIVE

## 2024-11-08 NOTE — Discharge Instructions (Addendum)
 The COVID and flu tests are negative.   Take Tylenol or ibuprofen as needed for fever or discomfort.  Take plain Mucinex as needed for congestion.  Rest and keep yourself hydrated.    Follow-up with your primary care provider if your symptoms are not improving.

## 2024-11-08 NOTE — ED Provider Notes (Signed)
 CAY RALPH PELT    CSN: [id number] Arrival date & time: 11/08/24  9171      History   Chief Complaint Chief Complaint  Patient presents with   Generalized Body Aches   Nasal Congestion   Headache    HPI Holly Nielsen is a 33 y.o. female.  Patient presents with 1 day history of elevated temperature, body aches, headache, congestion, cough, vomiting.  Tmax 99.9 this morning.  Patient took Tylenol .  1 episode of emesis yesterday; none today.  She has been able to eat and drink this morning without vomiting.  Patient states this does not feel like an asthma exacerbation; she has no wheezing or shortness of breath.  She last used her albuterol  nebulizer this morning.  No chest pain, shortness of breath, diarrhea.  The history is provided by the patient and medical records.    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   Severe persistent asthma (HCC) 03/13/2022   Ureteral stone with hydronephrosis 11/23/2023   Urinary tract infection     Patient Active Problem List   Diagnosis Date Noted   Encounter for screening and preventative care 10/21/2024   Vitamin D  deficiency 10/21/2024   Acute asthma exacerbation 09/01/2024   Hospital discharge follow-up 06/03/2024   Iron deficiency anemia 06/03/2024   Gardnerella vaginalis infection 04/14/2024   Trichomonas vaginitis 04/14/2024   Vaginal itching 04/13/2024   Fatigue  04/13/2024   Acute viral syndrome 12/24/2023   Prediabetes 12/18/2023   Infertility counseling 12/17/2023   Elevated blood pressure reading without diagnosis of hypertension 12/04/2023   Mild persistent asthma without complication 12/04/2023   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   Morbid obesity with BMI of 50.0-59.9, adult (HCC) 09/16/2021   Anxiety and depression 09/16/2021   History of iron deficiency anemia 09/16/2021   ADD (attention deficit disorder) 02/26/2018   Guillain-Barre syndrome 09/28/2017   Eczema 02/04/2013    Past Surgical History:  Procedure Laterality Date   APPENDECTOMY     CHOLECYSTECTOMY     SEPTOPLASTY Bilateral 01/15/2024   Procedure: SEPTOPLASTY;  Surgeon: Maggie Hussar, MD;  Location: [facility] OR;  Service: ENT;  Laterality: Bilateral;   thyroid  cyst removal     TONSILLECTOMY     TURBINATE REDUCTION Bilateral 01/15/2024   Procedure: BILATERAL INFERIOR TURBINATE REDUCTION;  Surgeon: Maggie Hussar, MD;  Location: [facility] OR;  Service: ENT;  Laterality: Bilateral;   WISDOM TOOTH EXTRACTION      OB History     Gravida  0   Para  0   Term  0   Preterm  0   AB  0   Living  0      SAB  0   IAB  0   Ectopic  0   Multiple  0   Live Births  0  Home Medications    Prior to Admission medications   Medication Sig Start Date End Date Taking? Authorizing Provider  albuterol  (PROVENTIL ) (2.5 MG/3ML) 0.083% nebulizer solution Take 3 mLs by nebulization every 6 (six) hours as needed for wheezing. Contact Doctor for more refills. 11/01/24  Yes Vincente Shivers, NP  EPINEPHrine  (PRIMATENE  MIST) 0.125 MG/ACT AERO Inhale 1 puff into the lungs daily as needed (shortness of breath).    [provider]  EPINEPHrine  0.3 mg/0.3 mL IJ SOAJ injection Inject 0.3 mg into the muscle as needed for anaphylaxis. 08/14/23   Kassie Acquanetta Bradley, MD  Fluticasone -Umeclidin-Vilant (TRELEGY ELLIPTA ) 200-62.5-25 MCG/ACT AEPB Inhale 1 puff into the lungs  daily. Rinse mouth after use. 04/04/24   Kassie Acquanetta Bradley, MD  pantoprazole  (PROTONIX ) 40 MG tablet Take 1 tablet (40 mg total) by mouth daily. 09/03/24 11/01/24  Cindy Garnette POUR, MD  PARoxetine  (PAXIL -CR) 25 MG 24 hr tablet Take 1 tablet (25 mg total) by mouth every morning. 11/29/23   Tobie Yetta HERO, MD  Vitamin D , Ergocalciferol , (DRISDOL ) 1.25 MG (50000 UNIT) CAPS capsule Take 1 capsule (50,000 Units total) by mouth every 7 (seven) days. 10/24/24   Vincente Shivers, NP    Family History Family History  Problem Relation Age of Onset   Learning disabilities Father    Hypertension Father    Hyperlipidemia Father    Diabetes Father    Asthma Father    Hypertension Paternal Grandmother    Hyperlipidemia Paternal Grandmother    Diabetes Paternal Grandmother    Diabetes Mellitus I Paternal Grandmother    Hypertension Paternal Grandfather    Hyperlipidemia Paternal Grandfather    Diabetes Paternal Grandfather    Stroke Paternal Grandfather    Heart attack Paternal Grandfather    Leukemia Paternal Great-grandfather     Social History Social History   Tobacco Use   Smoking status: Never   Smokeless tobacco: Never  Vaping Use   Vaping status: Never Used  Substance Use Topics   Alcohol use: Never   Drug use: Never     Allergies   Capsicum, Pepcid  [famotidine ], Buspirone, Other, Celery oil, Iron sucrose, Nutricap [actical], Pedi-pre tape spray [wound dressing adhesive], Wound dressings, and Anise extract [flavoring agent (non-screening)]   Review of Systems Review of Systems  Constitutional:  Positive for fever. Negative for chills.  HENT:  Positive for congestion and sore throat. Negative for ear pain.   Respiratory:  Positive for cough. Negative for shortness of breath and wheezing.   Cardiovascular:  Negative for chest pain and palpitations.  Gastrointestinal:  Positive for vomiting. Negative for abdominal pain and diarrhea.  Neurological:  Positive for headaches.      Physical Exam Triage Vital Signs ED Triage Vitals  Encounter Vitals Group     BP      Girls Systolic BP Percentile      Girls Diastolic BP Percentile      Boys Systolic BP Percentile      Boys Diastolic BP Percentile      Pulse      Resp      Temp      Temp src      SpO2      Weight      Height      Head Circumference      Peak Flow      Pain Score      Pain Loc      Pain Education      Exclude from Growth Chart  No data found.  Updated Vital Signs BP 134/84   Pulse 86   Temp 98 F (36.7 C)   Resp 18   LMP 11/01/2024   SpO2 98%   Visual Acuity Right Eye Distance:   Left Eye Distance:   Bilateral Distance:    Right Eye Near:   Left Eye Near:    Bilateral Near:     Physical Exam Constitutional:      General: She is not in acute distress.    Appearance: She is obese.  HENT:     Right Ear: Tympanic membrane normal.     Left Ear: Tympanic membrane normal.     Nose: Nose normal.     Mouth/Throat:     Mouth: Mucous membranes are moist.     Pharynx: Oropharynx is clear.  Cardiovascular:     Rate and Rhythm: Normal rate and regular rhythm.     Heart sounds: Normal heart sounds.  Pulmonary:     Effort: Pulmonary effort is normal. No respiratory distress.     Breath sounds: Normal breath sounds. No wheezing.  Abdominal:     General: Bowel sounds are normal.     Palpations: Abdomen is soft.     Tenderness: There is no abdominal tenderness. There is no guarding or rebound.  Neurological:     Mental Status: She is alert.      UC Treatments / Results  Labs (all labs ordered are listed, but only abnormal results are displayed) Labs Reviewed  POC COVID19/FLU A&B COMBO - Normal    EKG   Radiology No results found.  Procedures Procedures (including critical care time)  Medications Ordered in UC Medications - No data to display  Initial Impression / Assessment and Plan / UC Course  I have reviewed the triage vital signs and the nursing  notes.  Pertinent labs & imaging results that were available during my care of the patient were reviewed by me and considered in my medical decision making (see chart for details).    Viral illness.  Afebrile and vital signs are stable.  Lungs are clear and O2 sat is 98% on room air.  Patient reports no wheezing or shortness of breath.  Rapid COVID and flu negative.  Discussed symptomatic treatment including Tylenol  or ibuprofen as needed for fever or discomfort, plain Mucinex  as needed for congestion, rest, hydration.  Instructed patient to follow-up with her PCP if she is not improving.  ED precautions given.  Patient agrees to plan of care.  Final Clinical Impressions(s) / UC Diagnoses   Final diagnoses:  Viral illness     Discharge Instructions      The COVID and flu tests are negative.   Take Tylenol  or ibuprofen as needed for fever or discomfort.  Take plain Mucinex  as needed for congestion.  Rest and keep yourself hydrated.    Follow-up with your primary care provider if your symptoms are not improving.         ED Prescriptions   None    PDMP not reviewed this encounter.   Corlis Burnard DEL, NP 11/08/24 [phone]

## 2024-11-08 NOTE — Progress Notes (Signed)
  Because of note of constant chest/abdominal pain with symptoms, I feel your condition warrants further evaluation and I recommend that you be seen in a face-to-face visit.   NOTE: There will be NO CHARGE for this E-Visit   If you are having a true medical emergency, please call 911.     For an urgent face to face visit, [facility] has multiple urgent care centers for your convenience.  Click the link below for the full list of locations and hours, walk-in wait times, appointment scheduling options and driving directions:  Urgent Care - [place], [place], [place], [place], [place], [place]  [place]     Your MyChart E-visit questionnaire answers were reviewed by a board certified advanced clinical practitioner to complete your personal care plan based on your specific symptoms.    Thank you for using e-Visits.

## 2024-11-08 NOTE — ED Triage Notes (Signed)
 Patient to Urgent Care with complaints of nasal congestion/ body aches and soreness/ headaches/ chest congestion/ emesis/ temp 99.9/ cough.  Symptoms started yesterday.   Taking tylenol / albuterol  breathing treatment this morning.

## 2024-11-09 ENCOUNTER — Encounter: Payer: Self-pay | Admitting: Oncology

## 2024-11-10 ENCOUNTER — Other Ambulatory Visit: Payer: Self-pay

## 2024-11-10 ENCOUNTER — Other Ambulatory Visit (HOSPITAL_COMMUNITY): Payer: Self-pay

## 2024-11-10 ENCOUNTER — Encounter: Payer: Self-pay | Admitting: General Practice

## 2024-11-10 ENCOUNTER — Emergency Department (HOSPITAL_COMMUNITY)

## 2024-11-10 ENCOUNTER — Emergency Department (HOSPITAL_COMMUNITY)
Admission: EM | Admit: 2024-11-10 | Discharge: 2024-11-10 | Disposition: A | Discharge status: Home / Self Care | Attending: Emergency Medicine | Admitting: Emergency Medicine

## 2024-11-10 DIAGNOSIS — R9431 Abnormal electrocardiogram [ECG] [EKG]: Secondary | ICD-10-CM

## 2024-11-10 DIAGNOSIS — R0602 Shortness of breath: Secondary | ICD-10-CM | POA: Diagnosis not present

## 2024-11-10 DIAGNOSIS — R059 Cough, unspecified: Secondary | ICD-10-CM | POA: Diagnosis present

## 2024-11-10 DIAGNOSIS — J4530 Mild persistent asthma, uncomplicated: Secondary | ICD-10-CM

## 2024-11-10 DIAGNOSIS — J069 Acute upper respiratory infection, unspecified: Secondary | ICD-10-CM | POA: Insufficient documentation

## 2024-11-10 DIAGNOSIS — B9789 Other viral agents as the cause of diseases classified elsewhere: Secondary | ICD-10-CM | POA: Diagnosis not present

## 2024-11-10 DIAGNOSIS — J45901 Unspecified asthma with (acute) exacerbation: Secondary | ICD-10-CM | POA: Insufficient documentation

## 2024-11-10 DIAGNOSIS — Z7951 Long term (current) use of inhaled steroids: Secondary | ICD-10-CM | POA: Insufficient documentation

## 2024-11-10 LAB — BASIC METABOLIC PANEL WITH GFR
Anion gap: 12 (ref 5–15)
BUN: 12 mg/dL (ref 6–20)
CO2: 24 mmol/L (ref 22–32)
Calcium: 9.7 mg/dL (ref 8.9–10.3)
Chloride: 105 mmol/L (ref 98–111)
Creatinine, Ser: 0.77 mg/dL (ref 0.44–1.00)
GFR, Estimated: >60 mL/min (ref >60)
Glucose, Bld: 99 mg/dL (ref 70–99)
Potassium: 3.9 mmol/L (ref 3.5–5.1)
Sodium: 140 mmol/L (ref 135–145)

## 2024-11-10 LAB — RESP PANEL BY RT-PCR (RSV, FLU A&B, COVID)  RVPGX2
Influenza A by PCR: NEGATIVE
Influenza B by PCR: NEGATIVE
Resp Syncytial Virus by PCR: NEGATIVE
SARS Coronavirus 2 by RT PCR: NEGATIVE

## 2024-11-10 LAB — CBC
HCT: 38.7 % (ref 36.0–46.0)
Hemoglobin: 12.4 g/dL (ref 12.0–15.0)
MCH: 26.7 pg (ref 26.0–34.0)
MCHC: 32 g/dL (ref 30.0–36.0)
MCV: 83.4 fL (ref 80.0–100.0)
Platelets: 241 K/uL (ref 150–400)
RBC: 4.64 MIL/uL (ref 3.87–5.11)
RDW: 14.7 % (ref 11.5–15.5)
WBC: 9 K/uL (ref 4.0–10.5)
nRBC: 0 % (ref 0.0–0.2)

## 2024-11-10 LAB — HCG, SERUM, QUALITATIVE: Preg, Serum: NEGATIVE

## 2024-11-10 MED ORDER — ALBUTEROL SULFATE HFA 108 (90 BASE) MCG/ACT IN AERS
2.0000 | INHALATION_SPRAY | RESPIRATORY_TRACT | 3 refills | Status: DC | PRN
  Filled 2024-11-10: qty 1, fill #0
  Filled 2024-11-10 (×2): qty 18, 25d supply, fill #0
  Filled 2024-11-30: qty 18, 25d supply, fill #1

## 2024-11-10 MED ORDER — PREDNISONE 20 MG PO TABS
60.0000 mg | ORAL_TABLET | Freq: Once | ORAL | Status: AC
  Administered 2024-11-10: 60 mg via ORAL
  Filled 2024-11-10: qty 3

## 2024-11-10 MED ORDER — ALBUTEROL SULFATE (2.5 MG/3ML) 0.083% IN NEBU
3.0000 mL | INHALATION_SOLUTION | Freq: Four times a day (QID) | RESPIRATORY_TRACT | 3 refills | Status: DC | PRN
  Filled 2024-11-10 (×3): qty 90, 8d supply, fill #0
  Filled 2024-11-23: qty 90, 8d supply, fill #1
  Filled 2024-11-23: qty 90, 8d supply, fill #0
  Filled 2024-11-30: qty 90, 8d supply, fill #1
  Filled 2024-12-08: qty 90, 8d supply, fill #2

## 2024-11-10 MED ORDER — PREDNISONE 50 MG PO TABS
50.0000 mg | ORAL_TABLET | Freq: Every day | ORAL | 0 refills | Status: AC
  Filled 2024-11-10 (×3): qty 5, 5d supply, fill #0

## 2024-11-10 MED ORDER — IPRATROPIUM-ALBUTEROL 0.5-2.5 (3) MG/3ML IN SOLN
3.0000 mL | Freq: Once | RESPIRATORY_TRACT | Status: AC
  Administered 2024-11-10: 3 mL via RESPIRATORY_TRACT
  Filled 2024-11-10: qty 3

## 2024-11-10 NOTE — Telephone Encounter (Signed)
 Copied from CRM #[id number]. Topic: Referral - Request for Referral >> Nov 10, 2024  4:50 PM Jasmin G wrote: Did the patient discuss referral with their provider in the last year? Yes (If No - schedule appointment) (If Yes - send message)  Appointment offered? No  Type of order/referral and detailed reason for visit: Cardiologist due to recent EKG results  Preference of office, provider, location: [facility] in [place]  If referral order, have you been seen by this specialty before? No (If Yes, this issue or another issue? When? Where?  Can we respond through MyChart? Yes

## 2024-11-10 NOTE — Discharge Instructions (Signed)
 Continue using your nebulizer or inhaler every 4-6 hours as needed.  Return if symptoms or not being adequately controlled at home.

## 2024-11-10 NOTE — ED Triage Notes (Signed)
 Patient brought in by EMS with c/o shortness of breath since Monday night. Patient went to urgent care Tuesday however feel she is getting worse. Patient has a history of asthma.

## 2024-11-10 NOTE — ED Provider Notes (Signed)
 [place] EMERGENCY DEPARTMENT AT [facility] Provider Note   CSN: [id number] Arrival date & time: 11/10/24  9783     Patient presents with: Shortness of Breath   Holly Nielsen is a 33 y.o. female.   The history is provided by the patient.  Shortness of Breath  She has history of asthma, GERD, Guillain-Barr syndrome and comes in because of ongoing cough and shortness of breath.  She states that she started getting sick 3 days ago with nonproductive cough and dyspnea.  She was seen at an urgent care center 2 days ago at which time COVID and flu tests were negative and she was advised to use over-the-counter cough and cold medication.  Cough has been getting worse but continues to be nonproductive.  She states the wheezing and shortness of breath are also getting worse.  Her home nebulizer gives her slight relief, but does not last for very long.  She has had low-grade fevers but denies chills or sweats.  She denies arthralgias or myalgias.  She denies any sick contacts.    Prior to Admission medications   Medication Sig Start Date End Date Taking? Authorizing Provider  albuterol  (PROVENTIL ) (2.5 MG/3ML) 0.083% nebulizer solution Take 3 mLs by nebulization every 6 (six) hours as needed for wheezing. Contact Doctor for more refills. 11/01/24   Vincente Shivers, NP  EPINEPHrine  (PRIMATENE  MIST) 0.125 MG/ACT AERO Inhale 1 puff into the lungs daily as needed (shortness of breath).    [provider]  EPINEPHrine  0.3 mg/0.3 mL IJ SOAJ injection Inject 0.3 mg into the muscle as needed for anaphylaxis. 08/14/23   Kassie Acquanetta Bradley, MD  Fluticasone -Umeclidin-Vilant (TRELEGY ELLIPTA ) 200-62.5-25 MCG/ACT AEPB Inhale 1 puff into the lungs daily. Rinse mouth after use. 04/04/24   Kassie Acquanetta Bradley, MD  pantoprazole  (PROTONIX ) 40 MG tablet Take 1 tablet (40 mg total) by mouth daily. 09/03/24 11/01/24  Cindy Garnette POUR, MD  PARoxetine  (PAXIL -CR) 25 MG 24 hr tablet Take 1 tablet (25 mg total)  by mouth every morning. 11/29/23   Tobie Yetta HERO, MD  Vitamin D , Ergocalciferol , (DRISDOL ) 1.25 MG (50000 UNIT) CAPS capsule Take 1 capsule (50,000 Units total) by mouth every 7 (seven) days. 10/24/24   Vincente Shivers, NP    Allergies: Capsicum, Pepcid  [famotidine ], Buspirone, Other, Celery oil, Iron sucrose, Nutricap [actical], Pedi-pre tape spray [wound dressing adhesive], Wound dressings, and Anise extract [flavoring agent (non-screening)]    Review of Systems  Respiratory:  Positive for shortness of breath.   All other systems reviewed and are negative.   Updated Vital Signs BP (!) 143/90 (BP Location: Left Arm)   Pulse 94   Temp 98.1 F (36.7 C) (Oral)   Resp (!) 21   Ht 5' (1.524 m)   Wt 127 kg   LMP 11/01/2024   SpO2 92%   BMI 54.68 kg/m   Physical Exam Vitals and nursing note reviewed.   33 year old female, resting comfortably and in no acute distress. Vital signs are significant for slightly elevated respiratory rate and slightly elevated blood pressure. Oxygen saturation is 92%, which is normal. Head is normocephalic and atraumatic. PERRLA, EOMI.  Lungs have a prolonged exhalation phase with faint expiratory wheezes.  There are no rales or rhonchi. Chest is nontender. Heart has regular rate and rhythm without murmur. Abdomen is soft, flat, nontender. Skin is warm and dry without rash. Neurologic: Awake and alert, moves all extremities equally.  (all labs ordered are listed, but only abnormal results are  displayed) Labs Reviewed  RESP PANEL BY RT-PCR (RSV, FLU A&B, COVID)  [id number]  BASIC METABOLIC PANEL WITH GFR  CBC  HCG, SERUM, QUALITATIVE    EKG: EKG Interpretation Date/Time:  Thursday November 10 2024 02:23:51 EST Ventricular Rate:  93 PR Interval:  126 QRS Duration:  97 QT Interval:  408 QTC Calculation: 508 R Axis:   59  Text Interpretation: Sinus rhythm Low voltage, precordial leads Borderline repolarization abnormality Prolonged QT interval When  compared with ECG of 08/31/2024, Nonspecific T wave abnormality is now present Confirmed by Raford Lenis ([id number]) on 11/10/2024 2:36:29 AM  Radiology: DG Chest 2 View Result Date: 11/10/2024 CLINICAL DATA:  Shortness of breath EXAM: CHEST - 2 VIEW COMPARISON:  08/31/2024 FINDINGS: The heart size and mediastinal contours are within normal limits. Both lungs are clear. The visualized skeletal structures are unremarkable. IMPRESSION: No active cardiopulmonary disease. Electronically Signed   By: Oneil Devonshire M.D.   On: 11/10/2024 02:46     Procedures   Medications Ordered in the ED - No data to display                                  Medical Decision Making Amount and/or Complexity of Data Reviewed Labs: ordered. Radiology: ordered.  Risk Prescription drug management.   Respiratory tract infection with exacerbation of asthma.  I have reviewed her old records, and do note urgent care visit on 11/08/2024 at which time flu and COVID antigen test were negative.  I also note a hospitalization on 08/31/2024 for asthma exacerbation.  Chest x-ray shows no evidence of pneumonia.  I have independently viewed the images, and agree with the radiologist's interpretation.  I have reviewed her laboratory tests, and my interpretation is normal basic metabolic panel, normal CBC, not pregnant, negative PCR for COVID-19 and influenza and RSV.  I have ordered a dose of prednisone  and a nebulizer treatment with albuterol  and ipratropium.  History of treatment, patient noted partial improvement but still felt like she was having some difficulty breathing.  On reexam, there was faint expiratory wheezing.  I ordered an additional nebulizer treatment.  Following this, patient felt like she was back at her baseline and lungs were clear.  I am discharging her with prescriptions for prednisone  and refilling her prescription for albuterol  solution for nebulizer.  I am also giving her prescription for albuterol  inhaler.   Follow-up with PCP.  Return precautions discussed.     Final diagnoses:  Exacerbation of asthma, unspecified asthma severity, unspecified whether persistent  Viral URI    ED Discharge Orders          Ordered    albuterol  (PROVENTIL ) (2.5 MG/3ML) 0.083% nebulizer solution  Every 6 hours PRN       Note to Pharmacy: Dx: J45.50   11/10/24 0718    predniSONE  (DELTASONE ) 50 MG tablet  Daily        11/10/24 0718    albuterol  (VENTOLIN  HFA) 108 (90 Base) MCG/ACT inhaler  Every 4 hours PRN        11/10/24 0718               Raford Lenis, MD 11/10/24 [phone]

## 2024-11-11 ENCOUNTER — Other Ambulatory Visit: Payer: Self-pay | Admitting: Oncology

## 2024-11-11 ENCOUNTER — Inpatient Hospital Stay

## 2024-11-11 DIAGNOSIS — D508 Other iron deficiency anemias: Secondary | ICD-10-CM

## 2024-11-11 MED ORDER — CYANOCOBALAMIN 1000 MCG/ML IJ SOLN
1000.0000 ug | INTRAMUSCULAR | Status: DC
  Administered 2024-11-11: 1000 ug via INTRAMUSCULAR
  Filled 2024-11-11: qty 1

## 2024-11-11 NOTE — Addendum Note (Signed)
 Addended by: VINCENTE SHIVERS on: 11/11/2024 11:41 AM   Modules accepted: Orders

## 2024-11-18 ENCOUNTER — Ambulatory Visit: Admitting: General Practice

## 2024-11-23 ENCOUNTER — Ambulatory Visit (HOSPITAL_BASED_OUTPATIENT_CLINIC_OR_DEPARTMENT_OTHER): Admitting: Pulmonary Disease

## 2024-11-23 ENCOUNTER — Other Ambulatory Visit: Payer: Self-pay

## 2024-11-30 ENCOUNTER — Other Ambulatory Visit (HOSPITAL_COMMUNITY): Payer: Self-pay

## 2024-11-30 ENCOUNTER — Other Ambulatory Visit: Payer: Self-pay

## 2024-12-01 ENCOUNTER — Other Ambulatory Visit: Payer: Self-pay

## 2024-12-01 ENCOUNTER — Other Ambulatory Visit (HOSPITAL_BASED_OUTPATIENT_CLINIC_OR_DEPARTMENT_OTHER): Payer: Self-pay

## 2024-12-02 ENCOUNTER — Ambulatory Visit: Attending: Cardiology | Admitting: Cardiology

## 2024-12-02 ENCOUNTER — Encounter: Payer: Self-pay | Admitting: Cardiology

## 2024-12-02 VITALS — BP 159/98 | HR 83 | Ht 61.0 in | Wt 299.8 lb

## 2024-12-02 DIAGNOSIS — R9431 Abnormal electrocardiogram [ECG] [EKG]: Secondary | ICD-10-CM

## 2024-12-02 DIAGNOSIS — R030 Elevated blood-pressure reading, without diagnosis of hypertension: Secondary | ICD-10-CM

## 2024-12-02 NOTE — Patient Instructions (Signed)
 Medication Instructions:  Your physician recommends that you continue on your current medications as directed. Please refer to the Current Medication list given to you today.   *If you need a refill on your cardiac medications before your next appointment, please call your pharmacy*  Lab Work: No labs ordered today  If you have labs (blood work) drawn today and your tests are completely normal, you will receive your results only by: MyChart Message (if you have MyChart) OR A paper copy in the mail If you have any lab test that is abnormal or we need to change your treatment, we will call you to review the results.  Testing/Procedures: Your physician has requested that you have an echocardiogram. Echocardiography is a painless test that uses sound waves to create images of your heart. It provides your doctor with information about the size and shape of your heart and how well your heart's chambers and valves are working.   You may receive an ultrasound enhancing agent through an IV if needed to better visualize your heart during the echo. This procedure takes approximately one hour.  There are no restrictions for this procedure.  This will take place at [address] [facility] [address], [place] [address]  Please note: We ask at that you not bring children with you during ultrasound (echo/ vascular) testing. Due to room size and safety concerns, children are not allowed in the ultrasound rooms during exams. Our front office staff cannot provide observation of children in our lobby area while testing is being conducted. An adult accompanying a patient to their appointment will only be allowed in the ultrasound room at the discretion of the ultrasound technician under special circumstances. We apologize for any inconvenience.   Follow-Up: At [facility], you and your health needs are our priority.  As part of our continuing mission to provide you with exceptional heart  care, our providers are all part of one team.  This team includes your primary Cardiologist (physician) and Advanced Practice Providers or APPs (Physician Assistants and Nurse Practitioners) who all work together to provide you with the care you need, when you need it.  Your next appointment:   3 month(s)  Provider:   You may see Dr. Darliss or one of the following Advanced Practice Providers on your designated Care Team:   Lonni Meager, NP Lesley Maffucci, PA-C Bernardino Bring, PA-C Cadence [place], PA-C Tylene Lunch, NP Barnie Hila, NP    We recommend signing up for the patient portal called MyChart.  Sign up information is provided on this After Visit Summary.  MyChart is used to connect with patients for Virtual Visits (Telemedicine).  Patients are able to view lab/test results, encounter notes, upcoming appointments, etc.  Non-urgent messages can be sent to your provider as well.   To learn more about what you can do with MyChart, go to [email or web address].

## 2024-12-02 NOTE — Progress Notes (Signed)
 Cardiology Office Note:    Date:  12/02/2024   ID:  Holly Nielsen, DOB [birth year], MRN [id number]  PCP:  Holly Shivers, NP   [facility] Providers Cardiologist:  None     Referring MD: Holly Shivers, NP   Chief Complaint  Patient presents with   Follow-up    1 month follow up pt has been doing well with no complaints of chest pain, chest pressure or SOB, medciation reviewed verbally with patient    History of Present Illness:    Holly Nielsen is a 33 y.o. female with a hx of asthma, GERD, Guillain-Barr syndrome presenting due to abnormal ECG.  Patient had an ECG obtained about 3 weeks ago after being admitted for an asthma exacerbation, ECG was noted to be abnormal, stating low voltage.  She denies any immediate family history of heart disease.  Endorses shortness of breath with overexertion.  Denies smoking, previously vape.  Endorse snoring, states being tested for sleep apnea which came back normal.  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   Severe persistent asthma (HCC) 03/13/2022   Ureteral stone with hydronephrosis 11/23/2023   Urinary tract infection     Past Surgical History:  Procedure Laterality Date   APPENDECTOMY     CHOLECYSTECTOMY     SEPTOPLASTY Bilateral 01/15/2024   Procedure: SEPTOPLASTY;  Surgeon: Maggie Hussar, MD;  Location: [facility] OR;  Service: ENT;  Laterality: Bilateral;   thyroid   cyst removal     TONSILLECTOMY     TURBINATE REDUCTION Bilateral 01/15/2024   Procedure: BILATERAL INFERIOR TURBINATE REDUCTION;  Surgeon: Maggie Hussar, MD;  Location: [facility] OR;  Service: ENT;  Laterality: Bilateral;   WISDOM TOOTH EXTRACTION      Current Medications: Current Meds  Medication Sig   albuterol  (PROVENTIL ) (2.5 MG/3ML) 0.083% nebulizer solution Take 3 mLs by nebulization every 6 (six) hours as needed for wheezing. Contact Doctor for more refills.   albuterol  (VENTOLIN  HFA) 108 (90 Base) MCG/ACT inhaler Inhale 2 puffs into the lungs every 4 (four) hours as needed for wheezing or shortness of breath.   EPINEPHrine  (PRIMATENE  MIST) 0.125 MG/ACT AERO Inhale 1 puff into the lungs daily as needed (shortness of breath).   EPINEPHrine  0.3 mg/0.3 mL IJ SOAJ injection Inject 0.3 mg into the muscle as needed for anaphylaxis.   Fluticasone -Umeclidin-Vilant (TRELEGY ELLIPTA ) 200-62.5-25 MCG/ACT AEPB Inhale 1 puff into the lungs daily. Rinse mouth after use.   pantoprazole  (PROTONIX ) 40 MG tablet Take 1 tablet (40 mg total) by mouth daily.   PARoxetine  (PAXIL -CR) 25 MG 24 hr tablet Take 1 tablet (25 mg total) by mouth every morning.   predniSONE  (DELTASONE ) 50 MG tablet Take 1 tablet (50 mg total) by mouth daily.   Vitamin D , Ergocalciferol , (DRISDOL ) 1.25 MG (50000 UNIT) CAPS capsule Take 1 capsule (50,000 Units total) by mouth every 7 (seven) days.  Allergies:   Capsicum, Pepcid  [famotidine ], Buspirone, Other, Celery oil, Iron sucrose, Nutricap [actical], Pedi-pre tape spray [wound dressing adhesive], Wound dressings, and Anise extract [flavoring agent (non-screening)]   Social History   Socioeconomic History   Marital status: Single    Spouse name: Not on file   Number of children: 0   Years of education: Not on file   Highest education level: Associate degree: occupational, scientist, product/process development, or vocational program  Occupational History   Occupation: box car - artist  Tobacco  Use   Smoking status: Never   Smokeless tobacco: Never  Vaping Use   Vaping status: Never Used  Substance and Sexual Activity   Alcohol use: Never   Drug use: Never   Sexual activity: Yes    Partners: Male    Birth control/protection: None  Other Topics Concern   Not on file  Social History Narrative   Lives with her boyfriend.    Social Drivers of Corporate Investment Banker Strain: Low Risk  (06/02/2024)   Overall Financial Resource Strain (CARDIA)    Difficulty of Paying Living Expenses: Not very hard  Food Insecurity: No Food Insecurity (09/01/2024)   Hunger Vital Sign    Worried About Running Out of Food in the Last Year: Never true    Ran Out of Food in the Last Year: Never true  Transportation Needs: No Transportation Needs (09/01/2024)   PRAPARE - Administrator, Civil Service (Medical): No    Lack of Transportation (Non-Medical): No  Physical Activity: Insufficiently Active (06/02/2024)   Exercise Vital Sign    Days of Exercise per Week: 5 days    Minutes of Exercise per Session: 20 min  Stress: No Stress Concern Present (06/02/2024)   [employer] of Occupational Health - Occupational Stress Questionnaire    Feeling of Stress : Not at all  Social Connections: Socially Isolated (09/01/2024)   Social Connection and Isolation Panel    Frequency of Communication with Friends and Family: More than three times a week    Frequency of Social Gatherings with Friends and Family: More than three times a week    Attends Religious Services: Never    Database Administrator or Organizations: No    Attends Engineer, Structural: Never    Marital Status: Never married     Family History: The patient's family history includes Asthma in her father; Diabetes in her father, paternal grandfather, and paternal grandmother; Diabetes Mellitus I in her paternal grandmother; Heart attack in her paternal grandfather; Hyperlipidemia in her father, paternal grandfather, and  paternal grandmother; Hypertension in her father, paternal grandfather, and paternal grandmother; Learning disabilities in her father; Leukemia in her paternal great-grandfather; Stroke in her paternal grandfather.  ROS:   Please see the history of present illness.     All other systems reviewed and are negative.  EKGs/Labs/Other Studies Reviewed:    The following studies were reviewed today:       Recent Labs: 12/17/2023: TSH 1.090 07/13/2024: ALT 23 11/10/2024: BUN 12; Creatinine, Ser 0.77; Hemoglobin 12.4; Platelets 241; Potassium 3.9; Sodium 140  Recent Lipid Panel No results found for: CHOL, TRIG, HDL, CHOLHDL, VLDL, LDLCALC, LDLDIRECT   Risk Assessment/Calculations:         Physical Exam:    VS:  BP (!) 159/98 (BP Location: Left Arm, Patient Position: Sitting, Cuff Size: Normal)   Pulse 83   Ht 5' 1 (1.549 m)   Wt 299 lb 12.8 oz (136 kg)  LMP 11/01/2024   SpO2 93%   BMI 56.65 kg/m     Wt Readings from Last 3 Encounters:  12/02/24 299 lb 12.8 oz (136 kg)  11/10/24 280 lb (127 kg)  11/01/24 296 lb (134.3 kg)     GEN:  Well nourished, well developed in no acute distress HEENT: Normal NECK: No JVD; No carotid bruits CARDIAC: RRR, no murmurs, rubs, gallops RESPIRATORY: Diminished breath sounds, no wheezing ABDOMEN: Soft, non-tender, non-distended MUSCULOSKELETAL:  No edema; No deformity  SKIN: Warm and dry NEUROLOGIC:  Alert and oriented x 3 PSYCHIATRIC:  Normal affect   ASSESSMENT:    1. Nonspecific abnormal electrocardiogram (ECG) (EKG)   2. Morbid obesity (HCC)   3. Elevated blood pressure reading without diagnosis of hypertension    PLAN:    In order of problems listed above:  EKG abnormality, low voltage noted on ECG, likely from morbid obesity/adipose tissue on chest wall.  Also endorses some shortness of breath with overexertion which can be attributable to being morbidly obese.  Obtain echo to rule out any significant structural  abnormalities. Morbid obesity, low-calorie diet, weight loss advised. BP elevated, usually controlled.  Monitor off BP medications.  Low-salt diet advised.  Follow-up after echo     Medication Adjustments/Labs and Tests Ordered: Current medicines are reviewed at length with the patient today.  Concerns regarding medicines are outlined above.  Orders Placed This Encounter  Procedures   ECHOCARDIOGRAM COMPLETE   No orders of the defined types were placed in this encounter.   Patient Instructions  Medication Instructions:  Your physician recommends that you continue on your current medications as directed. Please refer to the Current Medication list given to you today.   *If you need a refill on your cardiac medications before your next appointment, please call your pharmacy*  Lab Work: No labs ordered today  If you have labs (blood work) drawn today and your tests are completely normal, you will receive your results only by: MyChart Message (if you have MyChart) OR A paper copy in the mail If you have any lab test that is abnormal or we need to change your treatment, we will call you to review the results.  Testing/Procedures: Your physician has requested that you have an echocardiogram. Echocardiography is a painless test that uses sound waves to create images of your heart. It provides your doctor with information about the size and shape of your heart and how well your heart's chambers and valves are working.   You may receive an ultrasound enhancing agent through an IV if needed to better visualize your heart during the echo. This procedure takes approximately one hour.  There are no restrictions for this procedure.  This will take place at [address] [facility] [address], [place] [address]  Please note: We ask at that you not bring children with you during ultrasound (echo/ vascular) testing. Due to room size and safety concerns, children are not allowed in the  ultrasound rooms during exams. Our front office staff cannot provide observation of children in our lobby area while testing is being conducted. An adult accompanying a patient to their appointment will only be allowed in the ultrasound room at the discretion of the ultrasound technician under special circumstances. We apologize for any inconvenience.   Follow-Up: At [facility], you and your health needs are our priority.  As part of our continuing mission to provide you with exceptional heart care, our providers are all part of one  team.  This team includes your primary Cardiologist (physician) and Advanced Practice Providers or APPs (Physician Assistants and Nurse Practitioners) who all work together to provide you with the care you need, when you need it.  Your next appointment:   3 month(s)  Provider:   You may see Dr Darliss or one of the following Advanced Practice Providers on your designated Care Team:   Lonni Meager, NP Lesley Maffucci, PA-C Bernardino Bring, PA-C Cadence [place], PA-C Tylene Lunch, NP Barnie Hila, NP    We recommend signing up for the patient portal called MyChart.  Sign up information is provided on this After Visit Summary.  MyChart is used to connect with patients for Virtual Visits (Telemedicine).  Patients are able to view lab/test results, encounter notes, upcoming appointments, etc.  Non-urgent messages can be sent to your provider as well.   To learn more about what you can do with MyChart, go to [email or web address].              Signed, Redell Darliss, MD  12/02/2024 4:35 PM    [facility]

## 2024-12-07 ENCOUNTER — Telehealth (HOSPITAL_COMMUNITY): Payer: Self-pay

## 2024-12-07 ENCOUNTER — Other Ambulatory Visit (HOSPITAL_COMMUNITY): Payer: Self-pay

## 2024-12-07 NOTE — Telephone Encounter (Signed)
 Medication: Trelegy Able to fill? Yes Prior authorization required? Yes Co-pay before assistance: N/A

## 2024-12-08 ENCOUNTER — Other Ambulatory Visit: Payer: Self-pay

## 2024-12-08 ENCOUNTER — Telehealth: Payer: Self-pay

## 2024-12-08 ENCOUNTER — Other Ambulatory Visit (HOSPITAL_COMMUNITY): Payer: Self-pay

## 2024-12-08 NOTE — Telephone Encounter (Signed)
*  Pulm   Pharmacy Patient Advocate Encounter   Received notification from RX Request Messages that prior authorization for Trelegy  is required/requested.   Insurance verification completed.   The patient is insured through [facility].   Per test claim: PA required; PA started via CoverMyMeds. KEY [id number] . Waiting for clinical questions to populate.

## 2024-12-09 ENCOUNTER — Other Ambulatory Visit: Payer: Self-pay

## 2024-12-09 NOTE — Telephone Encounter (Signed)
 Your request has been approved Request Reference Number: [id number]. TRELEGY AER is approved through 12/09/2025. For further questions, call [employer] at [phone]. Authorization Expiration12/11/2025

## 2024-12-09 NOTE — Telephone Encounter (Signed)
 Questions populated and submitted, pending determination

## 2024-12-09 NOTE — Telephone Encounter (Signed)
 I called and spoke to pt. Pt informed of the rx approval and verbalized understanding. Pt also wanted to schedule a f/u appt with Dr Kassie. Pt has ben scheduled for 12-13-24. NFN

## 2024-12-13 ENCOUNTER — Ambulatory Visit (HOSPITAL_BASED_OUTPATIENT_CLINIC_OR_DEPARTMENT_OTHER): Admitting: Pulmonary Disease

## 2024-12-16 ENCOUNTER — Inpatient Hospital Stay: Attending: Oncology

## 2024-12-16 DIAGNOSIS — D508 Other iron deficiency anemias: Secondary | ICD-10-CM

## 2024-12-16 DIAGNOSIS — E538 Deficiency of other specified B group vitamins: Secondary | ICD-10-CM | POA: Diagnosis present

## 2024-12-16 MED ORDER — CYANOCOBALAMIN 1000 MCG/ML IJ SOLN
1000.0000 ug | INTRAMUSCULAR | Status: DC
  Administered 2024-12-16: 1000 ug via INTRAMUSCULAR
  Filled 2024-12-16: qty 1

## 2025-01-01 ENCOUNTER — Telehealth (HOSPITAL_COMMUNITY): Payer: Self-pay

## 2025-01-01 ENCOUNTER — Other Ambulatory Visit (HOSPITAL_COMMUNITY): Payer: Self-pay

## 2025-01-02 ENCOUNTER — Other Ambulatory Visit (HOSPITAL_COMMUNITY): Payer: Self-pay

## 2025-01-02 ENCOUNTER — Other Ambulatory Visit: Payer: Self-pay

## 2025-01-02 NOTE — Telephone Encounter (Signed)
No PA needed at this time ?

## 2025-01-06 ENCOUNTER — Ambulatory Visit: Attending: Cardiology

## 2025-01-06 DIAGNOSIS — R9431 Abnormal electrocardiogram [ECG] [EKG]: Secondary | ICD-10-CM | POA: Diagnosis present

## 2025-01-06 LAB — ECHOCARDIOGRAM COMPLETE
AR max vel: 2.94 cm2
AV Area VTI: 3.03 cm2
AV Area mean vel: 2.78 cm2
AV Mean grad: 3 mmHg
AV Peak grad: 6.3 mmHg
Ao pk vel: 1.25 m/s
Area-P 1/2: 3.99 cm2
S' Lateral: 2.88 cm

## 2025-01-09 ENCOUNTER — Ambulatory Visit: Payer: Self-pay | Admitting: Cardiology

## 2025-01-20 ENCOUNTER — Inpatient Hospital Stay

## 2025-01-20 ENCOUNTER — Other Ambulatory Visit: Payer: Self-pay

## 2025-01-20 ENCOUNTER — Encounter (HOSPITAL_BASED_OUTPATIENT_CLINIC_OR_DEPARTMENT_OTHER): Payer: Self-pay | Admitting: Pulmonary Disease

## 2025-01-20 ENCOUNTER — Ambulatory Visit (INDEPENDENT_AMBULATORY_CARE_PROVIDER_SITE_OTHER): Admitting: Pulmonary Disease

## 2025-01-20 ENCOUNTER — Telehealth: Payer: Self-pay

## 2025-01-20 VITALS — BP 128/66 | HR 80 | Temp 98.1°F | Resp 24

## 2025-01-20 DIAGNOSIS — Z87892 Personal history of anaphylaxis: Secondary | ICD-10-CM | POA: Diagnosis not present

## 2025-01-20 DIAGNOSIS — R911 Solitary pulmonary nodule: Secondary | ICD-10-CM

## 2025-01-20 DIAGNOSIS — J4550 Severe persistent asthma, uncomplicated: Secondary | ICD-10-CM

## 2025-01-20 DIAGNOSIS — Z8669 Personal history of other diseases of the nervous system and sense organs: Secondary | ICD-10-CM | POA: Diagnosis not present

## 2025-01-20 MED ORDER — IPRATROPIUM-ALBUTEROL 0.5-2.5 (3) MG/3ML IN SOLN
3.0000 mL | Freq: Four times a day (QID) | RESPIRATORY_TRACT | 5 refills | Status: AC | PRN
  Filled 2025-01-20 – 2025-01-25 (×2): qty 360, 30d supply, fill #0

## 2025-01-20 MED ORDER — ALBUTEROL SULFATE HFA 108 (90 BASE) MCG/ACT IN AERS
2.0000 | INHALATION_SPRAY | RESPIRATORY_TRACT | 5 refills | Status: AC | PRN
  Filled 2025-01-20: qty 18, fill #0
  Filled 2025-01-25: qty 6.7, 25d supply, fill #0

## 2025-01-20 NOTE — Patient Instructions (Signed)
--  CONTINUE Xolair  for now. Will need to notify [facility] once approved for Nucala --Will enroll Nucala --CONTINUE Trelegy 200 ONE puff ONCE a day --CONTINUE zyrtec  10 mg daily

## 2025-01-20 NOTE — Progress Notes (Signed)
 "   Subjective:   PATIENT ID: Holly Nielsen GENDER: female DOB: 03/07/1991, MRN: [id number]   HPI  Chief Complaint  Patient presents with   Asthma    Reason for Visit: Follow-up asthma  Ms. Holly Nielsen is a 34 year old female with childhood/adult asthma, morbid obesity, history of Guillain-Barr syndrome in 2017, ADD, anxiety/depression who presents for asthma.  Since September 2022 she has had 5 ED visits for viral illnesses/respiratory complaints including COVID 19 in January 2023.  She was briefly hospitalized from 3/16 to 03/14/2022 for asthma exacerbation requiring nebulizers IV steroids.  She was discharged on prednisone  and Advair  pulmonary referral placed.  She was seen again in the ED on 04/12/2022 after running out of nebulizer solution and Advair  and having symptoms of shortness of breath and wheezing  She has moved from [facility] to [facility]. Previously on Xolair , last taken in 2021. At baseline she has shortness of breath, cough and wheezing. Symptoms triggered by allergens and illness. Able to walk and push through her symptoms. Is a server. Compliant with Advair  250. Uses albuterol  several times a day. She walks her dog daily 2 miles.  09/22/22 Since our last visit she has been seen in urgent care for asthma flare in June and had RSV virus earlier this month in September and treated with prednisone  taper. She reports she has not been able to make Xolair  appointments due to death in family and had to caregiver to grandmother. She is ready to focus on her health now. She is compliant with her Advair . Has some cough and wheezing requiring albuterol  twice a day.  04/04/24 She was seen at [facility] by NP Parrettt on 11/02/23 for asthma; Trelegy and Xolair . Denies shortness of breath, cough or wheezing. Last exacerbation one month ago. Since our last visit she has completed PFTs. Did not complete post-bronchodilator portion due to concern for reported anaphylactic reaction (swelling and  itching). She was seen at [facility] ED for influenza on 02/27/24. Also had septoplasty and turbinate reduction on 01/15/24 for deviated nasal septum and this has helped with her asthma symptoms. She is currently seeing Allergy at [facility] for anaphylaxis and allergies; currently unclear triggers. Occurred during August, October, Dec 2024.   01/20/25 Since our last visit she has been seen 3 times in the ED for acute asthma exacerbations in August, September, and November 2025 in which she received decadron , prednisone , and nebulizer treatments with each visit. She became short of breath and was unable to catch her breath. She tried her emergency inhaler at home and was out of her albuterol  nebulizer. Imaging was obtained. She also had one episode of anaphylaxis in 05/31/24 requiring epi-pen with unknown trigger.  She endorses being short of breath with activity such as getting up to grab an item and long periods of walking such as grocery shopping. She will have to use her albuterol  inhaler twice a week. She states she wakes with dyspnea up to two times weekly in which she will use her inhaler for relief. Denies wheezing, cough. Compliant with medications, but feels as if she needs the albuterol  or DuoNeb.  Asthma Control Test ACT Total Score  01/20/2025  9:46 AM 15  11/02/2023  2:40 PM 8  10/22/2022  1:09 PM 6   Social History: Never smoker  Past Medical History:  Diagnosis Date   Acute asthma exacerbation 10/15/2017   Acute respiratory failure with hypoxia (HCC) 11/23/2023   AKI (acute kidney injury) 11/23/2023   Allergic reaction 07/31/2023  Allergy    Amenorrhea 09/16/2021   Anaphylaxis 07/30/2023   Anxiety and depression    Asthma    Chicken pox    Cyst of thyroid  09/16/2021   GERD (gastroesophageal reflux disease)    Guillain-Barre    H/O removal of thyroglossal duct cyst 12/27/2017   Headache    Iron deficiency anemia    Leucocytosis 12/04/2023   Migraine headache with aura     Pica    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   Severe persistent asthma (HCC) 03/13/2022   Ureteral stone with hydronephrosis 11/23/2023   Urinary tract infection      Family History  Problem Relation Age of Onset   Learning disabilities Father    Hypertension Father    Hyperlipidemia Father    Diabetes Father    Asthma Father    Hypertension Paternal Grandmother    Hyperlipidemia Paternal Grandmother    Diabetes Paternal Grandmother    Diabetes Mellitus I Paternal Grandmother    Hypertension Paternal Grandfather    Hyperlipidemia Paternal Grandfather    Diabetes Paternal Grandfather    Stroke Paternal Grandfather    Heart attack Paternal Grandfather    Leukemia Paternal Great-grandfather      Social History   Occupational History   Occupation: box car - artist  Tobacco Use   Smoking status: Never   Smokeless tobacco: Never  Vaping Use   Vaping status: Never Used  Substance and Sexual Activity   Alcohol use: Never   Drug use: Never   Sexual activity: Yes    Partners: Male    Birth control/protection: None    Allergies  Allergen Reactions   Capsicum Anaphylaxis   Pepcid  [Famotidine ] Anaphylaxis   Buspirone Diarrhea   Other Nausea And Vomiting and Diarrhea    Green bell peppers   Celery (Apium Graveolens) Diarrhea   Iron Sucrose Other (See Comments)     Dizziness, Pt became flushed, hot and diaphoretic after completion of infusion.      Nutricap [Actical] Diarrhea   Pedi-Pre Tape Spray [Wound Dressing Adhesive] Hives   Wound Dressings Hives   Anise Extract [Flavoring Agent (Non-Screening)] Hives and Rash    Vanilla Extract     Outpatient Medications Prior to Visit  Medication Sig Dispense Refill   EPINEPHrine  (PRIMATENE  MIST) 0.125 MG/ACT AERO Inhale 1 puff into the lungs daily as needed (shortness of breath).     EPINEPHrine  0.3 mg/0.3 mL IJ SOAJ injection Inject 0.3 mg into the muscle as needed for anaphylaxis. 2 each 5   Fluticasone -Umeclidin-Vilant  (TRELEGY ELLIPTA ) 200-62.5-25 MCG/ACT AEPB Inhale 1 puff into the lungs daily. Rinse mouth after use. 60 each 8   pantoprazole  (PROTONIX ) 40 MG tablet Take 1 tablet (40 mg total) by mouth daily. 30 tablet 0   PARoxetine  (PAXIL -CR) 25 MG 24 hr tablet Take 1 tablet (25 mg total) by mouth every morning. 30 tablet 0   predniSONE  (DELTASONE ) 50 MG tablet Take 1 tablet (50 mg total) by mouth daily. 5 tablet 0   Vitamin D , Ergocalciferol , (DRISDOL ) 1.25 MG (50000 UNIT) CAPS capsule Take 1 capsule (50,000 Units total) by mouth every 7 (seven) days. 12 capsule 0   albuterol  (PROVENTIL ) (2.5 MG/3ML) 0.083% nebulizer solution Take 3 mLs by nebulization every 6 (six) hours as needed for wheezing. Contact Doctor for more refills. 90 mL 3   albuterol  (VENTOLIN  HFA) 108 (90 Base) MCG/ACT inhaler Inhale 2 puffs into the lungs every 4 (four) hours as needed for wheezing or shortness of breath.  18 g 3   No facility-administered medications prior to visit.    Review of Systems  Constitutional:  Negative for chills, diaphoresis, fever, malaise/fatigue and weight loss.  HENT:  Negative for congestion.   Respiratory:  Positive for cough, shortness of breath and wheezing. Negative for hemoptysis and sputum production.   Cardiovascular:  Negative for chest pain, palpitations and leg swelling.     Objective:   Vitals:   01/20/25 0945  BP: 128/66  Pulse: 80  Resp: (!) 24  Temp: 98.1 F (36.7 C)  SpO2: 97%     SpO2: 97 %  Physical Exam: General: Well-appearing, no acute distress HENT: [place], AT Eyes: EOMI, no scleral icterus Respiratory: Clear to auscultation bilaterally.  No crackles, wheezing or rales Cardiovascular: RRR, -M/R/G, no JVD Extremities:-Edema,-tenderness Neuro: AAO x4, CNII-XII grossly intact Psych: Normal mood, normal affect  Data Reviewed:  Imaging: CXR 04/12/2022-no acute infiltrate CXR 08/31/24 - Borderline heart size without suspicious new consolidation or pulmonary nodule.  Suggestion of bronchial wall thickening. CT Chest 08/31/24 - Mosaic attenuation throughout the pulmonary parenchyma, likely related to multifocal air trapping related to small airways disease. Stable 7 mm subpleural pulmonary nodule within the right lower lobe. A patient without a history of malignancy of this age, this is most likely post infectious or post inflammatory in nature. CXR 11/10/24 - no acute findings  PFT: 04/04/24 FVC 2.86 (83%) FEV1 2.09 (72%) Ratio 73  TLC 105% RV 135% DLCO 138% Interpretation: Mild obstructive defect with mild air trapping and mildly increased gas exchange consistent with asthma  Labs: CBC    Component Value Date/Time   WBC 9.0 11/10/2024 0243   RBC 4.64 11/10/2024 0243   HGB 12.4 11/10/2024 0243   HGB 12.6 11/01/2024 1452   HGB 12.1 04/04/2024 1406   HCT 38.7 11/10/2024 0243   HCT 37.8 04/04/2024 1406   PLT 241 11/10/2024 0243   PLT 235 11/01/2024 1452   PLT 259 04/04/2024 1406   MCV 83.4 11/10/2024 0243   MCV 80 04/04/2024 1406   MCH 26.7 11/10/2024 0243   MCHC 32.0 11/10/2024 0243   RDW 14.7 11/10/2024 0243   RDW 14.1 04/04/2024 1406   LYMPHSABS 1.7 07/13/2024 1104   LYMPHSABS 1.9 04/04/2024 1406   MONOABS 0.4 07/13/2024 1104   EOSABS 0.3 07/13/2024 1104   EOSABS 0.3 04/04/2024 1406   BASOSABS 0.0 07/13/2024 1104   BASOSABS 0.0 04/04/2024 1406   Absolute eos  07/13/24 - 300    Assessment & Plan:   Discussion: 34 year old female with childhood/adult asthma, hx Guillain Barre syndrome in 2017, ADD, anxiety/depression who presents for follow-up. Reviewed hospital history and prior clinic notes. Not in exacerbation. Suboptimal control on Xolair  and high dose triple therapy.   Anaphylaxis due to albuterol  - resolved --Followed by Allergy at [facility]. Recommended trial of levabuterol --Patient hospitalized in September and tolerated albuterol  without adverse effect or reaction --Albuterol  allergy has been removed from allergy list --Patient  denies any allergic reactions such as pruritus, dyspnea, or anaphylaxis with use of Albuterol . Would like to proceed with Albuterol  as prescribed despite [facility] recommendations of trying levalbuterol  vs albuterol . Educated on when to seek emergency care for allergic reaction.   Severe persistent asthma  --CONTINUE Xolair  twice a month for now. Will notifiy [facility] once enrolled in Nucala. Managed by [facility]. --CONTINUE Trelegy 200 ONE puff ONCE a day --CONTINUE zyrtec  10 mg daily --CONTINUE Albuterol  HFA AS NEEDED for shortness or breath or wheezing --CONTINUE Albuterol  nebulizer AS  NEEDED for shortness or breath or wheezing. --Will ENROLL in Nucala. Educated and counseled on different biologics including Nucala, Fasenra, Dupixent, and Nucala. Patient opted for Nucala.  Osteoporosis prevention secondary to chronic steroid use --Recommend Calcium 1000-1200 mg daily and vitamin D  600-800 units daily through diet or supplements   Health Maintenance Immunization History  Administered Date(s) Administered   Influenza Split 09/25/2011   MMR 11/04/2019    Orders Placed This Encounter  Procedures   CT Chest Wo Contrast    Standing Status:   Future    Expiration Date:   01/20/2026    Scheduling Instructions:     March 2026    Is patient pregnant?:   No    Preferred imaging location?:   [facility]   Ambulatory referral to Pharmacotherapy Clinic    Referral Priority:   Routine    Referral Type:   Consultation    Referral Reason:   Specialty Services Required    Number of Visits Requested:   1   Meds ordered this encounter  Medications   albuterol  (VENTOLIN  HFA) 108 (90 Base) MCG/ACT inhaler    Sig: Inhale 2 puffs into the lungs every 4 (four) hours as needed for wheezing or shortness of breath.    Dispense:  18 g    Refill:  5   ipratropium-albuterol  (DUONEB) 0.5-2.5 (3) MG/3ML SOLN    Sig: Take 3 mLs by nebulization every 6 (six) hours as needed.    Dispense:  360 mL    Refill:  5     Return in about 3 months (around 04/20/2025).  I have spent a total time of 40-minutes on the day of the appointment including chart review, data review, collecting history, coordinating care and discussing medical diagnosis and plan with the patient/family. Past medical history, allergies, medications were reviewed. Pertinent imaging, labs and tests included in this note have been reviewed and interpreted independently by me.  Meade Leos, NP Student  [facility] 01/20/2025 12:08 PM   Patient seen and examined, note reviewed with the signed Advanced Practice Provider. I personally reviewed laboratory data, imaging studies and relevant notes. I independently examined the patient and formulated the important aspects of the plan. Comments or changes to the note/plan are indicated below.    34 year old female who presents for asthma follow-up.   Discussed biologic agents including Xolair  (anti-IgE), Nucala (anti-IL-5), Fasenra (anti-IL-5 receptor alpha) and Dupixent (anti-IL-4 receptor subunit alpha). I believe patient would benefit from biologic agent given uncontrolled symptoms, multiple exacerbations and peripheral eosinophilia. We discussed the risks and benefits of these type of medications including anaphylaxis. Patient expressed understanding and would like to pursue treatment if eligible. Decided on Nucala. Message sent to pharmacy.   Slater Staff, M.D. [facility] 01/20/2025 12:11 PM   Please see Amion for pager number to reach on-call Pulmonary and Critical Care Team.   "

## 2025-01-20 NOTE — Telephone Encounter (Signed)
 Received referral for new start NUCALA. Opening benefits investigation in this thread.

## 2025-01-23 ENCOUNTER — Other Ambulatory Visit (HOSPITAL_COMMUNITY): Payer: Self-pay

## 2025-01-23 NOTE — Telephone Encounter (Signed)
 Submitted a Prior Authorization request to [facility] MEDICAID for NUCALA via CoverMyMeds. Will update once we receive a response.  Key: [id number]

## 2025-01-25 ENCOUNTER — Other Ambulatory Visit (HOSPITAL_COMMUNITY): Payer: Self-pay

## 2025-01-25 ENCOUNTER — Other Ambulatory Visit: Payer: Self-pay

## 2025-01-26 ENCOUNTER — Inpatient Hospital Stay: Attending: Oncology

## 2025-01-30 NOTE — Telephone Encounter (Signed)
 Received a fax regarding Prior Authorization from [facility] MEDICAID for NUCALA. Authorization has been DENIED because: Per your health plan's criteria, this drug is covered if you meet the following: If the request is for non-preferred drug, you have tried at least one preferred drug: Fasenra Pen / Syringe.  Phone# [phone]

## 2025-02-03 NOTE — Telephone Encounter (Signed)
 OK to change to Holly Nielsen. Please notify patient

## 2025-02-24 ENCOUNTER — Inpatient Hospital Stay: Attending: Oncology

## 2025-03-03 ENCOUNTER — Ambulatory Visit: Admitting: Cardiology

## 2025-03-17 ENCOUNTER — Inpatient Hospital Stay: Admitting: Oncology

## 2025-03-17 ENCOUNTER — Inpatient Hospital Stay

## 2025-04-19 ENCOUNTER — Ambulatory Visit (HOSPITAL_BASED_OUTPATIENT_CLINIC_OR_DEPARTMENT_OTHER): Admitting: Pulmonary Disease
